# Patient Record
Sex: Male | Born: 1960 | Race: White | Hispanic: No | Marital: Single | Smoking: Former smoker
Health system: Southern US, Community
[De-identification: ages and names within clinical notes are randomized; demographics above are authoritative.]

## PROBLEM LIST (undated history)

## (undated) DIAGNOSIS — I10 Essential (primary) hypertension: Secondary | ICD-10-CM

---

## 1997-12-15 ENCOUNTER — Emergency Department (HOSPITAL_COMMUNITY): Admission: EM | Admit: 1997-12-15 | Discharge: 1997-12-15 | Payer: Self-pay | Admitting: *Deleted

## 1998-02-15 ENCOUNTER — Emergency Department (HOSPITAL_COMMUNITY): Admission: EM | Admit: 1998-02-15 | Discharge: 1998-02-15 | Payer: Self-pay | Admitting: Emergency Medicine

## 1999-05-24 ENCOUNTER — Emergency Department (HOSPITAL_COMMUNITY): Admission: EM | Admit: 1999-05-24 | Discharge: 1999-05-24 | Payer: Self-pay | Admitting: Emergency Medicine

## 1999-05-24 ENCOUNTER — Encounter: Payer: Self-pay | Admitting: Emergency Medicine

## 2000-02-05 ENCOUNTER — Emergency Department (HOSPITAL_COMMUNITY): Admission: EM | Admit: 2000-02-05 | Discharge: 2000-02-05 | Payer: Self-pay | Admitting: Emergency Medicine

## 2000-02-05 ENCOUNTER — Encounter: Payer: Self-pay | Admitting: Emergency Medicine

## 2000-09-07 ENCOUNTER — Emergency Department (HOSPITAL_COMMUNITY): Admission: EM | Admit: 2000-09-07 | Discharge: 2000-09-07 | Payer: Self-pay | Admitting: Emergency Medicine

## 2000-09-07 ENCOUNTER — Encounter: Payer: Self-pay | Admitting: Emergency Medicine

## 2001-03-01 ENCOUNTER — Emergency Department (HOSPITAL_COMMUNITY): Admission: EM | Admit: 2001-03-01 | Discharge: 2001-03-01 | Payer: Self-pay | Admitting: *Deleted

## 2001-03-04 ENCOUNTER — Emergency Department (HOSPITAL_COMMUNITY): Admission: EM | Admit: 2001-03-04 | Discharge: 2001-03-04 | Payer: Self-pay | Admitting: Emergency Medicine

## 2006-12-17 ENCOUNTER — Emergency Department (HOSPITAL_COMMUNITY): Admission: EM | Admit: 2006-12-17 | Discharge: 2006-12-17 | Payer: Self-pay | Admitting: Emergency Medicine

## 2009-02-24 ENCOUNTER — Emergency Department (HOSPITAL_COMMUNITY): Admission: EM | Admit: 2009-02-24 | Discharge: 2009-02-24 | Payer: Self-pay | Admitting: Emergency Medicine

## 2011-05-05 LAB — CBC
HCT: 43.6
Hemoglobin: 14.9
MCHC: 34.2
MCV: 93.5
Platelets: 243
RBC: 4.66
RDW: 13
WBC: 8.9

## 2011-05-05 LAB — DIFFERENTIAL
Basophils Absolute: 0.1
Basophils Relative: 1
Eosinophils Absolute: 0.2
Eosinophils Relative: 2
Lymphocytes Relative: 30
Lymphs Abs: 2.7
Monocytes Absolute: 0.8 — ABNORMAL HIGH
Monocytes Relative: 9
Neutro Abs: 5.2
Neutrophils Relative %: 58

## 2011-05-05 LAB — BASIC METABOLIC PANEL
BUN: 11
CO2: 29
Calcium: 9
Chloride: 104
Creatinine, Ser: 0.64
GFR calc Af Amer: >60
GFR calc non Af Amer: >60
Glucose, Bld: 101 — ABNORMAL HIGH
Potassium: 4.4
Sodium: 139

## 2012-03-07 ENCOUNTER — Emergency Department (HOSPITAL_COMMUNITY)
Admission: EM | Admit: 2012-03-07 | Discharge: 2012-03-07 | Disposition: A | Discharge status: Home / Self Care | Payer: Self-pay | Attending: Emergency Medicine | Admitting: Emergency Medicine

## 2012-03-07 ENCOUNTER — Encounter (HOSPITAL_COMMUNITY): Payer: Self-pay | Admitting: Emergency Medicine

## 2012-03-07 DIAGNOSIS — Z4801 Encounter for change or removal of surgical wound dressing: Secondary | ICD-10-CM | POA: Insufficient documentation

## 2012-03-07 DIAGNOSIS — L0291 Cutaneous abscess, unspecified: Secondary | ICD-10-CM

## 2012-03-07 DIAGNOSIS — F17200 Nicotine dependence, unspecified, uncomplicated: Secondary | ICD-10-CM | POA: Insufficient documentation

## 2012-03-07 MED ORDER — SULFAMETHOXAZOLE-TRIMETHOPRIM 800-160 MG PO TABS: 1.0000 | ORAL_TABLET | Freq: Two times a day (BID) | ORAL | Status: AC

## 2012-03-07 NOTE — ED Notes (Signed)
Pt alert, arrives from home, c/o wound to left FA, onset was several days ago, 3cm raised area, two blacken centers, non open non draining, DSd applied

## 2012-03-07 NOTE — ED Provider Notes (Signed)
History     CSN: [id number]  Arrival date & time 03/07/12  2052   First MD Initiated Contact with Patient 03/07/12 2316      Chief Complaint  Patient presents with  . Wound Check    (Consider location/radiation/quality/duration/timing/severity/associated sxs/prior treatment) HPI Comments: Patient presents with left forearm infection/abscess that began 7 days ago. Patient states that he thinks he was bitten by an insect because he felt a sting at the onset. He did not see a spider or other insect. Patient states that the arm became swollen and tender. He notes that the swelling has improved over the past few days. He denies fever, nausea or vomiting. He states he has been squeezing pus from the area. He has been washing it in warm water in the shower. He is worried that this is a brown recluse bite. No other treatments prior. Onset was acute. Course is gradually improving. Nothing makes symptoms better or worse.  The history is provided by the patient.    History reviewed. No pertinent past medical history.  History reviewed. No pertinent past surgical history.  No family history on file.  History  Substance Use Topics  . Smoking status: Current Everyday Smoker -- 1.0 packs/day    Types: Cigarettes  . Smokeless tobacco: Not on file  . Alcohol Use: No      Review of Systems  Constitutional: Negative for fever.  Gastrointestinal: Negative for nausea and vomiting.  Skin: Negative for color change.       Positive for abscess  Hematological: Negative for adenopathy.    Allergies  Codeine  Home Medications  No current outpatient prescriptions on file.  BP 114/80  Pulse 81  Temp 98.4 F (36.9 C) (Oral)  Resp 18  SpO2 96%  Physical Exam  Nursing note and vitals reviewed. Constitutional: He appears well-developed and well-nourished.  HENT:  Head: Normocephalic and atraumatic.  Eyes: Conjunctivae are normal.  Neck: Normal range of motion. Neck supple.    Cardiovascular:  Pulses:      Radial pulses are 2+ on the right side, and 2+ on the left side.  Pulmonary/Chest: No respiratory distress.  Musculoskeletal:       Normal sensation and movement in left wrist and hand.  Neurological: He is alert.  Skin: Skin is warm and dry.       3 cm area and induration to the left forearm with mild overlying redness but no surrounding redness that would be consistent with cellulitis. There is crusting but no drainage. Area is mildly tender to palpation. There is  eschar in the center.  Psychiatric: He has a normal mood and affect.    ED Course  Procedures (including critical care time)  Labs Reviewed - No data to display No results found.   1. Abscess     11:36 PM Patient seen and examined.    Vital signs reviewed and are as follows: Filed Vitals:   03/07/12 2312  BP: 114/80  Pulse: 81  Temp: 98.4 F (36.9 C)  Resp: 18   11:36 PM The patient was urged to return to the Emergency Department urgently with worsening pain, swelling, expanding erythema especially if it streaks away from the affected area, fever, or if they have any other concerns. Patient verbalized understanding.     MDM  Abscess already draining, does not feel fluctuant. No cellulitis. No systemic sx. Will treat with course of bactrim. No cytotoxic response noted.         Renne Crigler,  PA 03/07/12 2343

## 2012-03-08 NOTE — ED Provider Notes (Signed)
Medical screening examination/treatment/procedure(s) were performed by non-physician practitioner and as supervising physician I was immediately available for consultation/collaboration.  Andry Bogden, MD 03/08/12 0908 

## 2012-07-16 ENCOUNTER — Emergency Department (HOSPITAL_COMMUNITY): Payer: Self-pay

## 2012-07-16 ENCOUNTER — Encounter (HOSPITAL_COMMUNITY): Payer: Self-pay | Admitting: *Deleted

## 2012-07-16 ENCOUNTER — Other Ambulatory Visit: Payer: Self-pay

## 2012-07-16 ENCOUNTER — Inpatient Hospital Stay (HOSPITAL_COMMUNITY)
Admission: EM | Admit: 2012-07-16 | Discharge: 2012-07-22 | DRG: 194 | Disposition: A | Discharge status: Home / Self Care | Payer: MEDICAID | Attending: Internal Medicine | Admitting: Internal Medicine

## 2012-07-16 DIAGNOSIS — F1210 Cannabis abuse, uncomplicated: Secondary | ICD-10-CM | POA: Diagnosis present

## 2012-07-16 DIAGNOSIS — E871 Hypo-osmolality and hyponatremia: Secondary | ICD-10-CM

## 2012-07-16 DIAGNOSIS — F10939 Alcohol use, unspecified with withdrawal, unspecified: Secondary | ICD-10-CM | POA: Diagnosis not present

## 2012-07-16 DIAGNOSIS — T510X4A Toxic effect of ethanol, undetermined, initial encounter: Secondary | ICD-10-CM | POA: Diagnosis present

## 2012-07-16 DIAGNOSIS — R51 Headache: Secondary | ICD-10-CM | POA: Diagnosis not present

## 2012-07-16 DIAGNOSIS — D6959 Other secondary thrombocytopenia: Secondary | ICD-10-CM | POA: Diagnosis present

## 2012-07-16 DIAGNOSIS — F17200 Nicotine dependence, unspecified, uncomplicated: Secondary | ICD-10-CM | POA: Diagnosis present

## 2012-07-16 DIAGNOSIS — R7881 Bacteremia: Secondary | ICD-10-CM | POA: Diagnosis present

## 2012-07-16 DIAGNOSIS — I10 Essential (primary) hypertension: Secondary | ICD-10-CM | POA: Diagnosis present

## 2012-07-16 DIAGNOSIS — J154 Pneumonia due to other streptococci: Principal | ICD-10-CM | POA: Diagnosis present

## 2012-07-16 DIAGNOSIS — IMO0001 Reserved for inherently not codable concepts without codable children: Secondary | ICD-10-CM | POA: Diagnosis present

## 2012-07-16 DIAGNOSIS — R079 Chest pain, unspecified: Secondary | ICD-10-CM

## 2012-07-16 DIAGNOSIS — F1010 Alcohol abuse, uncomplicated: Secondary | ICD-10-CM | POA: Diagnosis present

## 2012-07-16 DIAGNOSIS — B953 Streptococcus pneumoniae as the cause of diseases classified elsewhere: Secondary | ICD-10-CM

## 2012-07-16 DIAGNOSIS — F1410 Cocaine abuse, uncomplicated: Secondary | ICD-10-CM | POA: Diagnosis present

## 2012-07-16 DIAGNOSIS — T65891A Toxic effect of other specified substances, accidental (unintentional), initial encounter: Secondary | ICD-10-CM | POA: Diagnosis present

## 2012-07-16 DIAGNOSIS — F1020 Alcohol dependence, uncomplicated: Secondary | ICD-10-CM | POA: Diagnosis present

## 2012-07-16 DIAGNOSIS — F10239 Alcohol dependence with withdrawal, unspecified: Secondary | ICD-10-CM | POA: Diagnosis not present

## 2012-07-16 DIAGNOSIS — K5900 Constipation, unspecified: Secondary | ICD-10-CM | POA: Diagnosis not present

## 2012-07-16 DIAGNOSIS — J189 Pneumonia, unspecified organism: Secondary | ICD-10-CM

## 2012-07-16 DIAGNOSIS — R000 Tachycardia, unspecified: Secondary | ICD-10-CM | POA: Diagnosis present

## 2012-07-16 LAB — RAPID URINE DRUG SCREEN, HOSP PERFORMED
Barbiturates: NOT DETECTED
Benzodiazepines: NOT DETECTED
Cocaine: POSITIVE — AB
Opiates: NOT DETECTED
Tetrahydrocannabinol: POSITIVE — AB

## 2012-07-16 LAB — BASIC METABOLIC PANEL
BUN: 30 mg/dL — ABNORMAL HIGH (ref 6–23)
Creatinine, Ser: 0.93 mg/dL (ref 0.50–1.35)
GFR calc non Af Amer: >90 mL/min (ref >90)
Glucose, Bld: 126 mg/dL — ABNORMAL HIGH (ref 70–99)
Potassium: 3.8 mEq/L (ref 3.5–5.1)

## 2012-07-16 LAB — CBC
HCT: 40.6 % (ref 39.0–52.0)
Hemoglobin: 14.3 g/dL (ref 13.0–17.0)
MCHC: 35.2 g/dL (ref 30.0–36.0)
MCV: 92.3 fL (ref 78.0–100.0)

## 2012-07-16 LAB — CREATININE, URINE, RANDOM: Creatinine, Urine: 116.7 mg/dL

## 2012-07-16 LAB — HIV ANTIBODY (ROUTINE TESTING W REFLEX): HIV: NONREACTIVE

## 2012-07-16 LAB — OSMOLALITY, URINE: Osmolality, Ur: 780 mOsm/kg (ref 390–1090)

## 2012-07-16 MED ORDER — DEXTROSE 5 % IV SOLN
1.0000 g | INTRAVENOUS | Status: DC
  Administered 2012-07-17 – 2012-07-19 (×3): 1 g via INTRAVENOUS
  Filled 2012-07-16 (×3): qty 10

## 2012-07-16 MED ORDER — AZITHROMYCIN 500 MG PO TABS
500.0000 mg | ORAL_TABLET | ORAL | Status: DC
  Administered 2012-07-17 – 2012-07-18 (×2): 500 mg via ORAL
  Filled 2012-07-16 (×3): qty 1

## 2012-07-16 MED ORDER — IOHEXOL 350 MG/ML SOLN
100.0000 mL | Freq: Once | INTRAVENOUS | Status: AC | PRN
  Administered 2012-07-16: 100 mL via INTRAVENOUS

## 2012-07-16 MED ORDER — NICOTINE 14 MG/24HR TD PT24
14.0000 mg | MEDICATED_PATCH | Freq: Every day | TRANSDERMAL | Status: DC
  Administered 2012-07-16 – 2012-07-21 (×6): 14 mg via TRANSDERMAL
  Filled 2012-07-16 (×7): qty 1

## 2012-07-16 MED ORDER — HYDROMORPHONE HCL PF 1 MG/ML IJ SOLN
1.0000 mg | Freq: Once | INTRAMUSCULAR | Status: AC
  Administered 2012-07-16: 1 mg via INTRAVENOUS
  Filled 2012-07-16: qty 1

## 2012-07-16 MED ORDER — VITAMIN B-1 100 MG PO TABS
100.0000 mg | ORAL_TABLET | Freq: Every day | ORAL | Status: DC
  Administered 2012-07-16 – 2012-07-22 (×7): 100 mg via ORAL
  Filled 2012-07-16 (×7): qty 1

## 2012-07-16 MED ORDER — PNEUMOCOCCAL VAC POLYVALENT 25 MCG/0.5ML IJ INJ
0.5000 mL | INJECTION | Freq: Once | INTRAMUSCULAR | Status: AC
  Administered 2012-07-16: 0.5 mL via INTRAMUSCULAR
  Filled 2012-07-16: qty 0.5

## 2012-07-16 MED ORDER — TRAMADOL HCL 50 MG PO TABS
100.0000 mg | ORAL_TABLET | Freq: Four times a day (QID) | ORAL | Status: DC | PRN
  Administered 2012-07-16 – 2012-07-21 (×2): 100 mg via ORAL
  Filled 2012-07-16: qty 1
  Filled 2012-07-16: qty 2
  Filled 2012-07-16: qty 1

## 2012-07-16 MED ORDER — DEXTROSE 5 % IV SOLN
1.0000 g | Freq: Once | INTRAVENOUS | Status: AC
  Administered 2012-07-16: 1 g via INTRAVENOUS
  Filled 2012-07-16: qty 10

## 2012-07-16 MED ORDER — SODIUM CHLORIDE 0.9 % IV SOLN
INTRAVENOUS | Status: DC
  Administered 2012-07-16: 06:00:00 via INTRAVENOUS
  Administered 2012-07-16: 100 mL/h via INTRAVENOUS
  Administered 2012-07-17: 09:00:00 via INTRAVENOUS
  Administered 2012-07-18: 20 mL/h via INTRAVENOUS
  Administered 2012-07-18: 06:00:00 via INTRAVENOUS
  Administered 2012-07-19: 20 mL/h via INTRAVENOUS

## 2012-07-16 MED ORDER — INFLUENZA VIRUS VACC SPLIT PF IM SUSP
0.5000 mL | Freq: Once | INTRAMUSCULAR | Status: AC
  Administered 2012-07-16: 0.5 mL via INTRAMUSCULAR
  Filled 2012-07-16: qty 0.5

## 2012-07-16 MED ORDER — HYDROMORPHONE HCL PF 1 MG/ML IJ SOLN
1.0000 mg | INTRAMUSCULAR | Status: DC | PRN
  Administered 2012-07-16 – 2012-07-18 (×4): 1 mg via INTRAVENOUS
  Filled 2012-07-16 (×4): qty 1

## 2012-07-16 MED ORDER — KETOROLAC TROMETHAMINE 30 MG/ML IJ SOLN
30.0000 mg | Freq: Three times a day (TID) | INTRAMUSCULAR | Status: DC
  Administered 2012-07-16 – 2012-07-19 (×9): 30 mg via INTRAVENOUS
  Filled 2012-07-16 (×15): qty 1

## 2012-07-16 MED ORDER — ENOXAPARIN SODIUM 40 MG/0.4ML ~~LOC~~ SOLN
40.0000 mg | SUBCUTANEOUS | Status: DC
  Administered 2012-07-16 – 2012-07-22 (×7): 40 mg via SUBCUTANEOUS
  Filled 2012-07-16 (×7): qty 0.4

## 2012-07-16 MED ORDER — ONDANSETRON HCL 4 MG/2ML IJ SOLN
4.0000 mg | Freq: Once | INTRAMUSCULAR | Status: AC
  Administered 2012-07-16: 4 mg via INTRAVENOUS
  Filled 2012-07-16: qty 2

## 2012-07-16 MED ORDER — DEXTROSE 5 % IV SOLN
500.0000 mg | Freq: Once | INTRAVENOUS | Status: AC
  Administered 2012-07-16: 500 mg via INTRAVENOUS
  Filled 2012-07-16: qty 500

## 2012-07-16 MED ORDER — FOLIC ACID 1 MG PO TABS
1.0000 mg | ORAL_TABLET | Freq: Every day | ORAL | Status: DC
  Administered 2012-07-16 – 2012-07-22 (×7): 1 mg via ORAL
  Filled 2012-07-16 (×7): qty 1

## 2012-07-16 NOTE — ED Provider Notes (Signed)
History     CSN: [id number]  Arrival date & time 07/16/12  0108   First MD Initiated Contact with Patient 07/16/12 0121      Chief Complaint  Patient presents with  . Shortness of Breath    (Consider location/radiation/quality/duration/timing/severity/associated sxs/prior treatment) Patient is a 51 y.o. male presenting with shortness of breath.  Shortness of Breath  Associated symptoms include shortness of breath.  Christopher Logan is a 51 y.o. male presenting with severe left sided chest pain worse on deep breathing that began yesterday and has gotten worse.  Began with cough. Has had cough productive of white to yellow sputum.  Chills.  Denies dyspnea.  No abdominal pain, nausea, vomiting or diarrhea. No headache.  History reviewed. No pertinent past medical history.  History reviewed. No pertinent past surgical history.  Family History  Problem Relation Age of Onset  . Cancer Mother     History  Substance Use Topics  . Smoking status: Current Every Day Smoker -- 1.0 packs/day    Types: Cigarettes  . Smokeless tobacco: Not on file  . Alcohol Use: Yes     Comment: 40 oz of beer a day.       Review of Systems  Respiratory: Positive for shortness of breath.    At least 10pt or greater review of systems completed and are negative except where specified in the HPI.  Allergies  Codeine  Home Medications  No current outpatient prescriptions on file.  BP 139/85  Pulse 92  Temp 98.9 F (37.2 C) (Oral)  Resp 20  Ht 5\' 10"  (1.778 m)  Wt 130 lb 1.1 oz (59 kg)  BMI 18.66 kg/m2  SpO2 97%  Physical Exam  Nursing notes reviewed.  Electronic medical record reviewed. VITAL SIGNS:   Filed Vitals:   07/16/12 0309 07/16/12 0528 07/16/12 1437 07/16/12 2033  BP: 132/81 143/94 122/81 139/85  Pulse: 101 106 86 92  Temp: 98.5 F (36.9 C)  98.3 F (36.8 C) 98.9 F (37.2 C)  TempSrc: Oral Oral Oral Oral  Resp: 26 20 20 20   Height:  5\' 10"  (1.778 m)    Weight:  130 lb  1.1 oz (59 kg)    SpO2: 98% 96% 98% 97%   CONSTITUTIONAL: Awake, oriented, appears non-toxic HENT: Atraumatic, normocephalic, oral mucosa pink and moist, airway patent. Nares patent without drainage. External ears normal. EYES: Conjunctiva clear, EOMI, PERRLA NECK: Trachea midline, non-tender, supple CARDIOVASCULAR: Normal heart rate, Normal rhythm, No murmurs, rubs, gallops PULMONARY/CHEST: Clear to auscultation, no rhonchi, wheezes, or rales. Symmetrical breath sounds. Non-tender. ABDOMINAL: Non-distended, soft, non-tender - no rebound or guarding.  BS normal. NEUROLOGIC: Non-focal, moving all four extremities, no gross sensory or motor deficits. EXTREMITIES: No clubbing, cyanosis, or edema SKIN: Warm, Dry, No erythema, No rash  ED Course  [place] bedside Performed by: Jones Skene Authorized by: Jones Skene Consent: Verbal consent obtained. Comments: SSliding lung sign seen on both sided of chest in mid-clavicular line at the 2nd rib.  M-modes shows "sand and surf" sign. No PTX   (including critical care time)  Labs Reviewed  CBC - Abnormal; Notable for the following:    WBC 15.7 (*)     All other components within normal limits  BASIC METABOLIC PANEL - Abnormal; Notable for the following:    Sodium 131 (*)     Chloride 95 (*)     Glucose, Bld 126 (*)     BUN 30 (*)     All other components  within normal limits  STREP PNEUMONIAE URINARY ANTIGEN - Abnormal; Notable for the following:    Strep Pneumo Urinary Antigen POSITIVE (*)     All other components within normal limits  URINE RAPID DRUG SCREEN (HOSP PERFORMED) - Abnormal; Notable for the following:    Cocaine POSITIVE (*)     Tetrahydrocannabinol POSITIVE (*)     All other components within normal limits  TROPONIN I  TROPONIN I  SODIUM, URINE, RANDOM  CREATININE, URINE, RANDOM  OSMOLALITY, URINE  HIV ANTIBODY (ROUTINE TESTING)  CULTURE, BLOOD (ROUTINE X 2)  CULTURE, BLOOD (ROUTINE X 2)  CULTURE, EXPECTORATED  SPUTUM-ASSESSMENT  GRAM STAIN  LEGIONELLA ANTIGEN, URINE  COMPREHENSIVE METABOLIC PANEL  CBC WITH DIFFERENTIAL   Ct Angio Chest W/cm &/or Wo Cm  07/16/2012  *RADIOLOGY REPORT*  Clinical Data: Sudden onset of left-sided chest pain.  Cough. Leukocytosis.  History of smoking.  CT ANGIOGRAPHY CHEST  Technique:  Multidetector CT imaging of the chest using the standard protocol during bolus administration of intravenous contrast. Multiplanar reconstructed images including MIPs were obtained and reviewed to evaluate the vascular anatomy.  Contrast: OMNIPAQUE IOHEXOL 350 MG/ML SOLN  Comparison: Chest radiograph performed earlier today at 01:28 a.m.  Findings: There is no evidence of pulmonary embolus.  There is focal airspace opacification involving the posterior aspect of the left upper lobe, with minimal patchy opacity at the left lower lobe, compatible with multifocal pneumonia.  Minimal right basilar atelectasis is seen.  There is no evidence of pleural effusion or pneumothorax.  No masses are identified; no abnormal focal contrast enhancement is seen.  Scattered coronary artery calcification is noted.  Vague soft tissue density is noted in the subcarinal region, measuring 1.2 cm in short axis.  A 1.1 cm aortopulmonary window node is seen. Additional scattered mediastinal nodes remain borderline normal in size. No pericardial effusion is identified.  The great vessels demonstrate mild scattered calcification, but are otherwise grossly unremarkable.  No axillary lymphadenopathy is seen.  The visualized portions of the thyroid gland are unremarkable in appearance.  The visualized portions of the liver and spleen are unremarkable.  No acute osseous abnormalities are seen.  IMPRESSION:  1.  No evidence of pulmonary embolus. 2.  Focal left upper lobe and minimal left lower lobe pneumonia. 3.  Minimal right basilar atelectasis seen. 4.  Scattered coronary artery calcification noted. 5.  Vague soft tissue density  at the subcarinal region, measuring 1.2 cm in short axis, and 1.1 cm aortopulmonary window node.  This may reflect the acute infectious process, but remains nonspecific.   Original Report Authenticated By: Tonia Ghent, M.D.    Dg Chest Portable 1 View  07/16/2012  *RADIOLOGY REPORT*  Clinical Data: Shortness of breath.  Chest pain.  PORTABLE CHEST - 1 VIEW  Comparison: None.  Findings: Asymmetric airspace opacity is seen in the peripheral left midlung.  Differential diagnosis includes pneumonia, hemorrhage, and infarct.  Right lung is clear.  No evidence of pleural effusion.  Heart size is normal.  IMPRESSION:  Left mid lung airspace opacity.  Differential diagnosis includes pneumonia, hemorrhage, and infarct.   Original Report Authenticated By: Myles Rosenthal, M.D.      1. CAP (community acquired pneumonia)   2. Chest pain   3. Hyponatremia       MDM  Christopher Logan is a 52 y.o. male presents with apparent PNA. PTX ruled out on physical and with [place].  Pt had possible infarct on CXR  -concern for PE also,  CT showed no PE but left lung PNA.  Therapy started for CAP.  Pt meets SIRS criteria - concern for effective therapy as an OP given smoking etc - admitted to [facility] for CAP        Jones Skene, MD 07/16/12 2330

## 2012-07-16 NOTE — Progress Notes (Signed)
ANTIBIOTIC CONSULT NOTE - INITIAL  Pharmacy Consult for antibiotic renal monitoring Indication: pneumonia  Allergies  Allergen Reactions  . Codeine Nausea Only    Patient Measurements: Height: 5\' 10"  (177.8 cm) Weight: 130 lb 1.1 oz (59 kg) IBW/kg (Calculated) : 73  Adjusted Body Weight:   Vital Signs: Temp: 98 F (36.7 C) (12/30 0528) Temp src: Oral (12/30 0528) BP: 143/94 mmHg (12/30 0528) Pulse Rate: 106  (12/30 0528) Intake/Output from previous day:   Intake/Output from this shift:    Labs:  [facility] 07/16/12 0123  WBC 15.7*  HGB 14.3  PLT 181  LABCREA --  CREATININE 0.93   Estimated Creatinine Clearance: 79.3 ml/min (by C-G formula based on Cr of 0.93). No results found for this basename: VANCOTROUGH:2,VANCOPEAK:2,VANCORANDOM:2,GENTTROUGH:2,GENTPEAK:2,GENTRANDOM:2,TOBRATROUGH:2,TOBRAPEAK:2,TOBRARND:2,AMIKACINPEAK:2,AMIKACINTROU:2,AMIKACIN:2, in the last 72 hours   Microbiology: No results found for this or any previous visit (from the past 720 hour(s)).  Medical History: History reviewed. No pertinent past medical history.  Medications:  Anti-infectives     Start     Dose/Rate Route Frequency Ordered Stop   07/17/12 0600   cefTRIAXone (ROCEPHIN) 1 g in dextrose 5 % 50 mL IVPB        1 g 100 mL/hr over 30 Minutes Intravenous Every 24 hours 07/16/12 0520 07/24/12 0559   07/17/12 0600   azithromycin (ZITHROMAX) tablet 500 mg        500 mg Oral Every 24 hours 07/16/12 0520 07/24/12 0559   07/16/12 0315   cefTRIAXone (ROCEPHIN) 1 g in dextrose 5 % 50 mL IVPB        1 g 100 mL/hr over 30 Minutes Intravenous  Once 07/16/12 0301 07/16/12 0502   07/16/12 0315   azithromycin (ZITHROMAX) 500 mg in dextrose 5 % 250 mL IVPB        500 mg 250 mL/hr over 60 Minutes Intravenous  Once 07/16/12 0301 07/16/12 0601         Assessment: Patient with good renal function ~84mL/min, on correctly dosed rocephin/azithromycin.  Goal of Therapy:  Rocephin/azithromycin  based on manufacturer dosing recommendations.   Plan:  Follow up culture results Continue with current abx dosing  Aleene Davidson Crowford 07/16/2012,6:11 AM

## 2012-07-16 NOTE — ED Notes (Signed)
Pt presents clinching left chest; yelling out in pain; states has had cough/cold since yesterday; developed sudden onset severe left chest pain two hours prior to arrival; sats 90% on arrival;

## 2012-07-16 NOTE — H&P (Signed)
PCP:  None   Chief Complaint:  Chest pain  HPI: Christopher Logan is a 51 y.o. male   has no past medical history on file.   Presented with  1 day hx of pleuritic chest pain and cough (non-productive). He presented to ED with chest pain and was found to have PNA. Patient denies any fevers. He denies sick contacts. Reports using cocaine 3 days ago. He reports drinking 40 Oz of beer a day but denies any hx of EtOH withdrawal, denies ever being tremulous if he does not drink.  Review of Systems:     Pertinent positives include: chest pain, shortness of breath at rest,  non-productive cough, nausea, vomiting,   Constitutional:  No weight loss, night sweats, Fevers, chills, fatigue, weight loss  HEENT:  No headaches, Difficulty swallowing,Tooth/dental problems,Sore throat,  No sneezing, itching, ear ache, nasal congestion, post nasal drip,  Cardio-vascular:  NoOrthopnea, PND, anasarca, dizziness, palpitations.no Bilateral lower extremity swelling  GI:  No heartburn, indigestion, abdominal pain, diarrhea, change in bowel habits, loss of appetite, melena, blood in stool, hematemesis Resp:  no . No dyspnea on exertion, No excess mucus, No coughing up of blood.No change in color of mucus.No wheezing. Skin:  no rash or lesions. No jaundice GU:  no dysuria, change in color of urine, no urgency or frequency. No straining to urinate.  No flank pain.  Musculoskeletal:  No joint pain or no joint swelling. No decreased range of motion. No back pain.  Psych:  No change in mood or affect. No depression or anxiety. No memory loss.  Neuro: no localizing neurological complaints, no tingling, no weakness, no double vision, no gait abnormality, no slurred speech, no confusion  Otherwise ROS are negative except for above, 10 systems were reviewed  Past Medical History: History reviewed. No pertinent past medical history. History reviewed. No pertinent past surgical history.   Medications: Prior  to Admission medications   Medication Sig Start Date End Date Taking? Authorizing Provider  Aspirin-Acetaminophen (GOODYS BODY PAIN PO) Take 1 Package by mouth daily as needed. For pain   Yes Historical Provider, MD    Allergies:   Allergies  Allergen Reactions  . Codeine Nausea Only    Social History:  Ambulatory  Independently  Lives at  home   reports that he has been smoking Cigarettes.  He has been smoking about 1 pack per day. He does not have any smokeless tobacco history on file. He reports that he drinks alcohol. He reports that he uses illicit drugs (Marijuana and Cocaine).   Family History: family history includes Cancer in his mother.    Physical Exam: Patient Vitals for the past 24 hrs:  BP Temp Temp src Pulse Resp SpO2  07/16/12 0309 132/81 mmHg 98.5 F (36.9 C) Oral 101  26  98 %  07/16/12 0110 151/98 mmHg 98.1 F (36.7 C) - 113  24  90 %    1. General:  in No Acute distress 2. Psychological: Alert and   Oriented 3. Head/ENT:   Moist  Mucous Membranes                          Head Non traumatic, neck supple                          Normal   Dentition 4. SKIN:  decreased Skin turgor,  Skin clean Dry and intact no rash 5. Heart:  Regular rate and rhythm no Murmur, Rub or gallop 6. Lungs:  no wheezes or crackles  But patient unable to inhale deeply due to pain. 7. Abdomen: Soft, non-tender, Non distended 8. Lower extremities: no clubbing, cyanosis, or edema 9. Neurologically Grossly intact, moving all 4 extremities equally 10. MSK: Normal range of motion  body mass index is unknown because there is no height or weight on file.   Labs on Admission:   [facility] 07/16/12 0123  NA 131*  K 3.8  CL 95*  CO2 25  GLUCOSE 126*  BUN 30*  CREATININE 0.93  CALCIUM 9.0  MG --  PHOS --   No results found for this basename: AST:2,ALT:2,ALKPHOS:2,BILITOT:2,PROT:2,ALBUMIN:2 in the last 72 hours No results found for this basename: LIPASE:2,AMYLASE:2 in the last  72 hours  Basename 07/16/12 0123  WBC 15.7*  NEUTROABS --  HGB 14.3  HCT 40.6  MCV 92.3  PLT 181    Basename 07/16/12 0123  CKTOTAL --  CKMB --  CKMBINDEX --  TROPONINI <0.30   No results found for this basename: TSH,T4TOTAL,FREET3,T3FREE,THYROIDAB in the last 72 hours No results found for this basename: VITAMINB12:2,FOLATE:2,FERRITIN:2,TIBC:2,IRON:2,RETICCTPCT:2 in the last 72 hours No results found for this basename: HGBA1C    CrCl is unknown because there is no height on file for the current visit. ABG No results found for this basename: phart, pco2, po2, hco3, tco2, acidbasedef, o2sat     No results found for this basename: DDIMER     Other results:  I have pearsonaly reviewed this: ECG REPORT  Rate: 105  Rhythm: sinus tachycardia ST&T Change: no ischemic changes   Cultures: No results found for this basename: sdes, specrequest, cult, reptstatus       Radiological Exams on Admission: Ct Angio Chest W/cm &/or Wo Cm  07/16/2012  *RADIOLOGY REPORT*  Clinical Data: Sudden onset of left-sided chest pain.  Cough. Leukocytosis.  History of smoking.  CT ANGIOGRAPHY CHEST  Technique:  Multidetector CT imaging of the chest using the standard protocol during bolus administration of intravenous contrast. Multiplanar reconstructed images including MIPs were obtained and reviewed to evaluate the vascular anatomy.  Contrast: OMNIPAQUE IOHEXOL 350 MG/ML SOLN  Comparison: Chest radiograph performed earlier today at 01:28 a.m.  Findings: There is no evidence of pulmonary embolus.  There is focal airspace opacification involving the posterior aspect of the left upper lobe, with minimal patchy opacity at the left lower lobe, compatible with multifocal pneumonia.  Minimal right basilar atelectasis is seen.  There is no evidence of pleural effusion or pneumothorax.  No masses are identified; no abnormal focal contrast enhancement is seen.  Scattered coronary artery calcification is  noted.  Vague soft tissue density is noted in the subcarinal region, measuring 1.2 cm in short axis.  A 1.1 cm aortopulmonary window node is seen. Additional scattered mediastinal nodes remain borderline normal in size. No pericardial effusion is identified.  The great vessels demonstrate mild scattered calcification, but are otherwise grossly unremarkable.  No axillary lymphadenopathy is seen.  The visualized portions of the thyroid gland are unremarkable in appearance.  The visualized portions of the liver and spleen are unremarkable.  No acute osseous abnormalities are seen.  IMPRESSION:  1.  No evidence of pulmonary embolus. 2.  Focal left upper lobe and minimal left lower lobe pneumonia. 3.  Minimal right basilar atelectasis seen. 4.  Scattered coronary artery calcification noted. 5.  Vague soft tissue density at the subcarinal region, measuring 1.2 cm in short axis, and  1.1 cm aortopulmonary window node.  This may reflect the acute infectious process, but remains nonspecific.   Original Report Authenticated By: Tonia Ghent, M.D.    Dg Chest Portable 1 View  07/16/2012  *RADIOLOGY REPORT*  Clinical Data: Shortness of breath.  Chest pain.  PORTABLE CHEST - 1 VIEW  Comparison: None.  Findings: Asymmetric airspace opacity is seen in the peripheral left midlung.  Differential diagnosis includes pneumonia, hemorrhage, and infarct.  Right lung is clear.  No evidence of pleural effusion.  Heart size is normal.  IMPRESSION:  Left mid lung airspace opacity.  Differential diagnosis includes pneumonia, hemorrhage, and infarct.   Original Report Authenticated By: Myles Rosenthal, M.D.     Chart has been reviewed  Assessment/Plan  [age] yo M w hx of substance abuse here with pleuretic chest pain and evidence of PNA on CXR.  Present on Admission:  . CAP (community acquired pneumonia) -  - will admit for treatment of CAP will start on appropriate antibiotic coverage.   Obtain sputum cultures, blood cultures if  febrile or if decompensates.  Provide oxygen as needed.  HIV testing . Chest pain - -given risk factors will  monitor on telemetry, cycle cardiac enzymes, obtain serial ECG. Most likley chest pain is due to PNA.  Marland Kitchen Hyponatremia - likely in the setting of PNA, EtOH use and dehydration. Will obtain urine electrolytes and follow  Na.    Prophylaxis:Lovenox, Protonix  CODE STATUS: FULL CODE  Other plan as per orders.  I have spent a total of 55 min on this admission  Jeramie Scogin 07/16/2012, 3:42 AM

## 2012-07-16 NOTE — Progress Notes (Signed)
[facility] PROGRESS NOTE  DWIGHT ADAMCZAK JXB:[id number] DOB: 1960/08/21 DOA: 07/16/2012 PCP: No primary provider on file.  Assessment/Plan: 1. Pneumonia - complicating features on admission are chest pain, alcoholism, tobacco abuse,  hyponatremia , tachycardia, tachypnea. Patient started on iv rocephin and zithromax 2. Tobacco abuse - counseled 3. Alcohol abuse counseled . Started folic acid and thiamine on admission.  4. Pleuritic chest pain - suspect patient will develop para pneumonic effussion - f/u CXR closely. toradol iv for pain 5. HTN - from pain   6. Hyponatremia - probably siadh due to pain   Code Status: full  Family Communication: patient Disposition Plan: home   Consultants:  none  Procedures:  none  Antibiotics: Rocephin and Zithromax 12/30  HPI/Subjective: C/o severe pleuritic type chest pain   Objective: Filed Vitals:   07/16/12 0110 07/16/12 0309 07/16/12 0528  BP: 151/98 132/81 143/94  Pulse: 113 101 106  Temp: 98.1 F (36.7 C) 98.5 F (36.9 C)   TempSrc:  Oral Oral  Resp: 24 26 20   Height:   5\' 10"  (1.778 m)  Weight:   59 kg (130 lb 1.1 oz)  SpO2: 90% 98% 96%    Intake/Output Summary (Last 24 hours) at 07/16/12 1201 Last data filed at 07/16/12 1100  Gross per 24 hour  Intake    120 ml  Output    100 ml  Net     20 ml   Filed Weights   07/16/12 0528  Weight: 59 kg (130 lb 1.1 oz)    Exam:   General:  axox3  Cardiovascular: tachy, reg, no murmurs   Respiratory: decreased sounds left base, no wheezes, barrel shaped chest   Abdomen: soft, nt   Data Reviewed: Basic Metabolic Panel:  Lab 07/16/12 1308  NA 131*  K 3.8  CL 95*  CO2 25  GLUCOSE 126*  BUN 30*  CREATININE 0.93  CALCIUM 9.0  MG --  PHOS --   Liver Function Tests: No results found for this basename: AST:5,ALT:5,ALKPHOS:5,BILITOT:5,PROT:5,ALBUMIN:5 in the last 168 hours No results found for this basename: LIPASE:5,AMYLASE:5 in the last 168 hours No  results found for this basename: AMMONIA:5 in the last 168 hours CBC:  Lab 07/16/12 0123  WBC 15.7*  NEUTROABS --  HGB 14.3  HCT 40.6  MCV 92.3  PLT 181   Cardiac Enzymes:  Lab 07/16/12 0745 07/16/12 0123  CKTOTAL -- --  CKMB -- --  CKMBINDEX -- --  TROPONINI <0.30 <0.30   BNP (last 3 results) No results found for this basename: PROBNP:3 in the last 8760 hours CBG: No results found for this basename: GLUCAP:5 in the last 168 hours  No results found for this or any previous visit (from the past 240 hour(s)).   Studies: Ct Angio Chest W/cm &/or Wo Cm  07/16/2012  *RADIOLOGY REPORT*  Clinical Data: Sudden onset of left-sided chest pain.  Cough. Leukocytosis.  History of smoking.  CT ANGIOGRAPHY CHEST  Technique:  Multidetector CT imaging of the chest using the standard protocol during bolus administration of intravenous contrast. Multiplanar reconstructed images including MIPs were obtained and reviewed to evaluate the vascular anatomy.  Contrast: OMNIPAQUE IOHEXOL 350 MG/ML SOLN  Comparison: Chest radiograph performed earlier today at 01:28 a.m.  Findings: There is no evidence of pulmonary embolus.  There is focal airspace opacification involving the posterior aspect of the left upper lobe, with minimal patchy opacity at the left lower lobe, compatible with multifocal pneumonia.  Minimal right basilar atelectasis is  seen.  There is no evidence of pleural effusion or pneumothorax.  No masses are identified; no abnormal focal contrast enhancement is seen.  Scattered coronary artery calcification is noted.  Vague soft tissue density is noted in the subcarinal region, measuring 1.2 cm in short axis.  A 1.1 cm aortopulmonary window node is seen. Additional scattered mediastinal nodes remain borderline normal in size. No pericardial effusion is identified.  The great vessels demonstrate mild scattered calcification, but are otherwise grossly unremarkable.  No axillary lymphadenopathy is  seen.  The visualized portions of the thyroid gland are unremarkable in appearance.  The visualized portions of the liver and spleen are unremarkable.  No acute osseous abnormalities are seen.  IMPRESSION:  1.  No evidence of pulmonary embolus. 2.  Focal left upper lobe and minimal left lower lobe pneumonia. 3.  Minimal right basilar atelectasis seen. 4.  Scattered coronary artery calcification noted. 5.  Vague soft tissue density at the subcarinal region, measuring 1.2 cm in short axis, and 1.1 cm aortopulmonary window node.  This may reflect the acute infectious process, but remains nonspecific.   Original Report Authenticated By: Tonia Ghent, M.D.    Dg Chest Portable 1 View  07/16/2012  *RADIOLOGY REPORT*  Clinical Data: Shortness of breath.  Chest pain.  PORTABLE CHEST - 1 VIEW  Comparison: None.  Findings: Asymmetric airspace opacity is seen in the peripheral left midlung.  Differential diagnosis includes pneumonia, hemorrhage, and infarct.  Right lung is clear.  No evidence of pleural effusion.  Heart size is normal.  IMPRESSION:  Left mid lung airspace opacity.  Differential diagnosis includes pneumonia, hemorrhage, and infarct.   Original Report Authenticated By: Myles Rosenthal, M.D.     Scheduled Meds:    . azithromycin  500 mg Oral Q24H  . cefTRIAXone (ROCEPHIN)  IV  1 g Intravenous Q24H  . enoxaparin (LOVENOX) injection  40 mg Subcutaneous Q24H  . ketorolac  30 mg Intravenous Q8H   Continuous Infusions:    . sodium chloride 100 mL/hr (07/16/12 1045)    Active Problems:  CAP (community acquired pneumonia)  Chest pain  Hyponatremia      Bill Mcvey  [facility] Pager [phone]. If 8PM-8AM, please contact night-coverage at [email or web address], password [facility] 07/16/2012, 12:01 PM  LOS: 0 days

## 2012-07-17 ENCOUNTER — Inpatient Hospital Stay (HOSPITAL_COMMUNITY): Payer: Self-pay

## 2012-07-17 DIAGNOSIS — R7881 Bacteremia: Secondary | ICD-10-CM

## 2012-07-17 LAB — CBC WITH DIFFERENTIAL/PLATELET
Hemoglobin: 11.4 g/dL — ABNORMAL LOW (ref 13.0–17.0)
Lymphocytes Relative: 13 % (ref 12–46)
Lymphs Abs: 1.1 10*3/uL (ref 0.7–4.0)
Monocytes Relative: 9 % (ref 3–12)
Neutro Abs: 6.6 10*3/uL (ref 1.7–7.7)
Neutrophils Relative %: 77 % (ref 43–77)
Platelets: 144 10*3/uL — ABNORMAL LOW (ref 150–400)
RBC: 3.51 MIL/uL — ABNORMAL LOW (ref 4.22–5.81)
WBC: 8.5 10*3/uL (ref 4.0–10.5)

## 2012-07-17 LAB — LEGIONELLA ANTIGEN, URINE: Legionella Antigen, Urine: NEGATIVE

## 2012-07-17 LAB — COMPREHENSIVE METABOLIC PANEL
AST: 24 U/L (ref 0–37)
BUN: 26 mg/dL — ABNORMAL HIGH (ref 6–23)
CO2: 27 mEq/L (ref 19–32)
Calcium: 8.8 mg/dL (ref 8.4–10.5)
Chloride: 99 mEq/L (ref 96–112)
Creatinine, Ser: 0.87 mg/dL (ref 0.50–1.35)
GFR calc non Af Amer: >90 mL/min (ref >90)
Total Bilirubin: 0.7 mg/dL (ref 0.3–1.2)

## 2012-07-17 LAB — EXPECTORATED SPUTUM ASSESSMENT W GRAM STAIN, RFLX TO RESP C

## 2012-07-17 MED ORDER — SENNOSIDES-DOCUSATE SODIUM 8.6-50 MG PO TABS
2.0000 | ORAL_TABLET | Freq: Once | ORAL | Status: AC
  Administered 2012-07-17: 2 via ORAL
  Filled 2012-07-17: qty 2

## 2012-07-17 MED ORDER — VANCOMYCIN HCL 1000 MG IV SOLR
750.0000 mg | Freq: Two times a day (BID) | INTRAVENOUS | Status: DC
  Administered 2012-07-17 – 2012-07-19 (×6): 750 mg via INTRAVENOUS
  Filled 2012-07-17 (×6): qty 750

## 2012-07-17 NOTE — Progress Notes (Signed)
RN was notified by the lab. to say that all 4 bottles both anaerobic and aerobic blood cultures were positive for gram positive cocci in pairs. RN will notify the MD.

## 2012-07-17 NOTE — Progress Notes (Signed)
ANTIBIOTIC CONSULT NOTE - INITIAL  Pharmacy Consult for vancomycin Indication: pneumonia  Allergies  Allergen Reactions  . Codeine Nausea Only    Patient Measurements: Height: 5\' 10"  (177.8 cm) Weight: 130 lb 1.1 oz (59 kg) IBW/kg (Calculated) : 73  Adjusted Body Weight:   Vital Signs: Temp: 98.3 F (36.8 C) (12/31 0139) Temp src: Oral (12/31 0139) BP: 126/79 mmHg (12/31 0139) Pulse Rate: 73  (12/31 0139) Intake/Output from previous day: 12/30 0701 - 12/31 0700 In: 1575 [P.O.:360; I.V.:1215] Out: -  Intake/Output from this shift: Total I/O In: 535 [P.O.:120; I.V.:415] Out: -   Labs:  Basename 07/16/12 0630 07/16/12 0123  WBC -- 15.7*  HGB -- 14.3  PLT -- 181  LABCREA 116.7 --  CREATININE -- 0.93   Estimated Creatinine Clearance: 79.3 ml/min (by C-G formula based on Cr of 0.93). No results found for this basename: VANCOTROUGH:2,VANCOPEAK:2,VANCORANDOM:2,GENTTROUGH:2,GENTPEAK:2,GENTRANDOM:2,TOBRATROUGH:2,TOBRAPEAK:2,TOBRARND:2,AMIKACINPEAK:2,AMIKACINTROU:2,AMIKACIN:2, in the last 72 hours   Microbiology: Recent Results (from the past 720 hour(s))  CULTURE, BLOOD (ROUTINE X 2)     Status: Normal (Preliminary result)   Collection Time   07/16/12  3:36 AM      Component Value Range Status Comment   Specimen Description BLOOD RIGHT ANTECUBITAL   Final    Special Requests BOTTLES DRAWN AEROBIC AND ANAEROBIC [facility]   Final    Culture  Setup Time 07/16/2012 08:52   Final    Culture     Final    Value: GRAM POSITIVE COCCI IN PAIRS     Note: Gram Stain Report Called to,Read Back By and Verified With: JOANNE SCOTTON ON 07/17/2012 AT 12:23A BY WILEJ   Report Status PENDING   Incomplete   CULTURE, BLOOD (ROUTINE X 2)     Status: Normal (Preliminary result)   Collection Time   07/16/12  3:42 AM      Component Value Range Status Comment   Specimen Description BLOOD LEFT ANTECUBITAL   Final    Special Requests BOTTLES DRAWN AEROBIC AND ANAEROBIC [facility]   Final    Culture  Setup  Time 07/16/2012 08:52   Final    Culture     Final    Value: GRAM POSITIVE COCCI IN PAIRS     Note: Gram Stain Report Called to,Read Back By and Verified With: JOANNE SCOTTON ON 07/17/2012 AT 12:22A BY WILEJ   Report Status PENDING   Incomplete     Medical History: History reviewed. No pertinent past medical history.  Medications:  Anti-infectives     Start     Dose/Rate Route Frequency Ordered Stop   07/17/12 0600   cefTRIAXone (ROCEPHIN) 1 g in dextrose 5 % 50 mL IVPB        1 g 100 mL/hr over 30 Minutes Intravenous Every 24 hours 07/16/12 0520 07/24/12 0559   07/17/12 0600   azithromycin (ZITHROMAX) tablet 500 mg        500 mg Oral Every 24 hours 07/16/12 0520 07/24/12 0559   07/17/12 0200   vancomycin (VANCOCIN) 750 mg in sodium chloride 0.9 % 150 mL IVPB        750 mg 150 mL/hr over 60 Minutes Intravenous 2 times daily 07/17/12 0154     07/16/12 0315   cefTRIAXone (ROCEPHIN) 1 g in dextrose 5 % 50 mL IVPB        1 g 100 mL/hr over 30 Minutes Intravenous  Once 07/16/12 0301 07/16/12 0502   07/16/12 0315   azithromycin (ZITHROMAX) 500 mg in dextrose 5 % 250 mL  IVPB        500 mg 250 mL/hr over 60 Minutes Intravenous  Once 07/16/12 0301 07/16/12 0601         Assessment: Patient with PNA.    Goal of Therapy:  Vancomycin trough level 15-20 mcg/ml  Plan:  Measure antibiotic drug levels at steady state Follow up culture results Vancomycin 750mg  iv q12hr  Darlina Guys, Jacquenette Shone Crowford 07/17/2012,1:56 AM

## 2012-07-17 NOTE — Progress Notes (Signed)
[facility] PROGRESS NOTE  Christopher Logan:[id number] DOB: [birth year] DOA: 07/16/2012 PCP: No primary provider on file.  Assessment/Plan: 1. Streptococcus Pneumonia  With bacteremia - complicating features on admission are chest pain, alcoholism, tobacco abuse,  hyponatremia , tachycardia, tachypnea. Patient started on iv rocephin and zithromax on admission 12/30 - and vancomycin added 12/31 . Urine strep antigen turned positive. Blood cultures positive 2/2  2. Tobacco abuse - counseled 3. Alcohol abuse counseled . Started folic acid and thiamine on admission.  4. Pleuritic chest pain - suspect patient will develop para pneumonic effussion - f/u CXR closely. toradol iv for pain 5. HTN - from pain   6. Hyponatremia - probably siadh due to pain  7. Cocaine abuse -  8. Thrombocytopenia - mild   Code Status: full  Family Communication: patient Disposition Plan: home   Consultants:  none  Procedures:  none  Antibiotics: Rocephin and Zithromax 12/30 Vanc 12/31   HPI/Subjective: Decreased chest pain, overall feels better   Objective: Filed Vitals:   07/16/12 1437 07/16/12 2033 07/17/12 0139 07/17/12 0451  BP: 122/81 139/85 126/79 132/83  Pulse: 86 92 73 73  Temp: 98.3 F (36.8 C) 98.9 F (37.2 C) 98.3 F (36.8 C) 98.1 F (36.7 C)  TempSrc: Oral Oral Oral Oral  Resp: 20 20 19 18   Height:      Weight:      SpO2: 98% 97% 98% 99%    Intake/Output Summary (Last 24 hours) at 07/17/12 0840 Last data filed at 07/17/12 0600  Gross per 24 hour  Intake   3260 ml  Output    625 ml  Net   2635 ml   Filed Weights   07/16/12 0528  Weight: 59 kg (130 lb 1.1 oz)    Exam:   General:  axox3  Cardiovascular: tachy, reg, no murmurs   Respiratory: decreased sounds left base, no wheezes, barrel shaped chest   Abdomen: soft, nt   Data Reviewed: Basic Metabolic Panel:  Lab 07/17/12 8119 07/16/12 0123  NA 130* 131*  K 3.5 3.8  CL 99 95*  CO2 27 25  GLUCOSE  97 126*  BUN 26* 30*  CREATININE 0.87 0.93  CALCIUM 8.8 9.0  MG -- --  PHOS -- --   Liver Function Tests:  Lab 07/17/12 0445  AST 24  ALT 25  ALKPHOS 27*  BILITOT 0.7  PROT 6.2  ALBUMIN 2.4*   No results found for this basename: LIPASE:5,AMYLASE:5 in the last 168 hours No results found for this basename: AMMONIA:5 in the last 168 hours CBC:  Lab 07/17/12 0445 07/16/12 0123  WBC 8.5 15.7*  NEUTROABS 6.6 --  HGB 11.4* 14.3  HCT 33.7* 40.6  MCV 96.0 92.3  PLT 144* 181   Cardiac Enzymes:  Lab 07/16/12 0745 07/16/12 0123  CKTOTAL -- --  CKMB -- --  CKMBINDEX -- --  TROPONINI <0.30 <0.30   BNP (last 3 results) No results found for this basename: PROBNP:3 in the last 8760 hours CBG: No results found for this basename: GLUCAP:5 in the last 168 hours  Recent Results (from the past 240 hour(s))  CULTURE, BLOOD (ROUTINE X 2)     Status: Normal (Preliminary result)   Collection Time   07/16/12  3:36 AM      Component Value Range Status Comment   Specimen Description BLOOD RIGHT ANTECUBITAL   Final    Special Requests BOTTLES DRAWN AEROBIC AND ANAEROBIC [facility]   Final    Culture  Setup Time 07/16/2012 08:52   Final    Culture     Final    Value: GRAM POSITIVE COCCI IN PAIRS     Note: Gram Stain Report Called to,Read Back By and Verified With: JOANNE SCOTTON ON 07/17/2012 AT 12:23A BY WILEJ   Report Status PENDING   Incomplete   CULTURE, BLOOD (ROUTINE X 2)     Status: Normal (Preliminary result)   Collection Time   07/16/12  3:42 AM      Component Value Range Status Comment   Specimen Description BLOOD LEFT ANTECUBITAL   Final    Special Requests BOTTLES DRAWN AEROBIC AND ANAEROBIC [facility]   Final    Culture  Setup Time 07/16/2012 08:52   Final    Culture     Final    Value: GRAM POSITIVE COCCI IN PAIRS     Note: Gram Stain Report Called to,Read Back By and Verified With: JOANNE SCOTTON ON 07/17/2012 AT 12:22A BY WILEJ   Report Status PENDING   Incomplete   CULTURE,  EXPECTORATED SPUTUM-ASSESSMENT     Status: Normal   Collection Time   07/17/12  5:53 AM      Component Value Range Status Comment   Specimen Description SPUTUM   Final    Special Requests Normal   Final    Sputum evaluation     Final    Value: THIS SPECIMEN IS ACCEPTABLE. RESPIRATORY CULTURE REPORT TO FOLLOW.   Report Status 07/17/2012 FINAL   Final      Studies: Ct Angio Chest W/cm &/or Wo Cm  07/16/2012  *RADIOLOGY REPORT*  Clinical Data: Sudden onset of left-sided chest pain.  Cough. Leukocytosis.  History of smoking.  CT ANGIOGRAPHY CHEST  Technique:  Multidetector CT imaging of the chest using the standard protocol during bolus administration of intravenous contrast. Multiplanar reconstructed images including MIPs were obtained and reviewed to evaluate the vascular anatomy.  Contrast: OMNIPAQUE IOHEXOL 350 MG/ML SOLN  Comparison: Chest radiograph performed earlier today at 01:28 a.m.  Findings: There is no evidence of pulmonary embolus.  There is focal airspace opacification involving the posterior aspect of the left upper lobe, with minimal patchy opacity at the left lower lobe, compatible with multifocal pneumonia.  Minimal right basilar atelectasis is seen.  There is no evidence of pleural effusion or pneumothorax.  No masses are identified; no abnormal focal contrast enhancement is seen.  Scattered coronary artery calcification is noted.  Vague soft tissue density is noted in the subcarinal region, measuring 1.2 cm in short axis.  A 1.1 cm aortopulmonary window node is seen. Additional scattered mediastinal nodes remain borderline normal in size. No pericardial effusion is identified.  The great vessels demonstrate mild scattered calcification, but are otherwise grossly unremarkable.  No axillary lymphadenopathy is seen.  The visualized portions of the thyroid gland are unremarkable in appearance.  The visualized portions of the liver and spleen are unremarkable.  No acute osseous  abnormalities are seen.  IMPRESSION:  1.  No evidence of pulmonary embolus. 2.  Focal left upper lobe and minimal left lower lobe pneumonia. 3.  Minimal right basilar atelectasis seen. 4.  Scattered coronary artery calcification noted. 5.  Vague soft tissue density at the subcarinal region, measuring 1.2 cm in short axis, and 1.1 cm aortopulmonary window node.  This may reflect the acute infectious process, but remains nonspecific.   Original Report Authenticated By: Tonia Ghent, M.D.    Dg Chest Portable 1 View  07/16/2012  *RADIOLOGY REPORT*  Clinical Data: Shortness of breath.  Chest pain.  PORTABLE CHEST - 1 VIEW  Comparison: None.  Findings: Asymmetric airspace opacity is seen in the peripheral left midlung.  Differential diagnosis includes pneumonia, hemorrhage, and infarct.  Right lung is clear.  No evidence of pleural effusion.  Heart size is normal.  IMPRESSION:  Left mid lung airspace opacity.  Differential diagnosis includes pneumonia, hemorrhage, and infarct.   Original Report Authenticated By: Myles Rosenthal, M.D.     Scheduled Meds:    . azithromycin  500 mg Oral Q24H  . cefTRIAXone (ROCEPHIN)  IV  1 g Intravenous Q24H  . enoxaparin (LOVENOX) injection  40 mg Subcutaneous Q24H  . folic acid  1 mg Oral Daily  . ketorolac  30 mg Intravenous Q8H  . nicotine  14 mg Transdermal Daily  . thiamine  100 mg Oral Daily  . vancomycin  750 mg Intravenous BID   Continuous Infusions:    . sodium chloride 100 mL/hr at 07/17/12 1610    Active Problems:  CAP (community acquired pneumonia)  Chest pain  Hyponatremia  Bacteremia      Terell Kincy  [facility] Pager [phone]. If 8PM-8AM, please contact night-coverage at [email or web address], password [facility] 07/17/2012, 8:40 AM  LOS: 1 day

## 2012-07-18 DIAGNOSIS — R7881 Bacteremia: Secondary | ICD-10-CM | POA: Diagnosis present

## 2012-07-18 MED ORDER — SORBITOL 70 % SOLN
30.0000 mL | Freq: Once | Status: AC
  Administered 2012-07-18: 30 mL via ORAL
  Filled 2012-07-18: qty 30

## 2012-07-18 MED ORDER — BUTALBITAL-APAP-CAFFEINE 50-325-40 MG PO TABS
1.0000 | ORAL_TABLET | ORAL | Status: DC | PRN
  Administered 2012-07-18 – 2012-07-22 (×12): 1 via ORAL
  Filled 2012-07-18 (×12): qty 1

## 2012-07-18 NOTE — Progress Notes (Signed)
[facility] PROGRESS NOTE  Christopher Logan JXB:[id number] DOB: 1960-08-14 DOA: 07/16/2012 PCP: No primary provider on file.  Assessment/Plan: 1. Streptococcus Pneumonia  with bacteremia - complicating features on admission are chest pain, alcoholism, tobacco abuse,  hyponatremia , tachycardia, tachypnea. Patient started on iv rocephin and zithromax on admission 12/30 - and vancomycin added 12/31 . Urine strep antigen turned positive. Blood cultures positive 2/2 . zithromax DCed 07/18/12  2. Tobacco abuse - counseled 3. Alcohol abuse counseled . Started folic acid and thiamine on admission.  4. Pleuritic chest pain - suspect patient will develop para pneumonic effussion - f/u CXR closely. toradol iv for pain 5. HTN - from pain   6. Hyponatremia - probably siadh due to pain  7. Cocaine abuse -  8. Thrombocytopenia - mild  9. Migraine headaches-patient started on Fioricet on January 1 10. Constipation-probably due to IV narcotics-patient started on laxatives and Dilaudid discontinued on 07/18/12  Code Status: full  Family Communication: patient Disposition Plan: home   Consultants:  none  Procedures:  none  Antibiotics: Rocephin 12/30  Zithromax 12/30-1/1 Vanc 12/31   HPI/Subjective: Decreased chest pain, overall feels better   Objective: Filed Vitals:   07/17/12 2229 07/17/12 2314 07/18/12 0129 07/18/12 0422  BP: 131/87  115/92 122/77  Pulse: 96  87 75  Temp: 100.3 F (37.9 C) 99.9 F (37.7 C) 98 F (36.7 C) 98.3 F (36.8 C)  TempSrc: Oral Oral Oral Oral  Resp: 19  18 18   Height:      Weight:      SpO2: 100%  98% 97%    Intake/Output Summary (Last 24 hours) at 07/18/12 0853 Last data filed at 07/18/12 0824  Gross per 24 hour  Intake   3705 ml  Output      0 ml  Net   3705 ml   Filed Weights   07/16/12 0528  Weight: 59 kg (130 lb 1.1 oz)    Exam:   General:  axox3  Cardiovascular: tachy, reg, no murmurs   Respiratory: decreased sounds left  base, no wheezes, crackles left base  barrel shaped chest   Abdomen: soft, nt   Data Reviewed: Basic Metabolic Panel:  Lab 07/17/12 1308 07/16/12 0123  NA 130* 131*  K 3.5 3.8  CL 99 95*  CO2 27 25  GLUCOSE 97 126*  BUN 26* 30*  CREATININE 0.87 0.93  CALCIUM 8.8 9.0  MG -- --  PHOS -- --   Liver Function Tests:  Lab 07/17/12 0445  AST 24  ALT 25  ALKPHOS 27*  BILITOT 0.7  PROT 6.2  ALBUMIN 2.4*   No results found for this basename: LIPASE:5,AMYLASE:5 in the last 168 hours No results found for this basename: AMMONIA:5 in the last 168 hours CBC:  Lab 07/17/12 0445 07/16/12 0123  WBC 8.5 15.7*  NEUTROABS 6.6 --  HGB 11.4* 14.3  HCT 33.7* 40.6  MCV 96.0 92.3  PLT 144* 181   Cardiac Enzymes:  Lab 07/16/12 0745 07/16/12 0123  CKTOTAL -- --  CKMB -- --  CKMBINDEX -- --  TROPONINI <0.30 <0.30   BNP (last 3 results) No results found for this basename: PROBNP:3 in the last 8760 hours CBG: No results found for this basename: GLUCAP:5 in the last 168 hours  Recent Results (from the past 240 hour(s))  CULTURE, BLOOD (ROUTINE X 2)     Status: Normal (Preliminary result)   Collection Time   07/16/12  3:36 AM  Component Value Range Status Comment   Specimen Description BLOOD RIGHT ANTECUBITAL   Final    Special Requests BOTTLES DRAWN AEROBIC AND ANAEROBIC [facility]   Final    Culture  Setup Time 07/16/2012 08:52   Final    Culture     Final    Value: GRAM POSITIVE COCCI IN PAIRS     Note: Gram Stain Report Called to,Read Back By and Verified With: JOANNE SCOTTON ON 07/17/2012 AT 12:23A BY WILEJ   Report Status PENDING   Incomplete   CULTURE, BLOOD (ROUTINE X 2)     Status: Normal (Preliminary result)   Collection Time   07/16/12  3:42 AM      Component Value Range Status Comment   Specimen Description BLOOD LEFT ANTECUBITAL   Final    Special Requests BOTTLES DRAWN AEROBIC AND ANAEROBIC [facility]   Final    Culture  Setup Time 07/16/2012 08:52   Final    Culture      Final    Value: GRAM POSITIVE COCCI IN PAIRS     Note: Gram Stain Report Called to,Read Back By and Verified With: JOANNE SCOTTON ON 07/17/2012 AT 12:22A BY WILEJ   Report Status PENDING   Incomplete   CULTURE, EXPECTORATED SPUTUM-ASSESSMENT     Status: Normal   Collection Time   07/17/12  5:53 AM      Component Value Range Status Comment   Specimen Description SPUTUM   Final    Special Requests Normal   Final    Sputum evaluation     Final    Value: THIS SPECIMEN IS ACCEPTABLE. RESPIRATORY CULTURE REPORT TO FOLLOW.   Report Status 07/17/2012 FINAL   Final   CULTURE, RESPIRATORY     Status: Normal (Preliminary result)   Collection Time   07/17/12  5:53 AM      Component Value Range Status Comment   Specimen Description SPUTUM   Final    Special Requests NONE   Final    Gram Stain     Final    Value: RARE WBC PRESENT, PREDOMINANTLY MONONUCLEAR     FEW SQUAMOUS EPITHELIAL CELLS PRESENT     FEW GRAM NEGATIVE RODS     FEW GRAM POSITIVE COCCI IN PAIRS     RARE GRAM POSITIVE RODS   Culture PENDING   Incomplete    Report Status PENDING   Incomplete      Studies: Dg Chest 2 View  07/17/2012  *RADIOLOGY REPORT*  Clinical Data: Left chest pain, evaluate pleural effusion  CHEST - 2 VIEW  Comparison: CT chest dated 07/16/2012  Findings: Lingular opacity, suspicious for pneumonia when correlating with CT.  Mild left basilar opacity, atelectasis versus pneumonia.  Possible trace left pleural effusion.  No pneumothorax.  The heart is normal in size.  Visualized osseous structures are within normal limits.  IMPRESSION: Lingular pneumonia.  Mild left basilar opacity, atelectasis versus pneumonia.  Possible trace left pleural effusion.   Original Report Authenticated By: Charline Bills, M.D.     Scheduled Meds:    . azithromycin  500 mg Oral Q24H  . cefTRIAXone (ROCEPHIN)  IV  1 g Intravenous Q24H  . enoxaparin (LOVENOX) injection  40 mg Subcutaneous Q24H  . folic acid  1 mg Oral Daily  .  ketorolac  30 mg Intravenous Q8H  . nicotine  14 mg Transdermal Daily  . thiamine  100 mg Oral Daily  . vancomycin  750 mg Intravenous BID   Continuous Infusions:    .  sodium chloride 100 mL/hr at 07/18/12 0550    Principal Problem:  *CAP (community acquired pneumonia) Active Problems:  Chest pain  Hyponatremia  Bacteremia      Christopher Logan  [facility] Pager [phone]. If 8PM-8AM, please contact night-coverage at [email or web address], password [facility] 07/18/2012, 8:53 AM  LOS: 2 days

## 2012-07-19 ENCOUNTER — Inpatient Hospital Stay (HOSPITAL_COMMUNITY): Payer: Self-pay

## 2012-07-19 DIAGNOSIS — J154 Pneumonia due to other streptococci: Secondary | ICD-10-CM

## 2012-07-19 DIAGNOSIS — R7881 Bacteremia: Secondary | ICD-10-CM

## 2012-07-19 DIAGNOSIS — B953 Streptococcus pneumoniae as the cause of diseases classified elsewhere: Secondary | ICD-10-CM

## 2012-07-19 LAB — CBC
HCT: 35.9 % — ABNORMAL LOW (ref 39.0–52.0)
MCHC: 34.8 g/dL (ref 30.0–36.0)
Platelets: 217 10*3/uL (ref 150–400)
RDW: 13.1 % (ref 11.5–15.5)
WBC: 6.4 10*3/uL (ref 4.0–10.5)

## 2012-07-19 LAB — BASIC METABOLIC PANEL
BUN: 13 mg/dL (ref 6–23)
Calcium: 8.7 mg/dL (ref 8.4–10.5)
Chloride: 102 mEq/L (ref 96–112)
Creatinine, Ser: 0.77 mg/dL (ref 0.50–1.35)
GFR calc Af Amer: >90 mL/min (ref >90)

## 2012-07-19 MED ORDER — DEXTROSE 5 % IV SOLN
2.0000 g | Freq: Two times a day (BID) | INTRAVENOUS | Status: DC
  Administered 2012-07-19 – 2012-07-22 (×6): 2 g via INTRAVENOUS
  Filled 2012-07-19 (×7): qty 2

## 2012-07-19 MED ORDER — IBUPROFEN 800 MG PO TABS
400.0000 mg | ORAL_TABLET | Freq: Four times a day (QID) | ORAL | Status: DC | PRN
  Administered 2012-07-21 – 2012-07-22 (×2): 400 mg via ORAL
  Filled 2012-07-19 (×2): qty 1

## 2012-07-19 NOTE — Progress Notes (Signed)
Pt temperature 102.1.  Notified MD.

## 2012-07-19 NOTE — Progress Notes (Signed)
*  PRELIMINARY RESULTS* Echocardiogram 2D Echocardiogram has been performed.  Christopher Logan 07/19/2012, 1:48 PM

## 2012-07-19 NOTE — Consult Note (Signed)
[facility]     Reason for Consult: persistent fever in someone with Strep pneumo pneumonia    Referring Physician: Dr. Lavera Guise  Principal Problem:  *CAP (community acquired pneumonia) Active Problems:  Chest pain  Hyponatremia  Bacteremia due to Streptococcus pneumoniae      . cefTRIAXone (ROCEPHIN)  IV  2 g Intravenous Q12H  . enoxaparin (LOVENOX) injection  40 mg Subcutaneous Q24H  . folic acid  1 mg Oral Daily  . nicotine  14 mg Transdermal Daily  . thiamine  100 mg Oral Daily    Recommendations: Continue ceftriaxone 2 grams q 24 hours Repeat blood cultures as you are doing  Would treat IV until fever resolves Will await echo, no TEE indicated for this organism unless persistently positive blood cultures.    Assessment: CAP with strep pneumonia.  Typically can be persistent symptoms.   Antibiotics: Ceftriaxone 2 grams day 1 4 days of ceftriaxone 1 gram Azithromycin 4 days   HPI: Christopher Logan is a 52 y.o. male with a history of alcohol abuse, cocaine abuse who presented on 12/30 with pleuritic chest pain, non productive cough and found to have an elevated WBC and infiltrate c/w CAP.  He was started on appropriate therapy and subsequent blood cultures have grown Streptococcus pneumonia and sputum culture as well.  His WBC has improved but he has had persistent fever, usually in the afternoon.  He continues to have a non productive cough.  At this time, he denies any chest pain and no SOB.  He has clinically improved and has been better able to use the insentive spirometer.     Review of Systems: Pertinent items are noted in HPI.  History reviewed. No pertinent past medical history.  History  Substance Use Topics  . Smoking status: Current Every Day Smoker -- 1.0 packs/day    Types: Cigarettes  . Smokeless tobacco: Not on file  . Alcohol Use: Yes     Comment: 40 oz of beer a day.     Family History  Problem Relation Age of Onset   . Cancer Mother    Allergies  Allergen Reactions  . Codeine Nausea Only    OBJECTIVE: Blood pressure 154/93, pulse 94, temperature 102.1 F (38.9 C), temperature source Oral, resp. rate 18, height 5\' 10"  (1.778 m), weight 130 lb 1.1 oz (59 kg), SpO2 98.00%. General: Awake, alert, oriented x 3, nad Skin: no rash Lungs: right sided basilar crackles Cor: RRR without m/r/g Abdomen: soft, ntnd, +bs  Microbiology: Recent Results (from the past 240 hour(s))  CULTURE, BLOOD (ROUTINE X 2)     Status: Normal (Preliminary result)   Collection Time   07/16/12  3:36 AM      Component Value Range Status Comment   Specimen Description BLOOD RIGHT ANTECUBITAL   Final    Special Requests BOTTLES DRAWN AEROBIC AND ANAEROBIC [facility]   Final    Culture  Setup Time 07/16/2012 08:52   Final    Culture     Final    Value: STREPTOCOCCUS PNEUMONIAE     Note: Gram Stain Report Called to,Read Back By and Verified With: JOANNE SCOTTON ON 07/17/2012 AT 12:23A BY WILEJ   Report Status PENDING   Incomplete   CULTURE, BLOOD (ROUTINE X 2)     Status: Normal (Preliminary result)   Collection Time   07/16/12  3:42 AM      Component Value Range Status Comment   Specimen Description BLOOD LEFT  ANTECUBITAL   Final    Special Requests BOTTLES DRAWN AEROBIC AND ANAEROBIC [facility]   Final    Culture  Setup Time 07/16/2012 08:52   Final    Culture     Final    Value: STREPTOCOCCUS PNEUMONIAE     Note: Gram Stain Report Called to,Read Back By and Verified With: JOANNE SCOTTON ON 07/17/2012 AT 12:22A BY WILEJ   Report Status PENDING   Incomplete   CULTURE, EXPECTORATED SPUTUM-ASSESSMENT     Status: Normal   Collection Time   07/17/12  5:53 AM      Component Value Range Status Comment   Specimen Description SPUTUM   Final    Special Requests Normal   Final    Sputum evaluation     Final    Value: THIS SPECIMEN IS ACCEPTABLE. RESPIRATORY CULTURE REPORT TO FOLLOW.   Report Status 07/17/2012 FINAL   Final   CULTURE,  RESPIRATORY     Status: Normal (Preliminary result)   Collection Time   07/17/12  5:53 AM      Component Value Range Status Comment   Specimen Description SPUTUM   Final    Special Requests NONE   Final    Gram Stain     Final    Value: RARE WBC PRESENT, PREDOMINANTLY MONONUCLEAR     FEW SQUAMOUS EPITHELIAL CELLS PRESENT     FEW GRAM NEGATIVE RODS     FEW GRAM POSITIVE COCCI IN PAIRS     RARE GRAM POSITIVE RODS   Culture ABUNDANT STREPTOCOCCUS PNEUMONIAE   Final    Report Status PENDING   Incomplete     Staci Righter, MD [facility] [phone] pager  [phone] cell 07/19/2012, 3:44 PM

## 2012-07-19 NOTE — Progress Notes (Signed)
[facility] PROGRESS NOTE  Christopher Logan ZOX:[id number] DOB: 05/26/1961 DOA: 07/16/2012 PCP: No primary provider on file.  Assessment/Plan: 1. Streptococcus Pneumonia  with bacteremia - complicating features on admission are chest pain, alcoholism, tobacco abuse,  hyponatremia , tachycardia, tachypnea. HIV NR. Patient started on iv rocephin and zithromax on admission 12/30 - and vancomycin added 12/31 . Urine strep antigen turned positive. Blood cultures positive 2/2 . zithromax DCed 07/18/12 . Vancomycin DCed 07/19/12 and patient changed to high dose iv Rocephin 2g iv q 12 hrs on 07/19/12. As he was still having fevers. Surveillance Blood cultures were sent on 07/19/12. Echo ordered 07/19/12  2. Tobacco abuse - counseled 3. Alcohol abuse counseled . Started folic acid and thiamine on admission.  4. Pleuritic chest pain - suspect patient will develop para pneumonic effussion - f/u CXR closely. toradol iv for pain given for 3 days.  5. HTN - from pain   6. Hyponatremia - probably siadh due to pain - resolved.  7. Cocaine abuse -  8. Thrombocytopenia - mild  9. Migraine headaches-patient started on Fioricet on January 1 10. Constipation-probably due to IV narcotics-patient started on laxatives and Dilaudid discontinued on 07/18/12 - and constipation resolved   Code Status: full  Family Communication: patient Disposition Plan: home   Consultants:  none  Procedures:  none  Antibiotics: Rocephin 12/30  Zithromax 12/30-1/1 Vanc 12/31 -1/2  HPI/Subjective: Decreased chest pain, overall feels better   Objective: Filed Vitals:   07/18/12 1443 07/18/12 2112 07/19/12 0149 07/19/12 0447  BP: 134/87 155/88  146/85  Pulse: 78 89  89  Temp: 98 F (36.7 C) 101.2 F (38.4 C) 98.7 F (37.1 C) 99.2 F (37.3 C)  TempSrc: Oral Oral Oral Oral  Resp: 18 18  20   Height:      Weight:      SpO2: 99% 100%  98%    Intake/Output Summary (Last 24 hours) at 07/19/12 0831 Last data filed at  07/19/12 0700  Gross per 24 hour  Intake   1205 ml  Output      0 ml  Net   1205 ml   Filed Weights   07/16/12 0528  Weight: 59 kg (130 lb 1.1 oz)    Exam:   General:  axox3  Cardiovascular: tachy, reg, no murmurs   Respiratory: decreased sounds left base, no wheezes, crackles left base  barrel shaped chest   Abdomen: soft, nt   Data Reviewed: Basic Metabolic Panel:  Lab 07/19/12 8119 07/17/12 0445 07/16/12 0123  NA 136 130* 131*  K 3.5 3.5 3.8  CL 102 99 95*  CO2 27 27 25   GLUCOSE 98 97 126*  BUN 13 26* 30*  CREATININE 0.77 0.87 0.93  CALCIUM 8.7 8.8 9.0  MG -- -- --  PHOS -- -- --   Liver Function Tests:  Lab 07/17/12 0445  AST 24  ALT 25  ALKPHOS 27*  BILITOT 0.7  PROT 6.2  ALBUMIN 2.4*   No results found for this basename: LIPASE:5,AMYLASE:5 in the last 168 hours No results found for this basename: AMMONIA:5 in the last 168 hours CBC:  Lab 07/19/12 0449 07/17/12 0445 07/16/12 0123  WBC 6.4 8.5 15.7*  NEUTROABS -- 6.6 --  HGB 12.5* 11.4* 14.3  HCT 35.9* 33.7* 40.6  MCV 92.8 96.0 92.3  PLT 217 144* 181   Cardiac Enzymes:  Lab 07/16/12 0745 07/16/12 0123  CKTOTAL -- --  CKMB -- --  CKMBINDEX -- --  TROPONINI <0.30 <  0.30   BNP (last 3 results) No results found for this basename: PROBNP:3 in the last 8760 hours CBG: No results found for this basename: GLUCAP:5 in the last 168 hours  Recent Results (from the past 240 hour(s))  CULTURE, BLOOD (ROUTINE X 2)     Status: Normal (Preliminary result)   Collection Time   07/16/12  3:36 AM      Component Value Range Status Comment   Specimen Description BLOOD RIGHT ANTECUBITAL   Final    Special Requests BOTTLES DRAWN AEROBIC AND ANAEROBIC [facility]   Final    Culture  Setup Time 07/16/2012 08:52   Final    Culture     Final    Value: STREPTOCOCCUS PNEUMONIAE     Note: Gram Stain Report Called to,Read Back By and Verified With: JOANNE SCOTTON ON 07/17/2012 AT 12:23A BY WILEJ   Report Status PENDING    Incomplete   CULTURE, BLOOD (ROUTINE X 2)     Status: Normal (Preliminary result)   Collection Time   07/16/12  3:42 AM      Component Value Range Status Comment   Specimen Description BLOOD LEFT ANTECUBITAL   Final    Special Requests BOTTLES DRAWN AEROBIC AND ANAEROBIC [facility]   Final    Culture  Setup Time 07/16/2012 08:52   Final    Culture     Final    Value: STREPTOCOCCUS PNEUMONIAE     Note: Gram Stain Report Called to,Read Back By and Verified With: JOANNE SCOTTON ON 07/17/2012 AT 12:22A BY WILEJ   Report Status PENDING   Incomplete   CULTURE, EXPECTORATED SPUTUM-ASSESSMENT     Status: Normal   Collection Time   07/17/12  5:53 AM      Component Value Range Status Comment   Specimen Description SPUTUM   Final    Special Requests Normal   Final    Sputum evaluation     Final    Value: THIS SPECIMEN IS ACCEPTABLE. RESPIRATORY CULTURE REPORT TO FOLLOW.   Report Status 07/17/2012 FINAL   Final   CULTURE, RESPIRATORY     Status: Normal (Preliminary result)   Collection Time   07/17/12  5:53 AM      Component Value Range Status Comment   Specimen Description SPUTUM   Final    Special Requests NONE   Final    Gram Stain     Final    Value: RARE WBC PRESENT, PREDOMINANTLY MONONUCLEAR     FEW SQUAMOUS EPITHELIAL CELLS PRESENT     FEW GRAM NEGATIVE RODS     FEW GRAM POSITIVE COCCI IN PAIRS     RARE GRAM POSITIVE RODS   Culture Culture reincubated for better growth   Final    Report Status PENDING   Incomplete      Studies: Dg Chest 2 View  07/17/2012  *RADIOLOGY REPORT*  Clinical Data: Left chest pain, evaluate pleural effusion  CHEST - 2 VIEW  Comparison: CT chest dated 07/16/2012  Findings: Lingular opacity, suspicious for pneumonia when correlating with CT.  Mild left basilar opacity, atelectasis versus pneumonia.  Possible trace left pleural effusion.  No pneumothorax.  The heart is normal in size.  Visualized osseous structures are within normal limits.  IMPRESSION: Lingular  pneumonia.  Mild left basilar opacity, atelectasis versus pneumonia.  Possible trace left pleural effusion.   Original Report Authenticated By: Charline Bills, M.D.     Scheduled Meds:    . cefTRIAXone (ROCEPHIN)  IV  1 g  Intravenous Q24H  . enoxaparin (LOVENOX) injection  40 mg Subcutaneous Q24H  . folic acid  1 mg Oral Daily  . ketorolac  30 mg Intravenous Q8H  . nicotine  14 mg Transdermal Daily  . thiamine  100 mg Oral Daily  . vancomycin  750 mg Intravenous BID   Continuous Infusions:    . sodium chloride 20 mL/hr (07/18/12 1015)    Principal Problem:  *CAP (community acquired pneumonia) Active Problems:  Chest pain  Hyponatremia  Bacteremia due to Streptococcus pneumoniae      Navy Belay  [facility] Pager [phone]. If 8PM-8AM, please contact night-coverage at [email or web address], password [email or web address] 07/19/2012, 8:31 AM  LOS: 3 days

## 2012-07-20 DIAGNOSIS — F1020 Alcohol dependence, uncomplicated: Secondary | ICD-10-CM | POA: Diagnosis present

## 2012-07-20 DIAGNOSIS — F1010 Alcohol abuse, uncomplicated: Secondary | ICD-10-CM | POA: Diagnosis present

## 2012-07-20 DIAGNOSIS — IMO0001 Reserved for inherently not codable concepts without codable children: Secondary | ICD-10-CM | POA: Diagnosis present

## 2012-07-20 LAB — CULTURE, BLOOD (ROUTINE X 2)

## 2012-07-20 MED ORDER — CLONAZEPAM 1 MG PO TABS
1.0000 mg | ORAL_TABLET | Freq: Three times a day (TID) | ORAL | Status: DC
  Administered 2012-07-20 – 2012-07-21 (×4): 1 mg via ORAL
  Filled 2012-07-20 (×4): qty 1

## 2012-07-20 NOTE — Progress Notes (Signed)
[facility] PROGRESS NOTE  Christopher Logan OZH:[id number] DOB: [birth year] DOA: 07/16/2012 PCP: No primary provider on file.  Assessment/Plan: 1. Streptococcus Pneumonia  with bacteremia - complicating features on admission are chest pain, alcoholism, tobacco abuse,  hyponatremia , tachycardia, tachypnea. HIV NR. Patient started on iv rocephin and zithromax on admission 12/30 - and vancomycin added 12/31 . Urine strep antigen turned positive. Blood cultures positive 2/2 . zithromax DCed 07/18/12 . Vancomycin DCed 07/19/12 and patient changed to high dose iv Rocephin 2g iv q 12 hrs on 07/19/12 because he was having intermittent temp spikes.Surveillance Blood cultures were sent on 07/19/12. Echo ordered 07/19/12 did not show any large vegetations.  ID consultation called 07/19/12 2. Tobacco abuse - counseled 3. Alcohol abuse counseled . Started folic acid and thiamine on admission. Developed withdrawal on 07/20/12 . Patient srated on klonopin TID  4. Pleuritic chest pain - suspect patient will develop para pneumonic effussion - f/u CXR closely. toradol iv for pain given for 3 days.  5. HTN - from pain   6. Hyponatremia - probably siadh due to pain - resolved.  7. Cocaine abuse -  8. Thrombocytopenia - mild  9. Migraine headaches-patient started on Fioricet on January 1 10. Constipation-probably due to IV narcotics-patient started on laxatives and Dilaudid discontinued on 07/18/12 - and constipation resolved   Code Status: full  Family Communication: patient Disposition Plan: home   Consultants:  none  Procedures:  none  Antibiotics: Rocephin 12/30  Zithromax 12/30-1/1 Vanc 12/31 -1/2  HPI/Subjective:   Objective: Filed Vitals:   07/19/12 1425 07/19/12 2050 07/20/12 0234 07/20/12 0613  BP: 154/93 151/93 167/99 137/98  Pulse: 94 86 80 79  Temp: 102.1 F (38.9 C) 101.2 F (38.4 C) 100 F (37.8 C) 98.3 F (36.8 C)  TempSrc: Oral Oral Oral Oral  Resp: 18 18 16 18   Height:        Weight:      SpO2: 98% 95% 97% 96%    Intake/Output Summary (Last 24 hours) at 07/20/12 0838 Last data filed at 07/20/12 0700  Gross per 24 hour  Intake   1330 ml  Output      0 ml  Net   1330 ml   Filed Weights   07/16/12 0528  Weight: 59 kg (130 lb 1.1 oz)    Exam:   General:  axox3  Cardiovascular: tachy, reg, no murmurs   Respiratory: decreased sounds left base, no wheezes, crackles left base  barrel shaped chest   Abdomen: soft, nt   Data Reviewed: Basic Metabolic Panel:  Lab 07/19/12 6295 07/17/12 0445 07/16/12 0123  NA 136 130* 131*  K 3.5 3.5 3.8  CL 102 99 95*  CO2 27 27 25   GLUCOSE 98 97 126*  BUN 13 26* 30*  CREATININE 0.77 0.87 0.93  CALCIUM 8.7 8.8 9.0  MG -- -- --  PHOS -- -- --   Liver Function Tests:  Lab 07/17/12 0445  AST 24  ALT 25  ALKPHOS 27*  BILITOT 0.7  PROT 6.2  ALBUMIN 2.4*   No results found for this basename: LIPASE:5,AMYLASE:5 in the last 168 hours No results found for this basename: AMMONIA:5 in the last 168 hours CBC:  Lab 07/19/12 0449 07/17/12 0445 07/16/12 0123  WBC 6.4 8.5 15.7*  NEUTROABS -- 6.6 --  HGB 12.5* 11.4* 14.3  HCT 35.9* 33.7* 40.6  MCV 92.8 96.0 92.3  PLT 217 144* 181   Cardiac Enzymes:  Lab 07/16/12 0745 07/16/12 0123  CKTOTAL -- --  CKMB -- --  CKMBINDEX -- --  TROPONINI <0.30 <0.30   BNP (last 3 results) No results found for this basename: PROBNP:3 in the last 8760 hours CBG: No results found for this basename: GLUCAP:5 in the last 168 hours  Recent Results (from the past 240 hour(s))  CULTURE, BLOOD (ROUTINE X 2)     Status: Normal (Preliminary result)   Collection Time   07/16/12  3:36 AM      Component Value Range Status Comment   Specimen Description BLOOD RIGHT ANTECUBITAL   Final    Special Requests BOTTLES DRAWN AEROBIC AND ANAEROBIC [facility]   Final    Culture  Setup Time 07/16/2012 08:52   Final    Culture     Final    Value: STREPTOCOCCUS PNEUMONIAE     Note: Gram Stain  Report Called to,Read Back By and Verified With: JOANNE SCOTTON ON 07/17/2012 AT 12:23A BY WILEJ   Report Status PENDING   Incomplete   CULTURE, BLOOD (ROUTINE X 2)     Status: Normal (Preliminary result)   Collection Time   07/16/12  3:42 AM      Component Value Range Status Comment   Specimen Description BLOOD LEFT ANTECUBITAL   Final    Special Requests BOTTLES DRAWN AEROBIC AND ANAEROBIC [facility]   Final    Culture  Setup Time 07/16/2012 08:52   Final    Culture     Final    Value: STREPTOCOCCUS PNEUMONIAE     Note: Gram Stain Report Called to,Read Back By and Verified With: JOANNE SCOTTON ON 07/17/2012 AT 12:22A BY WILEJ   Report Status PENDING   Incomplete   CULTURE, EXPECTORATED SPUTUM-ASSESSMENT     Status: Normal   Collection Time   07/17/12  5:53 AM      Component Value Range Status Comment   Specimen Description SPUTUM   Final    Special Requests Normal   Final    Sputum evaluation     Final    Value: THIS SPECIMEN IS ACCEPTABLE. RESPIRATORY CULTURE REPORT TO FOLLOW.   Report Status 07/17/2012 FINAL   Final   CULTURE, RESPIRATORY     Status: Normal (Preliminary result)   Collection Time   07/17/12  5:53 AM      Component Value Range Status Comment   Specimen Description SPUTUM   Final    Special Requests NONE   Final    Gram Stain     Final    Value: RARE WBC PRESENT, PREDOMINANTLY MONONUCLEAR     FEW SQUAMOUS EPITHELIAL CELLS PRESENT     FEW GRAM NEGATIVE RODS     FEW GRAM POSITIVE COCCI IN PAIRS     RARE GRAM POSITIVE RODS   Culture ABUNDANT STREPTOCOCCUS PNEUMONIAE   Final    Report Status PENDING   Incomplete      Studies: Dg Chest 2 View  07/19/2012  *RADIOLOGY REPORT*  Clinical Data: Pneumonia, cough, fever, pleural effusion  CHEST - 2 VIEW  Comparison: 07/17/2012  Findings: Lingular/left lower lobe opacities, suspicious for pneumonia.  Suspected trace left pleural effusion.  No pneumothorax.  The heart is normal in size.  Very mild degenerative changes of the lower  thoracic spine.  IMPRESSION: Lingular/left lower lobe opacities, suspicious for pneumonia.  Suspected trace left pleural effusion.   Original Report Authenticated By: Charline Bills, M.D.     Scheduled Meds:    . cefTRIAXone (ROCEPHIN)  IV  2 g Intravenous Q12H  .  enoxaparin (LOVENOX) injection  40 mg Subcutaneous Q24H  . folic acid  1 mg Oral Daily  . nicotine  14 mg Transdermal Daily  . thiamine  100 mg Oral Daily   Continuous Infusions:    . sodium chloride 20 mL/hr (07/19/12 2116)    Principal Problem:  *CAP (community acquired pneumonia) Active Problems:  Chest pain  Hyponatremia  Bacteremia due to Streptococcus pneumoniae      Logyn Kendrick  [facility] Pager [phone]. If 8PM-8AM, please contact night-coverage at [email or web address], password [email or web address]  07/20/2012, 8:38 AM  LOS: 4 days

## 2012-07-20 NOTE — Progress Notes (Signed)
Talked to patient about follow up medical care and a list given to patient of outpatient clinic and physicians that are accepting patient without insurance - [facility], [facility], [facility] and [facility]; patient to read over information and decide where he would like to go for medical care for continuation of care. Prescriptions are filled at [facility], his brother provides financial assistance at times. Patient stated " I hate asking for help", encouragement given. Abelino Derrick RN,BSN,MHA

## 2012-07-20 NOTE — Progress Notes (Signed)
   CARE MANAGEMENT NOTE 07/20/2012  Patient:  Christopher Logan, Christopher Logan   Account Number:  [id number]  Date Initiated:  07/20/2012  Documentation initiated by:  Jiles Crocker  Subjective/Objective Assessment:   ADMITTED WITH PNEUMONIA     Action/Plan:   INDEPENDENT PRIOR TO ADMISSION, LIVES WITH FAMILY MEMBERS; SOC WORKER REFERRAL PLACED FOR SUBSTANCE ABUSE   Anticipated DC Date:  07/22/2012   Anticipated DC Plan:  HOME/SELF CARE  In-house referral  Clinical Social Worker      DC Planning Services  CM consult        Status of service:  In process, will continue to follow Medicare Important Message given?  NA - LOS <3 / Initial given by admissions (If response is "NO", the following Medicare IM given date fields will be blank)  Per UR Regulation:  Reviewed for med. necessity/level of care/duration of stay  Comments:  07/20/2012- B Denine Brotz RN,BSN,MHA

## 2012-07-20 NOTE — Progress Notes (Addendum)
[facility]  Date of Admission:  07/16/2012  Antibiotics: Ceftriaxone 2 grams q 24 hours  Subjective: No acute events  Objective: Temp:  [98.3 F (36.8 C)-101.2 F (38.4 C)] 100.2 F (37.9 C) (01/03 1354) Pulse Rate:  [75-86] 75  (01/03 1354) Resp:  [16-18] 18  (01/03 1354) BP: (137-167)/(88-99) 137/88 mmHg (01/03 1354) SpO2:  [95 %-97 %] 97 % (01/03 1354)  General: Seen walking in hall, nad   Lab Results Lab Results  Component Value Date   WBC 6.4 07/19/2012   HGB 12.5* 07/19/2012   HCT 35.9* 07/19/2012   MCV 92.8 07/19/2012   PLT 217 07/19/2012    Lab Results  Component Value Date   CREATININE 0.77 07/19/2012   BUN 13 07/19/2012   NA 136 07/19/2012   K 3.5 07/19/2012   CL 102 07/19/2012   CO2 27 07/19/2012    Lab Results  Component Value Date   ALT 25 07/17/2012   AST 24 07/17/2012   ALKPHOS 27* 07/17/2012   BILITOT 0.7 07/17/2012      Microbiology: Recent Results (from the past 240 hour(s))  CULTURE, BLOOD (ROUTINE X 2)     Status: Normal   Collection Time   07/16/12  3:36 AM      Component Value Range Status Comment   Specimen Description BLOOD RIGHT ANTECUBITAL   Final    Special Requests BOTTLES DRAWN AEROBIC AND ANAEROBIC [facility]   Final    Culture  Setup Time 07/16/2012 08:52   Final    Culture     Final    Value: STREPTOCOCCUS PNEUMONIAE     Note: SUSCEPTIBILITIES PERFORMED ON PREVIOUS CULTURE WITHIN THE LAST 5 DAYS.     Note: Gram Stain Report Called to,Read Back By and Verified With: JOANNE SCOTTON ON 07/17/2012 AT 12:23A BY WILEJ   Report Status 07/20/2012 FINAL   Final   CULTURE, BLOOD (ROUTINE X 2)     Status: Normal (Preliminary result)   Collection Time   07/16/12  3:42 AM      Component Value Range Status Comment   Specimen Description BLOOD LEFT ANTECUBITAL   Final    Special Requests BOTTLES DRAWN AEROBIC AND ANAEROBIC [facility]   Final    Culture  Setup Time 07/16/2012 08:52   Final    Culture     Final    Value: STREPTOCOCCUS  PNEUMONIAE     Note: CEFTRIAXONE TO FOLLOW     Note: Gram Stain Report Called to,Read Back By and Verified With: JOANNE SCOTTON ON 07/17/2012 AT 12:22A BY WILEJ   Report Status PENDING   Incomplete    Organism ID, Bacteria STREPTOCOCCUS PNEUMONIAE   Final   CULTURE, EXPECTORATED SPUTUM-ASSESSMENT     Status: Normal   Collection Time   07/17/12  5:53 AM      Component Value Range Status Comment   Specimen Description SPUTUM   Final    Special Requests Normal   Final    Sputum evaluation     Final    Value: THIS SPECIMEN IS ACCEPTABLE. RESPIRATORY CULTURE REPORT TO FOLLOW.   Report Status 07/17/2012 FINAL   Final   CULTURE, RESPIRATORY     Status: Normal (Preliminary result)   Collection Time   07/17/12  5:53 AM      Component Value Range Status Comment   Specimen Description SPUTUM   Final    Special Requests NONE   Final    Gram Stain  Final    Value: RARE WBC PRESENT, PREDOMINANTLY MONONUCLEAR     FEW SQUAMOUS EPITHELIAL CELLS PRESENT     FEW GRAM NEGATIVE RODS     FEW GRAM POSITIVE COCCI IN PAIRS     RARE GRAM POSITIVE RODS   Culture ABUNDANT STREPTOCOCCUS PNEUMONIAE   Final    Report Status PENDING   Incomplete   CULTURE, BLOOD (ROUTINE X 2)     Status: Normal (Preliminary result)   Collection Time   07/19/12 11:15 AM      Component Value Range Status Comment   Specimen Description BLOOD RIGHT ARM   Final    Special Requests BOTTLES DRAWN AEROBIC AND ANAEROBIC [facility]   Final    Culture  Setup Time 07/19/2012 15:38   Final    Culture     Final    Value:        BLOOD CULTURE RECEIVED NO GROWTH TO DATE CULTURE WILL BE HELD FOR 5 DAYS BEFORE ISSUING A FINAL NEGATIVE REPORT   Report Status PENDING   Incomplete   CULTURE, BLOOD (ROUTINE X 2)     Status: Normal (Preliminary result)   Collection Time   07/19/12 11:30 AM      Component Value Range Status Comment   Specimen Description BLOOD RIGHT ARM   Final    Special Requests BOTTLES DRAWN AEROBIC AND ANAEROBIC [facility] EACH    Final    Culture  Setup Time 07/19/2012 15:39   Final    Culture     Final    Value:        BLOOD CULTURE RECEIVED NO GROWTH TO DATE CULTURE WILL BE HELD FOR 5 DAYS BEFORE ISSUING A FINAL NEGATIVE REPORT   Report Status PENDING   Incomplete     Studies/Results: Dg Chest 2 View  07/19/2012  *RADIOLOGY REPORT*  Clinical Data: Pneumonia, cough, fever, pleural effusion  CHEST - 2 VIEW  Comparison: 07/17/2012  Findings: Lingular/left lower lobe opacities, suspicious for pneumonia.  Suspected trace left pleural effusion.  No pneumothorax.  The heart is normal in size.  Very mild degenerative changes of the lower thoracic spine.  IMPRESSION: Lingular/left lower lobe opacities, suspicious for pneumonia.  Suspected trace left pleural effusion.   Original Report Authenticated By: Charline Bills, M.D.     Assessment/Plan: 1) Strep pneumonia CAP with bacteremia - improving fever curve.  Negative repeat culture.  Continue CTX for now.  Home with Amoxicillin 500 mg TID when afebrile to complete another 7 days.   -suseptibilities noted, int to penicillin but still 0.125.  Amoxicillin still adequate until > 2.    Staci Righter, MD [facility] [phone] pager   07/20/2012, 3:30 PM

## 2012-07-21 DIAGNOSIS — F1020 Alcohol dependence, uncomplicated: Secondary | ICD-10-CM

## 2012-07-21 LAB — CULTURE, RESPIRATORY W GRAM STAIN

## 2012-07-21 MED ORDER — POLYVINYL ALCOHOL 1.4 % OP SOLN
1.0000 [drp] | OPHTHALMIC | Status: DC | PRN
  Administered 2012-07-21: 1 [drp] via OPHTHALMIC
  Filled 2012-07-21: qty 15

## 2012-07-21 MED ORDER — CLONAZEPAM 0.5 MG PO TABS
0.5000 mg | ORAL_TABLET | Freq: Three times a day (TID) | ORAL | Status: DC
  Administered 2012-07-21 – 2012-07-22 (×3): 0.5 mg via ORAL
  Filled 2012-07-21 (×3): qty 1

## 2012-07-21 NOTE — Progress Notes (Signed)
[facility] PROGRESS NOTE  ALAND CHESTNUTT BJY:[id number] DOB: 1961-03-01 DOA: 07/16/2012 PCP: No primary provider on file.  Assessment/Plan: 1. Streptococcus Pneumonia  with bacteremia - complicating features on admission are chest pain, alcoholism, tobacco abuse,  hyponatremia , tachycardia, tachypnea. HIV NR. Patient started on iv rocephin and zithromax on admission 12/30 - and vancomycin added 12/31 . Urine strep antigen turned positive. Blood cultures positive 2/2 . zithromax DCed 07/18/12 . Vancomycin DCed 07/19/12 and patient changed to high dose iv Rocephin 2g iv q 12 hrs on 07/19/12 because he was having intermittent temp spikes.Surveillance Blood cultures were sent on 07/19/12 and there is no grohth to date. Echo ordered 07/19/12 did not show any large vegetations.  ID consultation called 07/19/12. Patient can be discharged home when he is no longer febrile with amoxicillin by mouth for another week 2. Tobacco abuse - counseled 3. Alcohol abuse counseled . Started folic acid and thiamine on admission. Developed withdrawal on 07/20/12 . Patient srated on klonopin TID 07/20/12  4. Pleuritic chest pain - suspect patient will develop para pneumonic effussion - f/u CXR closely. toradol iv for pain given for 3 days.  5. HTN - from pain   6. Hyponatremia - probably siadh due to pain - resolved.  7. Cocaine abuse -  8. Thrombocytopenia - mild  9. Migraine headaches-patient started on Fioricet on January 1 10. Constipation-probably due to IV narcotics-patient started on laxatives and Dilaudid discontinued on 07/18/12 - and constipation resolved   Code Status: full  Family Communication: patient Disposition Plan: home   Consultants:  none  Procedures:  none  Antibiotics: Rocephin 12/30  Zithromax 12/30-1/1 Vanc 12/31 -1/2  HPI/Subjective: Minimal chest pain, less anxious  Objective: Filed Vitals:   07/20/12 0613 07/20/12 1354 07/20/12 2127 07/21/12 0416  BP: 137/98 137/88 146/87 149/96    Pulse: 79 75 88 84  Temp: 98.3 F (36.8 C) 100.2 F (37.9 C) 100.5 F (38.1 C) 98.3 F (36.8 C)  TempSrc: Oral Oral Oral Oral  Resp: 18 18 18 18   Height:      Weight:      SpO2: 96% 97% 100% 95%    Intake/Output Summary (Last 24 hours) at 07/21/12 0852 Last data filed at 07/20/12 1800  Gross per 24 hour  Intake    270 ml  Output      0 ml  Net    270 ml   Filed Weights   07/16/12 0528  Weight: 59 kg (130 lb 1.1 oz)    Exam:   General:  axox3  Cardiovascular: tachy, reg, no murmurs   Respiratory: decreased sounds left base, no wheezes, crackles left base  barrel shaped chest , dull to percussion left base   Abdomen: soft, nt   Data Reviewed: Basic Metabolic Panel:  Lab 07/19/12 0865 07/17/12 0445 07/16/12 0123  NA 136 130* 131*  K 3.5 3.5 3.8  CL 102 99 95*  CO2 27 27 25   GLUCOSE 98 97 126*  BUN 13 26* 30*  CREATININE 0.77 0.87 0.93  CALCIUM 8.7 8.8 9.0  MG -- -- --  PHOS -- -- --   Liver Function Tests:  Lab 07/17/12 0445  AST 24  ALT 25  ALKPHOS 27*  BILITOT 0.7  PROT 6.2  ALBUMIN 2.4*   No results found for this basename: LIPASE:5,AMYLASE:5 in the last 168 hours No results found for this basename: AMMONIA:5 in the last 168 hours CBC:  Lab 07/19/12 0449 07/17/12 0445 07/16/12 0123  WBC  6.4 8.5 15.7*  NEUTROABS -- 6.6 --  HGB 12.5* 11.4* 14.3  HCT 35.9* 33.7* 40.6  MCV 92.8 96.0 92.3  PLT 217 144* 181   Cardiac Enzymes:  Lab 07/16/12 0745 07/16/12 0123  CKTOTAL -- --  CKMB -- --  CKMBINDEX -- --  TROPONINI <0.30 <0.30   BNP (last 3 results) No results found for this basename: PROBNP:3 in the last 8760 hours CBG: No results found for this basename: GLUCAP:5 in the last 168 hours  Recent Results (from the past 240 hour(s))  CULTURE, BLOOD (ROUTINE X 2)     Status: Normal   Collection Time   07/16/12  3:36 AM      Component Value Range Status Comment   Specimen Description BLOOD RIGHT ANTECUBITAL   Final    Special Requests  BOTTLES DRAWN AEROBIC AND ANAEROBIC [facility]   Final    Culture  Setup Time 07/16/2012 08:52   Final    Culture     Final    Value: STREPTOCOCCUS PNEUMONIAE     Note: SUSCEPTIBILITIES PERFORMED ON PREVIOUS CULTURE WITHIN THE LAST 5 DAYS.     Note: Gram Stain Report Called to,Read Back By and Verified With: JOANNE SCOTTON ON 07/17/2012 AT 12:23A BY WILEJ   Report Status 07/20/2012 FINAL   Final   CULTURE, BLOOD (ROUTINE X 2)     Status: Normal (Preliminary result)   Collection Time   07/16/12  3:42 AM      Component Value Range Status Comment   Specimen Description BLOOD LEFT ANTECUBITAL   Final    Special Requests BOTTLES DRAWN AEROBIC AND ANAEROBIC [facility]   Final    Culture  Setup Time 07/16/2012 08:52   Final    Culture     Final    Value: STREPTOCOCCUS PNEUMONIAE     Note: CEFTRIAXONE TO FOLLOW     Note: Gram Stain Report Called to,Read Back By and Verified With: JOANNE SCOTTON ON 07/17/2012 AT 12:22A BY WILEJ   Report Status PENDING   Incomplete    Organism ID, Bacteria STREPTOCOCCUS PNEUMONIAE   Final   CULTURE, EXPECTORATED SPUTUM-ASSESSMENT     Status: Normal   Collection Time   07/17/12  5:53 AM      Component Value Range Status Comment   Specimen Description SPUTUM   Final    Special Requests Normal   Final    Sputum evaluation     Final    Value: THIS SPECIMEN IS ACCEPTABLE. RESPIRATORY CULTURE REPORT TO FOLLOW.   Report Status 07/17/2012 FINAL   Final   CULTURE, RESPIRATORY     Status: Normal (Preliminary result)   Collection Time   07/17/12  5:53 AM      Component Value Range Status Comment   Specimen Description SPUTUM   Final    Special Requests NONE   Final    Gram Stain     Final    Value: RARE WBC PRESENT, PREDOMINANTLY MONONUCLEAR     FEW SQUAMOUS EPITHELIAL CELLS PRESENT     FEW GRAM NEGATIVE RODS     FEW GRAM POSITIVE COCCI IN PAIRS     RARE GRAM POSITIVE RODS   Culture ABUNDANT STREPTOCOCCUS PNEUMONIAE   Final    Report Status PENDING   Incomplete   CULTURE,  BLOOD (ROUTINE X 2)     Status: Normal (Preliminary result)   Collection Time   07/19/12 11:15 AM      Component Value Range Status Comment   Specimen Description  BLOOD RIGHT ARM   Final    Special Requests BOTTLES DRAWN AEROBIC AND ANAEROBIC [facility]   Final    Culture  Setup Time 07/19/2012 15:38   Final    Culture     Final    Value:        BLOOD CULTURE RECEIVED NO GROWTH TO DATE CULTURE WILL BE HELD FOR 5 DAYS BEFORE ISSUING A FINAL NEGATIVE REPORT   Report Status PENDING   Incomplete   CULTURE, BLOOD (ROUTINE X 2)     Status: Normal (Preliminary result)   Collection Time   07/19/12 11:30 AM      Component Value Range Status Comment   Specimen Description BLOOD RIGHT ARM   Final    Special Requests BOTTLES DRAWN AEROBIC AND ANAEROBIC [facility] EACH   Final    Culture  Setup Time 07/19/2012 15:39   Final    Culture     Final    Value:        BLOOD CULTURE RECEIVED NO GROWTH TO DATE CULTURE WILL BE HELD FOR 5 DAYS BEFORE ISSUING A FINAL NEGATIVE REPORT   Report Status PENDING   Incomplete      Studies: Dg Chest 2 View  07/19/2012  *RADIOLOGY REPORT*  Clinical Data: Pneumonia, cough, fever, pleural effusion  CHEST - 2 VIEW  Comparison: 07/17/2012  Findings: Lingular/left lower lobe opacities, suspicious for pneumonia.  Suspected trace left pleural effusion.  No pneumothorax.  The heart is normal in size.  Very mild degenerative changes of the lower thoracic spine.  IMPRESSION: Lingular/left lower lobe opacities, suspicious for pneumonia.  Suspected trace left pleural effusion.   Original Report Authenticated By: Charline Bills, M.D.     Scheduled Meds:    . cefTRIAXone (ROCEPHIN)  IV  2 g Intravenous Q12H  . clonazePAM  1 mg Oral TID  . enoxaparin (LOVENOX) injection  40 mg Subcutaneous Q24H  . folic acid  1 mg Oral Daily  . nicotine  14 mg Transdermal Daily  . thiamine  100 mg Oral Daily   Continuous Infusions:    . sodium chloride 20 mL/hr (07/19/12 2116)    Principal  Problem:  *CAP (community acquired pneumonia) Active Problems:  Chest pain  Hyponatremia  Bacteremia due to Streptococcus pneumoniae  Alcoholism /alcohol abuse      Leilene Diprima  [facility] Pager [phone]. If 8PM-8AM, please contact night-coverage at [email or web address], password [facility] 07/21/2012, 8:52 AM  LOS: 5 days

## 2012-07-22 ENCOUNTER — Inpatient Hospital Stay (HOSPITAL_COMMUNITY): Payer: MEDICAID

## 2012-07-22 LAB — CULTURE, BLOOD (ROUTINE X 2)

## 2012-07-22 LAB — CBC
HCT: 35 % — ABNORMAL LOW (ref 39.0–52.0)
Hemoglobin: 12.1 g/dL — ABNORMAL LOW (ref 13.0–17.0)
MCH: 32.3 pg (ref 26.0–34.0)
MCV: 93.3 fL (ref 78.0–100.0)
RBC: 3.75 MIL/uL — ABNORMAL LOW (ref 4.22–5.81)
WBC: 7.6 10*3/uL (ref 4.0–10.5)

## 2012-07-22 LAB — BASIC METABOLIC PANEL
CO2: 25 mEq/L (ref 19–32)
Chloride: 101 mEq/L (ref 96–112)
Glucose, Bld: 108 mg/dL — ABNORMAL HIGH (ref 70–99)
Sodium: 134 mEq/L — ABNORMAL LOW (ref 135–145)

## 2012-07-22 MED ORDER — AMOXICILLIN 500 MG PO CAPS
500.0000 mg | ORAL_CAPSULE | Freq: Three times a day (TID) | ORAL | Status: DC
  Administered 2012-07-22: 500 mg via ORAL
  Filled 2012-07-22 (×3): qty 1

## 2012-07-22 MED ORDER — AMOXICILLIN 500 MG PO CAPS: 500.0000 mg | ORAL_CAPSULE | Freq: Three times a day (TID) | ORAL | Status: DC

## 2012-07-22 MED ORDER — FOLIC ACID 1 MG PO TABS: 1.0000 mg | ORAL_TABLET | Freq: Every day | ORAL | Status: DC

## 2012-07-22 MED ORDER — THIAMINE HCL 100 MG PO TABS: 100.0000 mg | ORAL_TABLET | Freq: Every day | ORAL | Status: DC

## 2012-07-22 MED ORDER — VITAMINS A & D EX OINT
TOPICAL_OINTMENT | CUTANEOUS | Status: AC
  Administered 2012-07-22: 04:00:00
  Filled 2012-07-22: qty 5

## 2012-07-22 MED ORDER — IBUPROFEN 200 MG PO TABS: 400.0000 mg | ORAL_TABLET | Freq: Four times a day (QID) | ORAL | Status: DC | PRN

## 2012-07-22 MED ORDER — NICOTINE 14 MG/24HR TD PT24: 1.0000 | MEDICATED_PATCH | Freq: Every day | TRANSDERMAL | Status: DC

## 2012-07-22 NOTE — Progress Notes (Signed)
[facility]  Date of Admission:  07/16/2012  Antibiotics: Ceftriaxone 2 grams q 24 hours  Subjective: Complains of left sided pleuritic flank and chest pain with coughing; asking when he can go home  Objective: Temp:  [97.9 F (36.6 C)-98.2 F (36.8 C)] 98 F (36.7 C) (01/05 0512) Pulse Rate:  [75-81] 80  (01/05 0512) Resp:  [18] 18  (01/05 0512) BP: (114-134)/(69-86) 130/70 mmHg (01/05 0512) SpO2:  [97 %-100 %] 98 % (01/05 0512)  General: awake, alert, nad Lungs: CTA B  Lab Results Lab Results  Component Value Date   WBC 7.6 07/22/2012   HGB 12.1* 07/22/2012   HCT 35.0* 07/22/2012   MCV 93.3 07/22/2012   PLT 360 07/22/2012    Lab Results  Component Value Date   CREATININE 0.65 07/22/2012   BUN 12 07/22/2012   NA 134* 07/22/2012   K 3.6 07/22/2012   CL 101 07/22/2012   CO2 25 07/22/2012    Lab Results  Component Value Date   ALT 25 07/17/2012   AST 24 07/17/2012   ALKPHOS 27* 07/17/2012   BILITOT 0.7 07/17/2012      Microbiology: Recent Results (from the past 240 hour(s))  CULTURE, BLOOD (ROUTINE X 2)     Status: Normal   Collection Time   07/16/12  3:36 AM      Component Value Range Status Comment   Specimen Description BLOOD RIGHT ANTECUBITAL   Final    Special Requests BOTTLES DRAWN AEROBIC AND ANAEROBIC [facility]   Final    Culture  Setup Time 07/16/2012 08:52   Final    Culture     Final    Value: STREPTOCOCCUS PNEUMONIAE     Note: SUSCEPTIBILITIES PERFORMED ON PREVIOUS CULTURE WITHIN THE LAST 5 DAYS.     Note: Gram Stain Report Called to,Read Back By and Verified With: JOANNE SCOTTON ON 07/17/2012 AT 12:23A BY WILEJ   Report Status 07/20/2012 FINAL   Final   CULTURE, BLOOD (ROUTINE X 2)     Status: Normal   Collection Time   07/16/12  3:42 AM      Component Value Range Status Comment   Specimen Description BLOOD LEFT ANTECUBITAL   Final    Special Requests BOTTLES DRAWN AEROBIC AND ANAEROBIC [facility]   Final    Culture  Setup Time 07/16/2012  08:52   Final    Culture     Final    Value: STREPTOCOCCUS PNEUMONIAE     Note: Gram Stain Report Called to,Read Back By and Verified With: Karie Schwalbe ON 07/17/2012 AT 12:22A BY WILEJ   Report Status 07/22/2012 FINAL   Final    Organism ID, Bacteria STREPTOCOCCUS PNEUMONIAE   Final   CULTURE, EXPECTORATED SPUTUM-ASSESSMENT     Status: Normal   Collection Time   07/17/12  5:53 AM      Component Value Range Status Comment   Specimen Description SPUTUM   Final    Special Requests Normal   Final    Sputum evaluation     Final    Value: THIS SPECIMEN IS ACCEPTABLE. RESPIRATORY CULTURE REPORT TO FOLLOW.   Report Status 07/17/2012 FINAL   Final   CULTURE, RESPIRATORY     Status: Normal   Collection Time   07/17/12  5:53 AM      Component Value Range Status Comment   Specimen Description SPUTUM   Final    Special Requests NONE   Final    Gram Stain  Final    Value: RARE WBC PRESENT, PREDOMINANTLY MONONUCLEAR     FEW SQUAMOUS EPITHELIAL CELLS PRESENT     FEW GRAM NEGATIVE RODS     FEW GRAM POSITIVE COCCI IN PAIRS     RARE GRAM POSITIVE RODS   Culture ABUNDANT STREPTOCOCCUS PNEUMONIAE   Final    Report Status 07/21/2012 FINAL   Final    Organism ID, Bacteria STREPTOCOCCUS PNEUMONIAE   Final   CULTURE, BLOOD (ROUTINE X 2)     Status: Normal (Preliminary result)   Collection Time   07/19/12 11:15 AM      Component Value Range Status Comment   Specimen Description BLOOD RIGHT ARM   Final    Special Requests BOTTLES DRAWN AEROBIC AND ANAEROBIC [facility] E[facility]   Final    Culture  Setup Time 07/19/2012 15:38   Final    Culture     Final    Value:        BLOOD CULTURE RECEIVED NO GROWTH TO DATE CULTURE WILL BE HELD FOR 5 DAYS BEFORE ISSUING A FINAL NEGATIVE REPORT   Report Status PENDING   Incomplete   CULTURE, BLOOD (ROUTINE X 2)     Status: Normal (Preliminary result)   Collection Time   07/19/12 11:30 AM      Component Value Range Status Comment   Specimen Description BLOOD RIGHT ARM    Final    Special Requests BOTTLES DRAWN AEROBIC AND ANAEROBIC [facility] EACH   Final    Culture  Setup Time 07/19/2012 15:39   Final    Culture     Final    Value:        BLOOD CULTURE RECEIVED NO GROWTH TO DATE CULTURE WILL BE HELD FOR 5 DAYS BEFORE ISSUING A FINAL NEGATIVE REPORT   Report Status PENDING   Incomplete     Studies/Results: Dg Chest 2 View  07/22/2012  *RADIOLOGY REPORT*  Clinical Data: Inspiratory chest pain  CHEST - 2 VIEW  Comparison: 07/19/2012  Findings: Slight increase in left lower lobe consolidation and left trace pleural effusion.  Heart size normal.  Right lung remains clear.  IMPRESSION: Increased left lower lobe airspace consolidation most compatible with pneumonia.   Original Report Authenticated By: Christiana Pellant, M.D.     Assessment/Plan: 1) Strep pneumonia CAP with bacteremia - now afebrile > 24 hours.  Repeat blood cultures negative.   -change to amoxicillin po for another 7 days. -will sign off, please call with questions.   Staci Righter, MD [facility] [phone] pager   07/22/2012, 11:13 AM

## 2012-07-22 NOTE — Discharge Summary (Signed)
Physician Discharge Summary  Christopher Logan ZOX:[id number] DOB: 1960/09/14 DOA: 07/16/2012  PCP: No primary provider on file.  Admit date: 07/16/2012 Discharge date: 07/22/2012  Time spent: Greater than 30 minutes  Recommendations for Outpatient Follow-up:  1. With new Primary Medical Doctor, in one week from hospital discharge. Patient has been provided with information to find new PCP. 2. Patient advised to followup chest x-ray with his PCP in a couple of weeks from discharge to ensure resolution of pneumonia findings. He verbalizes understanding.  Discharge Diagnoses:  Principal Problem:  *CAP (community acquired pneumonia) Active Problems:  Chest pain  Hyponatremia  Bacteremia due to Streptococcus pneumoniae  Alcoholism /alcohol abuse   Discharge Condition: Improved and stable.  Diet recommendation: Heart healthy diet.  Filed Weights   07/16/12 0528  Weight: 59 kg (130 lb 1.1 oz)    History of present illness:  52 year old male patient with history of polysubstance abuse-alcohol, cocaine and tobacco was admitted on 12/30 with left-sided pleuritic chest pain and nonproductive cough. Chest x-ray showed left-sided pneumonia. He denied fevers but had elevated white cell count. He was admitted for further evaluation and management.  Hospital Course:   Strep pneumoniae CAP with bacteremia  Patient was started on empiric IV azithromycin and Rocephin. Cultures were sent off. When his blood cultures came back positive for strep pneumonia, infectious disease was consulted. Azithromycin was discontinued. Patient was continued on IV Rocephin. Transthoracic 2-D echo did not show any large vegetations. Dr. Luciana Axe has seen the patient today and switched him to oral amoxicillin to complete additional 7 days therapy and has cleared him for discharge. Patient complains of mild intermittent left-sided pleuritic chest pain. Cough is mostly dry. Patient has been afebrile for greater than 24 hours.  Surveillance blood cultures are negative to date. He is advised to followup with his PCP in 1 week from discharge. He is advised to use as needed OTC ibuprofen for a couple of days for pleuritic chest pain. He will need followup chest x-rays in a couple of weeks to ensure resolution of pneumonia findings.  Polysubstance abuse-alcohol, tobacco, THC and cocaine  Cessation from all these substances was counseled and patient verbalizes understanding. He had some withdrawal symptoms on 07/20/12 which have since resolved. He will be discharged on thiamine and vitamin B 12.  Hyponatremia  ? Beer portomania. Improved.  Thrombocytopenia  Likely secondary to alcohol abuse. Resolved.  ? History of migraine headaches  Occasional headaches at this time. Advised when necessary ibuprofen but counseled not to overuse more than a couple of days due to risk of side effects including stomach ulcers, bleeding and renal insufficiency. Patient verbalizes understanding.   Consultants:  Infectious disease  Procedures:  none  Antibiotics:  Rocephin 12/30-1/5  Zithromax 12/30-1/1  Vanc 12/31 -1/2  Discharge Exam:  Complaints Intermittent left-sided chest pain, worse with coughing and deep breaths-but better than on admission. Denies dyspnea. Cough is nonproductive. Ambulating halls. Good oral intake.   Filed Vitals:   07/21/12 0416 07/21/12 1450 07/21/12 2103 07/22/12 0512  BP: 149/96 134/86 114/69 130/70  Pulse: 84 75 81 80  Temp: 98.3 F (36.8 C) 98.2 F (36.8 C) 97.9 F (36.6 C) 98 F (36.7 C)  TempSrc: Oral Oral Oral Oral  Resp: 18 18 18 18   Height:      Weight:      SpO2: 95% 100% 97% 98%     General exam: Appears comfortable without distress.  Respiratory system: Slightly reduced breath sounds in the left base  with occasional crackles but no pleural rub. Rest of lung fields are clear to auscultation. No increased work of breathing.  Cardiovascular system: First and second heart  sounds heard, regular rate and rhythm. Telemetry shows normal sinus rhythm. No JVD, murmurs or pedal edema.  Gastrointestinal system: Abdomen is nondistended, soft and nontender. Normal bowel sounds heard  Central nervous system: Alert and oriented. No focal neurological deficits.  Discharge Instructions      Discharge Orders    Future Orders Please Complete By Expires   Diet - low sodium heart healthy      Increase activity slowly      Call MD for:  temperature >100.4      Call MD for:  severe uncontrolled pain      Call MD for:  difficulty breathing, headache or visual disturbances          Medication List     As of 07/22/2012  1:09 PM    STOP taking these medications         GOODYS BODY PAIN PO      TAKE these medications         amoxicillin 500 MG capsule   Commonly known as: AMOXIL   Take 1 capsule (500 mg total) by mouth every 8 (eight) hours.      folic acid 1 MG tablet   Commonly known as: FOLVITE   Take 1 tablet (1 mg total) by mouth daily.      ibuprofen 200 MG tablet   Commonly known as: ADVIL,MOTRIN   Take 2 tablets (400 mg total) by mouth every 6 (six) hours as needed for pain.      nicotine 14 mg/24hr patch   Commonly known as: NICODERM CQ - dosed in mg/24 hours   Place 1 patch onto the skin daily.      thiamine 100 MG tablet   Take 1 tablet (100 mg total) by mouth daily.        Follow-up Information    Follow up with Primary Medical Doctor. Schedule an appointment as soon as possible for a visit in 1 week. (Call using phone number provided, to find new Primary MD)           The results of significant diagnostics from this hospitalization (including imaging, microbiology, ancillary and laboratory) are listed below for reference.    Significant Diagnostic Studies: Dg Chest 2 View  07/22/2012  *RADIOLOGY REPORT*  Clinical Data: Inspiratory chest pain  CHEST - 2 VIEW  Comparison: 07/19/2012  Findings: Slight increase in left lower lobe  consolidation and left trace pleural effusion.  Heart size normal.  Right lung remains clear.  IMPRESSION: Increased left lower lobe airspace consolidation most compatible with pneumonia.   Original Report Authenticated By: Christiana Pellant, M.D.    Dg Chest 2 View  07/19/2012  *RADIOLOGY REPORT*  Clinical Data: Pneumonia, cough, fever, pleural effusion  CHEST - 2 VIEW  Comparison: 07/17/2012  Findings: Lingular/left lower lobe opacities, suspicious for pneumonia.  Suspected trace left pleural effusion.  No pneumothorax.  The heart is normal in size.  Very mild degenerative changes of the lower thoracic spine.  IMPRESSION: Lingular/left lower lobe opacities, suspicious for pneumonia.  Suspected trace left pleural effusion.   Original Report Authenticated By: Charline Bills, M.D.    Dg Chest 2 View  07/17/2012  *RADIOLOGY REPORT*  Clinical Data: Left chest pain, evaluate pleural effusion  CHEST - 2 VIEW  Comparison: CT chest dated 07/16/2012  Findings: Lingular opacity, suspicious  for pneumonia when correlating with CT.  Mild left basilar opacity, atelectasis versus pneumonia.  Possible trace left pleural effusion.  No pneumothorax.  The heart is normal in size.  Visualized osseous structures are within normal limits.  IMPRESSION: Lingular pneumonia.  Mild left basilar opacity, atelectasis versus pneumonia.  Possible trace left pleural effusion.   Original Report Authenticated By: Charline Bills, M.D.    Ct Angio Chest W/cm &/or Wo Cm  07/16/2012  *RADIOLOGY REPORT*  Clinical Data: Sudden onset of left-sided chest pain.  Cough. Leukocytosis.  History of smoking.  CT ANGIOGRAPHY CHEST  Technique:  Multidetector CT imaging of the chest using the standard protocol during bolus administration of intravenous contrast. Multiplanar reconstructed images including MIPs were obtained and reviewed to evaluate the vascular anatomy.  Contrast: OMNIPAQUE IOHEXOL 350 MG/ML SOLN  Comparison: Chest radiograph  performed earlier today at 01:28 a.m.  Findings: There is no evidence of pulmonary embolus.  There is focal airspace opacification involving the posterior aspect of the left upper lobe, with minimal patchy opacity at the left lower lobe, compatible with multifocal pneumonia.  Minimal right basilar atelectasis is seen.  There is no evidence of pleural effusion or pneumothorax.  No masses are identified; no abnormal focal contrast enhancement is seen.  Scattered coronary artery calcification is noted.  Vague soft tissue density is noted in the subcarinal region, measuring 1.2 cm in short axis.  A 1.1 cm aortopulmonary window node is seen. Additional scattered mediastinal nodes remain borderline normal in size. No pericardial effusion is identified.  The great vessels demonstrate mild scattered calcification, but are otherwise grossly unremarkable.  No axillary lymphadenopathy is seen.  The visualized portions of the thyroid gland are unremarkable in appearance.  The visualized portions of the liver and spleen are unremarkable.  No acute osseous abnormalities are seen.  IMPRESSION:  1.  No evidence of pulmonary embolus. 2.  Focal left upper lobe and minimal left lower lobe pneumonia. 3.  Minimal right basilar atelectasis seen. 4.  Scattered coronary artery calcification noted. 5.  Vague soft tissue density at the subcarinal region, measuring 1.2 cm in short axis, and 1.1 cm aortopulmonary window node.  This may reflect the acute infectious process, but remains nonspecific.   Original Report Authenticated By: Tonia Ghent, M.D.    Dg Chest Portable 1 View  07/16/2012  *RADIOLOGY REPORT*  Clinical Data: Shortness of breath.  Chest pain.  PORTABLE CHEST - 1 VIEW  Comparison: None.  Findings: Asymmetric airspace opacity is seen in the peripheral left midlung.  Differential diagnosis includes pneumonia, hemorrhage, and infarct.  Right lung is clear.  No evidence of pleural effusion.  Heart size is normal.  IMPRESSION:   Left mid lung airspace opacity.  Differential diagnosis includes pneumonia, hemorrhage, and infarct.   Original Report Authenticated By: Myles Rosenthal, M.D.     Microbiology: Recent Results (from the past 240 hour(s))  CULTURE, BLOOD (ROUTINE X 2)     Status: Normal   Collection Time   07/16/12  3:36 AM      Component Value Range Status Comment   Specimen Description BLOOD RIGHT ANTECUBITAL   Final    Special Requests BOTTLES DRAWN AEROBIC AND ANAEROBIC [facility]   Final    Culture  Setup Time 07/16/2012 08:52   Final    Culture     Final    Value: STREPTOCOCCUS PNEUMONIAE     Note: SUSCEPTIBILITIES PERFORMED ON PREVIOUS CULTURE WITHIN THE LAST 5 DAYS.     Note:  Gram Stain Report Called to,Read Back By and Verified With: JOANNE SCOTTON ON 07/17/2012 AT 12:23A BY WILEJ   Report Status 07/20/2012 FINAL   Final   CULTURE, BLOOD (ROUTINE X 2)     Status: Normal   Collection Time   07/16/12  3:42 AM      Component Value Range Status Comment   Specimen Description BLOOD LEFT ANTECUBITAL   Final    Special Requests BOTTLES DRAWN AEROBIC AND ANAEROBIC [facility]   Final    Culture  Setup Time 07/16/2012 08:52   Final    Culture     Final    Value: STREPTOCOCCUS PNEUMONIAE     Note: Gram Stain Report Called to,Read Back By and Verified With: Karie Schwalbe ON 07/17/2012 AT 12:22A BY WILEJ   Report Status 07/22/2012 FINAL   Final    Organism ID, Bacteria STREPTOCOCCUS PNEUMONIAE   Final   CULTURE, EXPECTORATED SPUTUM-ASSESSMENT     Status: Normal   Collection Time   07/17/12  5:53 AM      Component Value Range Status Comment   Specimen Description SPUTUM   Final    Special Requests Normal   Final    Sputum evaluation     Final    Value: THIS SPECIMEN IS ACCEPTABLE. RESPIRATORY CULTURE REPORT TO FOLLOW.   Report Status 07/17/2012 FINAL   Final   CULTURE, RESPIRATORY     Status: Normal   Collection Time   07/17/12  5:53 AM      Component Value Range Status Comment   Specimen Description SPUTUM   Final     Special Requests NONE   Final    Gram Stain     Final    Value: RARE WBC PRESENT, PREDOMINANTLY MONONUCLEAR     FEW SQUAMOUS EPITHELIAL CELLS PRESENT     FEW GRAM NEGATIVE RODS     FEW GRAM POSITIVE COCCI IN PAIRS     RARE GRAM POSITIVE RODS   Culture ABUNDANT STREPTOCOCCUS PNEUMONIAE   Final    Report Status 07/21/2012 FINAL   Final    Organism ID, Bacteria STREPTOCOCCUS PNEUMONIAE   Final   CULTURE, BLOOD (ROUTINE X 2)     Status: Normal (Preliminary result)   Collection Time   07/19/12 11:15 AM      Component Value Range Status Comment   Specimen Description BLOOD RIGHT ARM   Final    Special Requests BOTTLES DRAWN AEROBIC AND ANAEROBIC [facility] EACH   Final    Culture  Setup Time 07/19/2012 15:38   Final    Culture     Final    Value:        BLOOD CULTURE RECEIVED NO GROWTH TO DATE CULTURE WILL BE HELD FOR 5 DAYS BEFORE ISSUING A FINAL NEGATIVE REPORT   Report Status PENDING   Incomplete   CULTURE, BLOOD (ROUTINE X 2)     Status: Normal (Preliminary result)   Collection Time   07/19/12 11:30 AM      Component Value Range Status Comment   Specimen Description BLOOD RIGHT ARM   Final    Special Requests BOTTLES DRAWN AEROBIC AND ANAEROBIC [facility]CC EACH   Final    Culture  Setup Time 07/19/2012 15:39   Final    Culture     Final    Value:        BLOOD CULTURE RECEIVED NO GROWTH TO DATE CULTURE WILL BE HELD FOR 5 DAYS BEFORE ISSUING A FINAL NEGATIVE REPORT   Report Status  PENDING   Incomplete      Labs: Basic Metabolic Panel:  Lab 07/22/12 5409 07/19/12 0449 07/17/12 0445 07/16/12 0123  NA 134* 136 130* 131*  K 3.6 3.5 3.5 3.8  CL 101 102 99 95*  CO2 25 27 27 25   GLUCOSE 108* 98 97 126*  BUN 12 13 26* 30*  CREATININE 0.65 0.77 0.87 0.93  CALCIUM 8.4 8.7 8.8 9.0  MG -- -- -- --  PHOS -- -- -- --   Liver Function Tests:  Lab 07/17/12 0445  AST 24  ALT 25  ALKPHOS 27*  BILITOT 0.7  PROT 6.2  ALBUMIN 2.4*   No results found for this basename: LIPASE:5,AMYLASE:5 in the last  168 hours No results found for this basename: AMMONIA:5 in the last 168 hours CBC:  Lab 07/22/12 0446 07/19/12 0449 07/17/12 0445 07/16/12 0123  WBC 7.6 6.4 8.5 15.7*  NEUTROABS -- -- 6.6 --  HGB 12.1* 12.5* 11.4* 14.3  HCT 35.0* 35.9* 33.7* 40.6  MCV 93.3 92.8 96.0 92.3  PLT 360 217 144* 181   Cardiac Enzymes:  Lab 07/16/12 0745 07/16/12 0123  CKTOTAL -- --  CKMB -- --  CKMBINDEX -- --  TROPONINI <0.30 <0.30   BNP: BNP (last 3 results) No results found for this basename: PROBNP:3 in the last 8760 hours CBG: No results found for this basename: GLUCAP:5 in the last 168 hours   Other lab data  HIV: Nonreactive  Urine streptococcal antigen: Positive.  Urine Legionella antigen negative.  UDS: Positive for cocaine and THC.  2-D echo:Study Conclusions  - Left ventricle: The cavity size was normal. Systolic function was normal. The estimated ejection fraction was in the range of 60% to 65%. Wall motion was normal; there were no regional wall motion abnormalities. - Left atrium: The atrium was mildly dilated. Impressions:  - No large vegetations are noted. If strong clinical suspicioun for SBE a TEE would give additional information  Signed:  Veyda Kaufman  [facility] 07/22/2012, 1:09 PM

## 2012-07-22 NOTE — Progress Notes (Signed)
CM spoke with patient concerning medication assistance. Pt uninsured, RX provided by MD generic or over-the-counter. Pt explained requirements for medication assistance. Pt provided with [facility]ico[facility] generic drug list for future reference. Pt's family to provide tx home. Pt referred to DSS for assistance with medical insurance. No other needs stated.  Leonie Green [phone]

## 2012-07-25 LAB — CULTURE, BLOOD (ROUTINE X 2): Culture: NO GROWTH

## 2013-05-08 ENCOUNTER — Encounter (HOSPITAL_COMMUNITY): Payer: Self-pay | Admitting: Emergency Medicine

## 2013-05-08 ENCOUNTER — Emergency Department (INDEPENDENT_AMBULATORY_CARE_PROVIDER_SITE_OTHER)
Admission: EM | Admit: 2013-05-08 | Discharge: 2013-05-08 | Disposition: A | Discharge status: Home / Self Care | Payer: Self-pay | Source: Home / Self Care

## 2013-05-08 DIAGNOSIS — R030 Elevated blood-pressure reading, without diagnosis of hypertension: Secondary | ICD-10-CM

## 2013-05-08 DIAGNOSIS — Z23 Encounter for immunization: Secondary | ICD-10-CM

## 2013-05-08 DIAGNOSIS — IMO0001 Reserved for inherently not codable concepts without codable children: Secondary | ICD-10-CM

## 2013-05-08 LAB — POCT I-STAT, CHEM 8
BUN: 10 mg/dL (ref 6–23)
Calcium, Ion: 1.23 mmol/L (ref 1.12–1.23)
Chloride: 103 mEq/L (ref 96–112)
Creatinine, Ser: 1 mg/dL (ref 0.50–1.35)
Glucose, Bld: 92 mg/dL (ref 70–99)

## 2013-05-08 MED ORDER — ISOSORBIDE MONONITRATE ER 30 MG PO TB24: 30.0000 mg | ORAL_TABLET | Freq: Every day | ORAL | Status: DC

## 2013-05-08 MED ORDER — CLONIDINE HCL 0.1 MG PO TABS
0.2000 mg | ORAL_TABLET | Freq: Once | ORAL | Status: AC
  Administered 2013-05-08: 0.2 mg via ORAL

## 2013-05-08 MED ORDER — LISINOPRIL 40 MG PO TABS: 40.0000 mg | ORAL_TABLET | Freq: Every day | ORAL | Status: DC

## 2013-05-08 MED ORDER — INFLUENZA VAC SPLIT QUAD 0.5 ML IM SUSP
0.5000 mL | INTRAMUSCULAR | Status: AC
  Administered 2013-05-08: 0.5 mL via INTRAMUSCULAR

## 2013-05-08 MED ORDER — CLONIDINE HCL 0.1 MG PO TABS
ORAL_TABLET | ORAL | Status: AC
  Filled 2013-05-08: qty 2

## 2013-05-08 MED ORDER — ISOSORB DINITRATE-HYDRALAZINE 20-37.5 MG PO TABS: 1.0000 | ORAL_TABLET | Freq: Three times a day (TID) | ORAL | Status: DC

## 2013-05-08 NOTE — ED Notes (Signed)
Pt  Reports   Symptoms  Of    High  Blood  Pressure      He  Says  That  His  Girl  Friend  Checked  His  Blood  Pressure     At  Home  And  It  Was  High          He  Has  No  pcp          And  Is  On no  meds      He  Has  Had  Some  High   Headache  At  Times  With  Somewhat  Blurred  Vision as  Well

## 2013-05-08 NOTE — ED Provider Notes (Signed)
CSN: [id number]     Arrival date & time 05/08/13  1035 History   None    Chief Complaint  Patient presents with  . Hypertension   HPI 52 year old Caucasian male, prior history of substance abuse and use of cocaine, continued tobacco use, alcohol daily use [2-3  12 oz Bud[employer]/daY] presents to urgent care Center because of hypertension He was requested to do this by his significant other, who is hypertensive and was noted at home his blood rashes have been 160/100 with her machine. 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 At that time he was also found to have hypernatremia and thrombocytopenia and was thought to have migraine headaches  History reviewed. No pertinent past medical history. History reviewed. No pertinent past surgical history. Family History  Problem Relation Age of Onset  . Cancer Mother    History  Substance Use Topics  . Smoking status: Current Every Day Smoker -- 1.00 packs/day    Types: Cigarettes  . Smokeless tobacco: Not on file  . Alcohol Use: Yes     Comment: 40 oz of beer a day.     Review of Systems  No chest pain, no shortness of breath no nausea no vomiting occasional blurred vision Occasional headache No diarrhea no chills no fever no falls no weakness   Allergies  Codeine  Home Medications   Current Outpatient Rx  Name  Route  Sig  Dispense  Refill  . amoxicillin (AMOXIL) 500 MG capsule   Oral   Take 1 capsule (500 mg total) by mouth  every 8 (eight) hours.   21 capsule   0   . folic acid (FOLVITE) 1 MG tablet   Oral   Take 1 tablet (1 mg total) by mouth daily.   30 tablet   0   . ibuprofen (ADVIL,MOTRIN) 200 MG tablet   Oral   Take 2 tablets (400 mg total) by mouth every 6 (six) hours as needed for pain.         . nicotine (NICODERM CQ - DOSED IN MG/24 HOURS) 14 mg/24hr patch   Transdermal   Place 1 patch onto the skin daily.   28 patch   0   . thiamine 100 MG tablet   Oral   Take 1 tablet (100 mg total) by mouth daily.   30 tablet   0    BP 160/103  Pulse 81  Temp(Src) 97.4 F (36.3 C) (Oral)  Resp 20  SpO2 97% Physical Exam Repeat blood pressure 165/110 Alert pleasant oriented funduscopy unsuccessful Chest clinically clear S1-S2 no murmur rub or gallop Abdomen soft nontender nondistended Lower extremities range of motion intact    ED Course  Procedures (including critical care time) Labs Review Labs Reviewed - No data to display Imaging Review No results found.  EKG Interpretation     Ventricular Rate:    PR Interval:    QRS Duration:   QT Interval:    QTC  Calculation:   R Axis:     Text Interpretation:              MDM  No diagnosis found. 11:20 AM-patient given clonidine 0.2 mg at around 1110, will reassess blood pressure get i-STAT and make recommendations for further medication management  Patient has been counseled strongly about smoking and alcohol use which  can worsen blood pressure and put him at high risk for stroke  He finally admits to me that he used cocaine one time last week  11:45 AM Blood pressure recheck with clonidine being given is still 160/100. I will give him a prescription for lisinopril 40 mg as well as Imdur 30 mg and limited in terms of what I can get him as his creatinine is 1.0. I have made him aware that he will need lab work done in about one week's time at primary care physician's office, and that he needs absolutely cease and desist  from cocaine use. Have explained to him and that he is at high risk for strokes and heart attacks and once again needs to abstain.  He understands clearly what Aricept 10     Rhetta Mura, MD 05/08/13 1146

## 2016-03-09 ENCOUNTER — Emergency Department (HOSPITAL_COMMUNITY)
Admission: EM | Admit: 2016-03-09 | Discharge: 2016-03-09 | Disposition: A | Discharge status: Home / Self Care | Payer: Self-pay | Attending: Emergency Medicine | Admitting: Emergency Medicine

## 2016-03-09 ENCOUNTER — Encounter (HOSPITAL_COMMUNITY): Payer: Self-pay

## 2016-03-09 DIAGNOSIS — F17210 Nicotine dependence, cigarettes, uncomplicated: Secondary | ICD-10-CM | POA: Insufficient documentation

## 2016-03-09 DIAGNOSIS — T782XXA Anaphylactic shock, unspecified, initial encounter: Secondary | ICD-10-CM | POA: Insufficient documentation

## 2016-03-09 DIAGNOSIS — Z79899 Other long term (current) drug therapy: Secondary | ICD-10-CM | POA: Insufficient documentation

## 2016-03-09 DIAGNOSIS — I10 Essential (primary) hypertension: Secondary | ICD-10-CM | POA: Insufficient documentation

## 2016-03-09 HISTORY — DX: Essential (primary) hypertension: I10

## 2016-03-09 MED ORDER — EPINEPHRINE HCL 1 MG/ML IJ SOLN
INTRAMUSCULAR | Status: AC
  Filled 2016-03-09: qty 1

## 2016-03-09 MED ORDER — SODIUM CHLORIDE 0.9 % IV BOLUS (SEPSIS)
1000.0000 mL | Freq: Once | INTRAVENOUS | Status: AC
  Administered 2016-03-09: 1000 mL via INTRAVENOUS

## 2016-03-09 MED ORDER — EPINEPHRINE HCL 1 MG/ML IJ SOLN
0.3000 mg | Freq: Once | INTRAMUSCULAR | Status: AC
  Administered 2016-03-09: 0.3 mg via SUBCUTANEOUS

## 2016-03-09 MED ORDER — EPINEPHRINE 0.3 MG/0.3ML IJ SOAJ: 0.3000 mg | Freq: Once | INTRAMUSCULAR | 1 refills | Status: AC

## 2016-03-09 MED ORDER — RACEPINEPHRINE HCL 2.25 % IN NEBU
INHALATION_SOLUTION | RESPIRATORY_TRACT | Status: AC
  Administered 2016-03-09: 0.5 mL
  Filled 2016-03-09: qty 0.5

## 2016-03-09 MED ORDER — RACEPINEPHRINE HCL 2.25 % IN NEBU
0.5000 mL | INHALATION_SOLUTION | Freq: Once | RESPIRATORY_TRACT | Status: AC
  Administered 2016-03-09: 0.5 mL via RESPIRATORY_TRACT

## 2016-03-09 MED ORDER — METHYLPREDNISOLONE SODIUM SUCC 125 MG IJ SOLR
125.0000 mg | Freq: Once | INTRAMUSCULAR | Status: AC
  Administered 2016-03-09: 125 mg via INTRAVENOUS
  Filled 2016-03-09: qty 2

## 2016-03-09 MED ORDER — PREDNISONE 20 MG PO TABS: 20.0000 mg | ORAL_TABLET | Freq: Two times a day (BID) | ORAL | 0 refills | Status: DC

## 2016-03-09 MED ORDER — DIPHENHYDRAMINE HCL 25 MG PO CAPS: 25.0000 mg | ORAL_CAPSULE | Freq: Four times a day (QID) | ORAL | 0 refills | Status: DC | PRN

## 2016-03-09 MED ORDER — FAMOTIDINE IN NACL 20-0.9 MG/50ML-% IV SOLN
20.0000 mg | Freq: Once | INTRAVENOUS | Status: AC
  Administered 2016-03-09: 20 mg via INTRAVENOUS
  Filled 2016-03-09: qty 50

## 2016-03-09 MED ORDER — DIPHENHYDRAMINE HCL 50 MG/ML IJ SOLN
25.0000 mg | Freq: Once | INTRAMUSCULAR | Status: AC
  Administered 2016-03-09: 25 mg via INTRAVENOUS
  Filled 2016-03-09: qty 1

## 2016-03-09 NOTE — Discharge Instructions (Signed)
Hives may, in go over the next 48 hours.  Return to ER with any difficult breathing, or mouth swelling, throat symptoms, difficult swallowing.

## 2016-03-09 NOTE — ED Provider Notes (Signed)
[facility] Provider Note   CSN: [id number] Arrival date & time: 03/09/16  1026     History   Chief Complaint Chief Complaint  Patient presents with  . Insect Bite    HPI Eli HoseMichael L Tome is a 55 y.o. male.He was stung by a wasp on his right hand and left wrist. This happened approximately one half our prior to arrival here. His never had significant reaction to beer last stings in the past. Feels like he is having trouble breathing. His itching and hives to his right arm. No syncope.  History of hypertension. No history of cardiovascular, or cerebrovascular disease.  HPI  Past Medical History:  Diagnosis Date  . Hypertension     Patient Active Problem List   Diagnosis Date Noted  . Alcoholism /alcohol abuse (HCC) 07/20/2012  . Bacteremia due to Streptococcus pneumoniae 07/18/2012  . CAP (community acquired pneumonia) 07/16/2012  . Chest pain 07/16/2012  . Hyponatremia 07/16/2012    History reviewed. No pertinent surgical history.     Home Medications    Prior to Admission medications   Medication Sig Start Date End Date Taking? Authorizing Provider  atenolol (TENORMIN) 25 MG tablet Take 25 mg by mouth daily.   Yes Historical Provider, MD  lisinopril (PRINIVIL,ZESTRIL) 20 MG tablet Take 20 mg by mouth daily.   Yes Historical Provider, MD  amoxicillin (AMOXIL) 500 MG capsule Take 1 capsule (500 mg total) by mouth every 8 (eight) hours. Patient not taking: Reported on 03/09/2016 07/22/12   Elease EtienneAnand D Hongalgi, MD  diphenhydrAMINE (BENADRYL) 25 mg capsule Take 1 capsule (25 mg total) by mouth every 6 (six) hours as needed. 03/09/16   Rolland PorterMark Kalia Vahey, MD  EPINEPHrine (ADRENACLICK) 0.3 mg/0.3 mL IJ SOAJ injection Inject 0.3 mLs (0.3 mg total) into the muscle once. 03/09/16 03/09/16  Rolland PorterMark Tc Kapusta, MD  folic acid (FOLVITE) 1 MG tablet Take 1 tablet (1 mg total) by mouth daily. Patient not taking: Reported on 03/09/2016 07/22/12   Elease EtienneAnand D Hongalgi, MD  ibuprofen (ADVIL,MOTRIN) 200 MG  tablet Take 2 tablets (400 mg total) by mouth every 6 (six) hours as needed for pain. Patient not taking: Reported on 03/09/2016 07/22/12   Elease EtienneAnand D Hongalgi, MD  isosorbide mononitrate (IMDUR) 30 MG 24 hr tablet Take 1 tablet (30 mg total) by mouth daily. Patient not taking: Reported on 03/09/2016 05/08/13   Rhetta MuraJai-Gurmukh Samtani, MD  lisinopril (PRINIVIL) 40 MG tablet Take 1 tablet (40 mg total) by mouth daily. Patient not taking: Reported on 03/09/2016 05/08/13   Rhetta MuraJai-Gurmukh Samtani, MD  nicotine (NICODERM CQ - DOSED IN MG/24 HOURS) 14 mg/24hr patch Place 1 patch onto the skin daily. Patient not taking: Reported on 03/09/2016 07/22/12   Elease EtienneAnand D Hongalgi, MD  predniSONE (DELTASONE) 20 MG tablet Take 1 tablet (20 mg total) by mouth 2 (two) times daily with a meal. 03/09/16   Rolland PorterMark Tierre Netto, MD  thiamine 100 MG tablet Take 1 tablet (100 mg total) by mouth daily. Patient not taking: Reported on 03/09/2016 07/22/12   Elease EtienneAnand D Hongalgi, MD    Family History Family History  Problem Relation Age of Onset  . Cancer Mother     Social History Social History  Substance Use Topics  . Smoking status: Current Every Day Smoker    Packs/day: 1.00    Types: Cigarettes  . Smokeless tobacco: Never Used  . Alcohol use Yes     Comment: 40 oz of beer a day.      Allergies  Bee venom and Codeine   Review of Systems Review of Systems  Constitutional: Negative for appetite change, chills, diaphoresis, fatigue and fever.  HENT: Negative for mouth sores, sore throat and trouble swallowing.   Eyes: Negative for visual disturbance.  Respiratory: Positive for chest tightness and shortness of breath. Negative for cough and wheezing.   Cardiovascular: Negative for chest pain.  Gastrointestinal: Negative for abdominal distention, abdominal pain, diarrhea, nausea and vomiting.  Endocrine: Negative for polydipsia, polyphagia and polyuria.  Genitourinary: Negative for dysuria, frequency and hematuria.  Musculoskeletal:  Negative for gait problem.  Skin: Negative for color change, pallor and rash.       Right upper extremity "hives"  Neurological: Negative for dizziness, syncope, light-headedness and headaches.  Hematological: Does not bruise/bleed easily.  Psychiatric/Behavioral: Negative for behavioral problems and confusion.     Physical Exam Updated Vital Signs BP 109/71   Pulse 66   Temp 98.9 F (37.2 C) (Oral)   Resp 19   Ht 5\' 11"  (1.803 m)   Wt 140 lb (63.5 kg)   SpO2 99%   BMI 19.53 kg/m   Physical Exam  Constitutional: He is oriented to person, place, and time. He appears well-developed and well-nourished. He appears distressed.  Hyperventilating and anxious. Awake and alert  HENT:  Head: Normocephalic.  Soft tissue swelling of the upper lip. Tongue appears normal. Tongue is not elevated. Posterior pharynx normal. Uvula midline and without edema.  Eyes: Conjunctivae are normal. Pupils are equal, round, and reactive to light. No scleral icterus.  Neck: Normal range of motion. Neck supple. No thyromegaly present.  Cardiovascular: Normal rate and regular rhythm.  Exam reveals no gallop and no friction rub.   No murmur heard. Pulmonary/Chest: Effort normal and breath sounds normal. No respiratory distress. He has no wheezes. He has no rales.  Tachypneic. Clear lungs.  Abdominal: Soft. Bowel sounds are normal. He exhibits no distension. There is no tenderness. There is no rebound.  Musculoskeletal: Normal range of motion.  Neurological: He is alert and oriented to person, place, and time.  Skin: Skin is warm and dry. No rash noted.  Site of envenomation to the dorsum of her right hand and left wrist. Urticarial rash to his right upper extremity  Psychiatric: He has a normal mood and affect. His behavior is normal.     ED Treatments / Results  Labs (all labs ordered are listed, but only abnormal results are displayed) Labs Reviewed - No data to display  EKG  EKG  Interpretation None       Radiology No results found.  Procedures Procedures (including critical care time)  Medications Ordered in ED Medications  EPINEPHrine (ADRENALIN) 1 MG/ML injection (not administered)  Racepinephrine HCl 2.25 % nebulizer solution 0.5 mL (0.5 mLs Nebulization Given 03/09/16 1046)  diphenhydrAMINE (BENADRYL) injection 25 mg (25 mg Intravenous Given 03/09/16 1052)  famotidine (PEPCID) IVPB 20 mg premix (0 mg Intravenous Stopped 03/09/16 1122)  methylPREDNISolone sodium succinate (SOLU-MEDROL) 125 mg/2 mL injection 125 mg (125 mg Intravenous Given 03/09/16 1052)  sodium chloride 0.9 % bolus 1,000 mL (0 mLs Intravenous Stopped 03/09/16 1131)  Racepinephrine HCl 2.25 % nebulizer solution (0.5 mLs  Given 03/09/16 1053)  EPINEPHrine (ADRENALIN) injection 0.3 mg (0.3 mg Subcutaneous Given 03/09/16 1050)     Initial Impression / Assessment and Plan / ED Course  I have reviewed the triage vital signs and the nursing notes.  Pertinent labs & imaging results that were available during my care of the patient  were reviewed by me and considered in my medical decision making (see chart for details).  Clinical Course    IV placed. Given Benadryl 25, Pepcid 20, slightly mental 25. Started on IV fluids and started on racemic epinephrine neb. Initial BP 93 systolic. Recheck 77. Given subcutaneous epi. His pressures improved. Last check 113 systolic. Breathing easier. Less distressed. Will plan frequent re-evaluations.  Final Clinical Impressions(s) / ED Diagnoses   Final diagnoses:  Anaphylaxis, initial encounter    CRITICAL CARE Performed by: Rolland PorterJAMES, Izzac Rockett JOSEPH   Total critical care time: 30 minutes Start 10:41:  I was asked to leave the care of another patient to assume this patient's care Stop:  11:11. Pt improving after IVF, meds.  Critical care time was exclusive of separately billable procedures and treating other patients.  Critical care was necessary to treat  or prevent imminent or life-threatening deterioration.  Critical care was time spent personally by me on the following activities: development of treatment plan with patient and/or surrogate as well as nursing, discussions with consultants, evaluation of patient's response to treatment, examination of patient, obtaining history from patient or surrogate, ordering and performing treatments and interventions, ordering and review of laboratory studies, ordering and review of radiographic studies, pulse oximetry and re-evaluation of patient's condition.   New Prescriptions New Prescriptions   DIPHENHYDRAMINE (BENADRYL) 25 MG CAPSULE    Take 1 capsule (25 mg total) by mouth every 6 (six) hours as needed.   EPINEPHRINE (ADRENACLICK) 0.3 MG/0.3 ML IJ SOAJ INJECTION    Inject 0.3 mLs (0.3 mg total) into the muscle once.   PREDNISONE (DELTASONE) 20 MG TABLET    Take 1 tablet (20 mg total) by mouth 2 (two) times daily with a meal.    Patient reevaluated multiple times. Resting comfortably. Complete resolution of hives. Normal pharyngeal exam. Normal palmar exam. A symptomatic. Plan is discharge home. Prescription for adrena-click epinephrine pen. Benadryl, and prednisone 48 hours.   Rolland PorterMark Chantavia Bazzle, MD 03/09/16 [phone]

## 2016-03-09 NOTE — ED Notes (Addendum)
Pt. Arrived to the room with hives noted to right torso, stridor, and c/o SOB. EDP at bedside.   Pt. sts he was working today and was stung 4x by wasps. Pt. Denies any hx of bee sting or allergy of bees.

## 2018-12-24 ENCOUNTER — Other Ambulatory Visit: Payer: Self-pay

## 2018-12-24 ENCOUNTER — Encounter (INDEPENDENT_AMBULATORY_CARE_PROVIDER_SITE_OTHER): Payer: Self-pay | Admitting: Ophthalmology

## 2018-12-24 DIAGNOSIS — H353231 Exudative age-related macular degeneration, bilateral, with active choroidal neovascularization: Secondary | ICD-10-CM

## 2018-12-24 DIAGNOSIS — I10 Essential (primary) hypertension: Secondary | ICD-10-CM

## 2018-12-24 DIAGNOSIS — H2513 Age-related nuclear cataract, bilateral: Secondary | ICD-10-CM

## 2018-12-24 DIAGNOSIS — H35033 Hypertensive retinopathy, bilateral: Secondary | ICD-10-CM

## 2018-12-24 DIAGNOSIS — H43813 Vitreous degeneration, bilateral: Secondary | ICD-10-CM

## 2019-01-25 ENCOUNTER — Encounter (INDEPENDENT_AMBULATORY_CARE_PROVIDER_SITE_OTHER): Payer: Self-pay | Admitting: Ophthalmology

## 2019-01-25 ENCOUNTER — Other Ambulatory Visit: Payer: Self-pay

## 2019-01-25 DIAGNOSIS — H2513 Age-related nuclear cataract, bilateral: Secondary | ICD-10-CM

## 2019-01-25 DIAGNOSIS — H353231 Exudative age-related macular degeneration, bilateral, with active choroidal neovascularization: Secondary | ICD-10-CM

## 2019-01-25 DIAGNOSIS — I10 Essential (primary) hypertension: Secondary | ICD-10-CM

## 2019-01-25 DIAGNOSIS — H43813 Vitreous degeneration, bilateral: Secondary | ICD-10-CM

## 2019-01-25 DIAGNOSIS — H35033 Hypertensive retinopathy, bilateral: Secondary | ICD-10-CM

## 2019-02-22 ENCOUNTER — Other Ambulatory Visit: Payer: Self-pay

## 2019-02-22 ENCOUNTER — Encounter (INDEPENDENT_AMBULATORY_CARE_PROVIDER_SITE_OTHER): Payer: Self-pay | Admitting: Ophthalmology

## 2019-02-22 DIAGNOSIS — H35033 Hypertensive retinopathy, bilateral: Secondary | ICD-10-CM

## 2019-02-22 DIAGNOSIS — H43813 Vitreous degeneration, bilateral: Secondary | ICD-10-CM

## 2019-02-22 DIAGNOSIS — H2513 Age-related nuclear cataract, bilateral: Secondary | ICD-10-CM

## 2019-02-22 DIAGNOSIS — H353231 Exudative age-related macular degeneration, bilateral, with active choroidal neovascularization: Secondary | ICD-10-CM

## 2019-02-22 DIAGNOSIS — I10 Essential (primary) hypertension: Secondary | ICD-10-CM

## 2019-03-21 ENCOUNTER — Other Ambulatory Visit: Payer: Self-pay

## 2019-03-21 ENCOUNTER — Encounter (INDEPENDENT_AMBULATORY_CARE_PROVIDER_SITE_OTHER): Payer: Self-pay | Admitting: Ophthalmology

## 2019-03-21 DIAGNOSIS — H43813 Vitreous degeneration, bilateral: Secondary | ICD-10-CM

## 2019-03-21 DIAGNOSIS — H35033 Hypertensive retinopathy, bilateral: Secondary | ICD-10-CM

## 2019-03-21 DIAGNOSIS — I10 Essential (primary) hypertension: Secondary | ICD-10-CM

## 2019-03-21 DIAGNOSIS — H353231 Exudative age-related macular degeneration, bilateral, with active choroidal neovascularization: Secondary | ICD-10-CM

## 2019-03-21 DIAGNOSIS — H2513 Age-related nuclear cataract, bilateral: Secondary | ICD-10-CM

## 2019-05-03 ENCOUNTER — Encounter (INDEPENDENT_AMBULATORY_CARE_PROVIDER_SITE_OTHER): Payer: Self-pay | Admitting: Ophthalmology

## 2020-02-28 ENCOUNTER — Other Ambulatory Visit: Payer: Self-pay

## 2020-02-28 ENCOUNTER — Ambulatory Visit (HOSPITAL_COMMUNITY)
Admission: EM | Admit: 2020-02-28 | Discharge: 2020-02-28 | Disposition: A | Discharge status: Home / Self Care | Payer: Self-pay | Attending: Physician Assistant | Admitting: Physician Assistant

## 2020-02-28 ENCOUNTER — Encounter (HOSPITAL_COMMUNITY): Payer: Self-pay

## 2020-02-28 DIAGNOSIS — R21 Rash and other nonspecific skin eruption: Secondary | ICD-10-CM

## 2020-02-28 MED ORDER — TRIAMCINOLONE ACETONIDE 0.1 % EX CREA: 1.0000 "application " | TOPICAL_CREAM | Freq: Two times a day (BID) | CUTANEOUS | 0 refills | Status: DC

## 2020-02-28 NOTE — ED Triage Notes (Signed)
Pt presents with recurrent rash on both arms from unknown source.

## 2020-02-28 NOTE — Discharge Instructions (Signed)
Try the cream Continue calamine  Follow up with your primary care

## 2020-02-28 NOTE — ED Provider Notes (Addendum)
[facility]    CSN: [id number] Arrival date & time: 02/28/20  1450      History   Chief Complaint Chief Complaint  Patient presents with  . Rash    HPI Christopher Logan is a 59 y.o. male.   Patient presents for evaluation of rash on the right inner arm.  He reports this is recurrent rash.  The rash is itchy.  He reports he had a similar rash around 2 months ago that went away with use of calamine.  He reports this rash came back around 2 weeks ago.  He has been applying calamine lotion.  He states that the rashes look like welts as well as small vesicles.  It is in the exact same spot both times.  He reports the rash has been improving with calamine however he is concerned as it is returned.  He did see his primary care after the first episode however it was not there any longer.  Denies rash elsewhere.  He is unsure if he was bitten.  He reports it did start after crawling under house initially.  Reports he works with [employer].  Reports it was never painful.     Past Medical History:  Diagnosis Date  . Hypertension     Patient Active Problem List   Diagnosis Date Noted  . Alcoholism /alcohol abuse (HCC) 07/20/2012  . Bacteremia due to Streptococcus pneumoniae 07/18/2012  . CAP (community acquired pneumonia) 07/16/2012  . Chest pain 07/16/2012  . Hyponatremia 07/16/2012    History reviewed. No pertinent surgical history.     Home Medications    Prior to Admission medications   Medication Sig Start Date End Date Taking? Authorizing Provider  amoxicillin (AMOXIL) 500 MG capsule Take 1 capsule (500 mg total) by mouth every 8 (eight) hours. Patient not taking: Reported on 03/09/2016 07/22/12   Elease Etienne, MD  atenolol (TENORMIN) 25 MG tablet Take 25 mg by mouth daily.    [provider]  diphenhydrAMINE (BENADRYL) 25 mg capsule Take 1 capsule (25 mg total) by mouth every 6 (six) hours as needed. 03/09/16   Rolland Porter, MD  folic acid (FOLVITE)  1 MG tablet Take 1 tablet (1 mg total) by mouth daily. Patient not taking: Reported on 03/09/2016 07/22/12   Elease Etienne, MD  ibuprofen (ADVIL,MOTRIN) 200 MG tablet Take 2 tablets (400 mg total) by mouth every 6 (six) hours as needed for pain. Patient not taking: Reported on 03/09/2016 07/22/12   Elease Etienne, MD  isosorbide mononitrate (IMDUR) 30 MG 24 hr tablet Take 1 tablet (30 mg total) by mouth daily. Patient not taking: Reported on 03/09/2016 05/08/13   Rhetta Mura, MD  lisinopril (PRINIVIL) 40 MG tablet Take 1 tablet (40 mg total) by mouth daily. Patient not taking: Reported on 03/09/2016 05/08/13   Rhetta Mura, MD  lisinopril (PRINIVIL,ZESTRIL) 20 MG tablet Take 20 mg by mouth daily.    [provider]  nicotine (NICODERM CQ - DOSED IN MG/24 HOURS) 14 mg/24hr patch Place 1 patch onto the skin daily. Patient not taking: Reported on 03/09/2016 07/22/12   Elease Etienne, MD  predniSONE (DELTASONE) 20 MG tablet Take 1 tablet (20 mg total) by mouth 2 (two) times daily with a meal. 03/09/16   Rolland Porter, MD  thiamine 100 MG tablet Take 1 tablet (100 mg total) by mouth daily. Patient not taking: Reported on 03/09/2016 07/22/12   Elease Etienne, MD  triamcinolone cream (KENALOG) 0.1 %  Apply 1 application topically 2 (two) times daily. 02/28/20   Starlett Pehrson, Veryl Speak, PA-C    Family History Family History  Problem Relation Age of Onset  . Cancer Mother     Social History Social History   Tobacco Use  . Smoking status: Current Every Day Smoker    Packs/day: 1.00    Types: Cigarettes  . Smokeless tobacco: Never Used  Substance Use Topics  . Alcohol use: Yes    Comment: 40 oz of beer a day.   . Drug use: Yes    Types: Marijuana     Allergies   Bee venom and Codeine   Review of Systems Review of Systems   Physical Exam Triage Vital Signs ED Triage Vitals  Enc Vitals Group     BP 02/28/20 1533 122/79     Pulse Rate 02/28/20 1533 73     Resp  02/28/20 1533 18     Temp 02/28/20 1533 98.3 F (36.8 C)     Temp Source 02/28/20 1533 Oral     SpO2 02/28/20 1533 98 %     Weight --      Height --      Head Circumference --      Peak Flow --      Pain Score 02/28/20 1534 1     Pain Loc --      Pain Edu? --      Excl. in GC? --    No data found.  Updated Vital Signs BP 122/79 (BP Location: Right Arm)   Pulse 73   Temp 98.3 F (36.8 C) (Oral)   Resp 18   SpO2 98%   Visual Acuity Right Eye Distance:   Left Eye Distance:   Bilateral Distance:    Right Eye Near:   Left Eye Near:    Bilateral Near:     Physical Exam Vitals and nursing note reviewed.  Constitutional:      Appearance: Normal appearance.  Skin:    General: Skin is warm and dry.     Comments: Erythematous papular rash to right forearm and inner elbow. 1-2 papules more proximal on upper arm and 1-2 on distal rist. No vesicles. No pustules  Neurological:     Mental Status: He is alert.      UC Treatments / Results  Labs (all labs ordered are listed, but only abnormal results are displayed) Labs Reviewed - No data to display  EKG   Radiology No results found.  Procedures Procedures (including critical care time)  Medications Ordered in UC Medications - No data to display  Initial Impression / Assessment and Plan / UC Course  I have reviewed the triage vital signs and the nursing notes.  Pertinent labs & imaging results that were available during my care of the patient were reviewed by me and considered in my medical decision making (see chart for details).     #Rash Patient's 59 year old presenting with an itchy rash to the right inner forearm.  Unclear etiology, suspicious for insect bites.  Do not feel there is any emergent concerns.  Given no pain and only itch, doubt shingles.  Consideration for contact as well.  We will try triamcinolone cream and have him follow-up with his primary care.  Patient verbalized understanding. Final  Clinical Impressions(s) / UC Diagnoses   Final diagnoses:  Rash and nonspecific skin eruption     Discharge Instructions     Try the cream Continue calamine  Follow up with your  primary care      ED Prescriptions    Medication Sig Dispense Auth. Provider   triamcinolone cream (KENALOG) 0.1 % Apply 1 application topically 2 (two) times daily. 30 g Elianis Fischbach, Veryl Speak, PA-C     PDMP not reviewed this encounter.   Hermelinda Medicus, PA-C 02/28/20 1609    Nobuo Nunziata, Veryl Speak, PA-C 02/28/20 1609

## 2020-03-29 ENCOUNTER — Encounter (HOSPITAL_COMMUNITY): Payer: Self-pay | Admitting: Emergency Medicine

## 2020-03-29 ENCOUNTER — Other Ambulatory Visit: Payer: Self-pay

## 2020-03-29 ENCOUNTER — Emergency Department (HOSPITAL_COMMUNITY)
Admission: EM | Admit: 2020-03-29 | Discharge: 2020-03-30 | Disposition: A | Discharge status: Home / Self Care | Payer: Self-pay | Attending: Emergency Medicine | Admitting: Emergency Medicine

## 2020-03-29 DIAGNOSIS — Z79899 Other long term (current) drug therapy: Secondary | ICD-10-CM | POA: Insufficient documentation

## 2020-03-29 DIAGNOSIS — F17210 Nicotine dependence, cigarettes, uncomplicated: Secondary | ICD-10-CM | POA: Insufficient documentation

## 2020-03-29 DIAGNOSIS — L03115 Cellulitis of right lower limb: Secondary | ICD-10-CM | POA: Insufficient documentation

## 2020-03-29 DIAGNOSIS — I10 Essential (primary) hypertension: Secondary | ICD-10-CM | POA: Insufficient documentation

## 2020-03-29 DIAGNOSIS — F1590 Other stimulant use, unspecified, uncomplicated: Secondary | ICD-10-CM | POA: Insufficient documentation

## 2020-03-29 LAB — CBC WITH DIFFERENTIAL/PLATELET
Abs Immature Granulocytes: 0.1 10*3/uL — ABNORMAL HIGH (ref 0.00–0.07)
Basophils Absolute: 0.1 10*3/uL (ref 0.0–0.1)
Basophils Relative: 1 %
Eosinophils Absolute: 0.2 10*3/uL (ref 0.0–0.5)
Eosinophils Relative: 2 %
HCT: 37.9 % — ABNORMAL LOW (ref 39.0–52.0)
Hemoglobin: 12.7 g/dL — ABNORMAL LOW (ref 13.0–17.0)
Immature Granulocytes: 1 %
Lymphocytes Relative: 12 %
Lymphs Abs: 1.5 10*3/uL (ref 0.7–4.0)
MCH: 34 pg (ref 26.0–34.0)
MCHC: 33.5 g/dL (ref 30.0–36.0)
MCV: 101.3 fL — ABNORMAL HIGH (ref 80.0–100.0)
Monocytes Absolute: 1.8 10*3/uL — ABNORMAL HIGH (ref 0.1–1.0)
Monocytes Relative: 14 %
Neutro Abs: 8.6 10*3/uL — ABNORMAL HIGH (ref 1.7–7.7)
Neutrophils Relative %: 70 %
Platelets: 165 10*3/uL (ref 150–400)
RBC: 3.74 MIL/uL — ABNORMAL LOW (ref 4.22–5.81)
RDW: 13.5 % (ref 11.5–15.5)
WBC: 12.2 10*3/uL — ABNORMAL HIGH (ref 4.0–10.5)
nRBC: 0 % (ref 0.0–0.2)

## 2020-03-29 LAB — COMPREHENSIVE METABOLIC PANEL
ALT: 29 U/L (ref 0–44)
AST: 44 U/L — ABNORMAL HIGH (ref 15–41)
Albumin: 3 g/dL — ABNORMAL LOW (ref 3.5–5.0)
Alkaline Phosphatase: 43 U/L (ref 38–126)
Anion gap: 11 (ref 5–15)
BUN: 17 mg/dL (ref 6–20)
CO2: 21 mmol/L — ABNORMAL LOW (ref 22–32)
Calcium: 9 mg/dL (ref 8.9–10.3)
Chloride: 100 mmol/L (ref 98–111)
Creatinine, Ser: 0.84 mg/dL (ref 0.61–1.24)
GFR calc Af Amer: >60 mL/min (ref >60)
GFR calc non Af Amer: >60 mL/min (ref >60)
Glucose, Bld: 100 mg/dL — ABNORMAL HIGH (ref 70–99)
Potassium: 4.4 mmol/L (ref 3.5–5.1)
Sodium: 132 mmol/L — ABNORMAL LOW (ref 135–145)
Total Bilirubin: 0.8 mg/dL (ref 0.3–1.2)
Total Protein: 7.6 g/dL (ref 6.5–8.1)

## 2020-03-29 LAB — LACTIC ACID, PLASMA: Lactic Acid, Venous: 1.7 mmol/L (ref 0.5–1.9)

## 2020-03-29 NOTE — ED Triage Notes (Signed)
C/o pain, redness, and swelling to R lateral leg/knee x 1 week.  Denies fever/chills.

## 2020-03-30 MED ORDER — ACETAMINOPHEN 500 MG PO TABS
1000.0000 mg | ORAL_TABLET | Freq: Once | ORAL | Status: AC
Start: 2020-03-30 — End: 2020-03-30
  Administered 2020-03-30: 1000 mg via ORAL
  Filled 2020-03-30: qty 2

## 2020-03-30 MED ORDER — CEPHALEXIN 500 MG PO CAPS: 500.0000 mg | ORAL_CAPSULE | Freq: Four times a day (QID) | ORAL | 0 refills | Status: AC

## 2020-03-30 MED ORDER — CEPHALEXIN 250 MG PO CAPS
500.0000 mg | ORAL_CAPSULE | Freq: Once | ORAL | Status: AC
  Administered 2020-03-30: 500 mg via ORAL
  Filled 2020-03-30: qty 2

## 2020-03-30 NOTE — ED Provider Notes (Signed)
[facility] EMERGENCY DEPARTMENT Provider Note  CSN: [id number] Arrival date & time: 03/29/20 1347  Chief Complaint(s) Leg Pain  HPI Christopher Logan is a 59 y.o. male  The history is provided by the patient.  Leg Pain Location:  Leg Time since incident:  4 days Leg location:  R leg Pain details:    Quality:  Aching   Severity:  Moderate   Onset quality:  Gradual   Timing:  Constant   Progression:  Worsening Dislocation: no   Relieved by:  Nothing Exacerbated by: palpation. Associated symptoms: swelling   Associated symptoms: no neck pain and no numbness    Patient reports having a small cut on the anterior part of his knee this past week while crawling around during work.  Past Medical History Past Medical History:  Diagnosis Date  . Hypertension    Patient Active Problem List   Diagnosis Date Noted  . Alcoholism /alcohol abuse (HCC) 07/20/2012  . Bacteremia due to Streptococcus pneumoniae 07/18/2012  . CAP (community acquired pneumonia) 07/16/2012  . Chest pain 07/16/2012  . Hyponatremia 07/16/2012   Home Medication(s) Prior to Admission medications   Medication Sig Start Date End Date Taking? Authorizing Provider  amoxicillin (AMOXIL) 500 MG capsule Take 1 capsule (500 mg total) by mouth every 8 (eight) hours. Patient not taking: Reported on 03/09/2016 07/22/12   Elease Etienne, MD  atenolol (TENORMIN) 25 MG tablet Take 25 mg by mouth daily.    [provider]  cephALEXin (KEFLEX) 500 MG capsule Take 1 capsule (500 mg total) by mouth 4 (four) times daily for 7 days. 03/30/20 04/06/20  Nira Conn, MD  diphenhydrAMINE (BENADRYL) 25 mg capsule Take 1 capsule (25 mg total) by mouth every 6 (six) hours as needed. 03/09/16   Rolland Porter, MD  folic acid (FOLVITE) 1 MG tablet Take 1 tablet (1 mg total) by mouth daily. Patient not taking: Reported on 03/09/2016 07/22/12   Elease Etienne, MD  ibuprofen (ADVIL,MOTRIN) 200 MG tablet Take 2  tablets (400 mg total) by mouth every 6 (six) hours as needed for pain. Patient not taking: Reported on 03/09/2016 07/22/12   Elease Etienne, MD  isosorbide mononitrate (IMDUR) 30 MG 24 hr tablet Take 1 tablet (30 mg total) by mouth daily. Patient not taking: Reported on 03/09/2016 05/08/13   Rhetta Mura, MD  lisinopril (PRINIVIL) 40 MG tablet Take 1 tablet (40 mg total) by mouth daily. Patient not taking: Reported on 03/09/2016 05/08/13   Rhetta Mura, MD  lisinopril (PRINIVIL,ZESTRIL) 20 MG tablet Take 20 mg by mouth daily.    [provider]  nicotine (NICODERM CQ - DOSED IN MG/24 HOURS) 14 mg/24hr patch Place 1 patch onto the skin daily. Patient not taking: Reported on 03/09/2016 07/22/12   Elease Etienne, MD  predniSONE (DELTASONE) 20 MG tablet Take 1 tablet (20 mg total) by mouth 2 (two) times daily with a meal. 03/09/16   Rolland Porter, MD  thiamine 100 MG tablet Take 1 tablet (100 mg total) by mouth daily. Patient not taking: Reported on 03/09/2016 07/22/12   Elease Etienne, MD  triamcinolone cream (KENALOG) 0.1 % Apply 1 application topically 2 (two) times daily. 02/28/20   Darr, Veryl Speak, PA-C  Past Surgical History History reviewed. No pertinent surgical history. Family History Family History  Problem Relation Age of Onset  . Cancer Mother     Social History Social History   Tobacco Use  . Smoking status: Current Every Day Smoker    Packs/day: 1.00    Types: Cigarettes  . Smokeless tobacco: Never Used  Substance Use Topics  . Alcohol use: Yes    Comment: 40 oz of beer a day.   . Drug use: Yes    Types: Marijuana   Allergies Bee venom and Codeine  Review of Systems Review of Systems  Musculoskeletal: Negative for neck pain.   All other systems are reviewed and are negative for acute change except as noted in the  HPI  Physical Exam Vital Signs  I have reviewed the triage vital signs BP 135/75 (BP Location: Left Arm)   Pulse 98   Temp 98.9 F (37.2 C) (Oral)   Resp 16   SpO2 98%   Physical Exam Vitals reviewed.  Constitutional:      General: He is not in acute distress.    Appearance: He is well-developed. He is not diaphoretic.  HENT:     Head: Normocephalic and atraumatic.     Jaw: No trismus.     Right Ear: External ear normal.     Left Ear: External ear normal.     Nose: Nose normal.  Eyes:     General: No scleral icterus.    Conjunctiva/sclera: Conjunctivae normal.  Neck:     Trachea: Phonation normal.  Cardiovascular:     Rate and Rhythm: Normal rate and regular rhythm.  Pulmonary:     Effort: Pulmonary effort is normal. No respiratory distress.     Breath sounds: No stridor.  Abdominal:     General: There is no distension.  Musculoskeletal:        General: Normal range of motion.     Cervical back: Normal range of motion.     Right knee: Swelling and erythema present. No lacerations. Normal range of motion. Tenderness present.     Right lower leg: Swelling present.     Right foot: Normal capillary refill. Normal pulse.       Legs:  Neurological:     Mental Status: He is alert and oriented to person, place, and time.  Psychiatric:        Behavior: Behavior normal.       ED Results and Treatments Labs (all labs ordered are listed, but only abnormal results are displayed) Labs Reviewed  COMPREHENSIVE METABOLIC PANEL - Abnormal; Notable for the following components:      Result Value   Sodium 132 (*)    CO2 21 (*)    Glucose, Bld 100 (*)    Albumin 3.0 (*)    AST 44 (*)    All other components within normal limits  CBC WITH DIFFERENTIAL/PLATELET - Abnormal; Notable for the following components:   WBC 12.2 (*)    RBC 3.74 (*)    Hemoglobin 12.7 (*)    HCT 37.9 (*)    MCV 101.3 (*)    Neutro Abs 8.6 (*)    Monocytes Absolute 1.8 (*)    Abs Immature  Granulocytes 0.10 (*)    All other components within normal limits  LACTIC ACID, PLASMA  LACTIC ACID, PLASMA  EKG  EKG Interpretation  Date/Time:    Ventricular Rate:    PR Interval:    QRS Duration:   QT Interval:    QTC Calculation:   R Axis:     Text Interpretation:        Radiology No results found.  Pertinent labs & imaging results that were available during my care of the patient were reviewed by me and considered in my medical decision making (see chart for details).  Medications Ordered in ED Medications  acetaminophen (TYLENOL) tablet 1,000 mg (has no administration in time range)  cephALEXin (KEFLEX) capsule 500 mg (has no administration in time range)                                                                                                                                    Procedures Procedures  (including critical care time)  Medical Decision Making / ED Course I have reviewed the nursing notes for this encounter and the patient's prior records (if available in EHR or on provided paperwork).   BLESSING OZGA was evaluated in Emergency Department on 03/30/2020 for the symptoms described in the history of present illness. He was evaluated in the context of the global COVID-19 pandemic, which necessitated consideration that the patient might be at risk for infection with the SARS-CoV-2 virus that causes COVID-19. Institutional protocols and algorithms that pertain to the evaluation of patients at risk for COVID-19 are in a state of rapid change based on information released by regulatory bodies including the CDC and federal and state organizations. These policies and algorithms were followed during the patient's care in the ED.  Consistent with cellulitis. Likely seeding from wound. Joint with full ROM and nontender. Doubt septic  arthritis. Doubt DVT. Will treat with course of keflex. Close PCP follow up recommended.      Final Clinical Impression(s) / ED Diagnoses Final diagnoses:  Cellulitis of right leg   The patient appears reasonably screened and/or stabilized for discharge and I doubt any other medical condition or other [facility] requiring further screening, evaluation, or treatment in the ED at this time prior to discharge. Safe for discharge with strict return precautions.  Disposition: Discharge  Condition: Good  I have discussed the results, Dx and Tx plan with the patient/family who expressed understanding and agree(s) with the plan. Discharge instructions discussed at length. The patient/family was given strict return precautions who verbalized understanding of the instructions. No further questions at time of discharge.    ED Discharge Orders         Ordered    cephALEXin (KEFLEX) 500 MG capsule  4 times daily        03/30/20 0309            Follow Up: [name], MD [address] [place] [address] [phone]  Schedule an appointment as soon as possible for a visit  in 5-7  days      This chart was dictated using voice recognition software.  Despite best efforts to proofread,  errors can occur which can change the documentation meaning.   Nira Connardama, Phelan Schadt Eduardo, MD 03/30/20 [phone]

## 2021-02-04 ENCOUNTER — Other Ambulatory Visit: Payer: Self-pay | Admitting: Family Medicine

## 2021-02-04 ENCOUNTER — Ambulatory Visit
Admission: RE | Admit: 2021-02-04 | Discharge: 2021-02-04 | Disposition: A | Discharge status: Home / Self Care | Payer: No Typology Code available for payment source | Source: Ambulatory Visit | Attending: Family Medicine | Admitting: Family Medicine

## 2021-02-04 DIAGNOSIS — F17200 Nicotine dependence, unspecified, uncomplicated: Secondary | ICD-10-CM

## 2021-02-04 DIAGNOSIS — R062 Wheezing: Secondary | ICD-10-CM

## 2021-02-08 ENCOUNTER — Other Ambulatory Visit: Payer: Self-pay | Admitting: Family Medicine

## 2021-02-08 DIAGNOSIS — R7989 Other specified abnormal findings of blood chemistry: Secondary | ICD-10-CM

## 2021-04-20 ENCOUNTER — Other Ambulatory Visit: Payer: Self-pay | Admitting: Nurse Practitioner

## 2021-04-20 DIAGNOSIS — R768 Other specified abnormal immunological findings in serum: Secondary | ICD-10-CM

## 2021-04-20 DIAGNOSIS — R748 Abnormal levels of other serum enzymes: Secondary | ICD-10-CM

## 2021-04-29 ENCOUNTER — Ambulatory Visit
Admission: RE | Admit: 2021-04-29 | Discharge: 2021-04-29 | Disposition: A | Discharge status: Home / Self Care | Payer: Self-pay | Source: Ambulatory Visit | Attending: Nurse Practitioner | Admitting: Nurse Practitioner

## 2021-04-29 DIAGNOSIS — R748 Abnormal levels of other serum enzymes: Secondary | ICD-10-CM

## 2021-04-29 DIAGNOSIS — R768 Other specified abnormal immunological findings in serum: Secondary | ICD-10-CM

## 2021-11-24 ENCOUNTER — Other Ambulatory Visit: Payer: Self-pay | Admitting: Nurse Practitioner

## 2021-11-24 DIAGNOSIS — B182 Chronic viral hepatitis C: Secondary | ICD-10-CM

## 2021-12-02 ENCOUNTER — Other Ambulatory Visit (HOSPITAL_COMMUNITY): Payer: Self-pay | Admitting: Nurse Practitioner

## 2021-12-02 DIAGNOSIS — R0902 Hypoxemia: Secondary | ICD-10-CM

## 2021-12-03 ENCOUNTER — Other Ambulatory Visit: Payer: Self-pay | Admitting: Family Medicine

## 2021-12-03 DIAGNOSIS — R0989 Other specified symptoms and signs involving the circulatory and respiratory systems: Secondary | ICD-10-CM

## 2021-12-07 ENCOUNTER — Ambulatory Visit (INDEPENDENT_AMBULATORY_CARE_PROVIDER_SITE_OTHER): Payer: 59

## 2021-12-07 DIAGNOSIS — R0989 Other specified symptoms and signs involving the circulatory and respiratory systems: Secondary | ICD-10-CM

## 2021-12-08 ENCOUNTER — Other Ambulatory Visit: Payer: Self-pay | Admitting: Family Medicine

## 2021-12-08 ENCOUNTER — Ambulatory Visit: Payer: 59

## 2021-12-08 DIAGNOSIS — R0609 Other forms of dyspnea: Secondary | ICD-10-CM

## 2021-12-20 ENCOUNTER — Other Ambulatory Visit (HOSPITAL_COMMUNITY): Payer: Self-pay | Admitting: Nurse Practitioner

## 2021-12-20 ENCOUNTER — Ambulatory Visit (HOSPITAL_COMMUNITY)
Admission: RE | Admit: 2021-12-20 | Discharge: 2021-12-20 | Disposition: A | Discharge status: Home / Self Care | Payer: 59 | Source: Ambulatory Visit | Attending: Nurse Practitioner | Admitting: Nurse Practitioner

## 2021-12-20 DIAGNOSIS — I10 Essential (primary) hypertension: Secondary | ICD-10-CM | POA: Diagnosis not present

## 2021-12-20 DIAGNOSIS — K7469 Other cirrhosis of liver: Secondary | ICD-10-CM | POA: Insufficient documentation

## 2021-12-20 DIAGNOSIS — R0902 Hypoxemia: Secondary | ICD-10-CM

## 2021-12-20 DIAGNOSIS — R079 Chest pain, unspecified: Secondary | ICD-10-CM | POA: Diagnosis not present

## 2021-12-20 LAB — ECHOCARDIOGRAM COMPLETE BUBBLE STUDY
Area-P 1/2: 3.74 cm2
Calc EF: 54.8 %
MV M vel: 5.57 m/s
MV Peak grad: 124.1 mmHg
Radius: 0.3 cm
S' Lateral: 3.5 cm
Single Plane A2C EF: 58.7 %
Single Plane A4C EF: 51.3 %

## 2021-12-20 NOTE — Progress Notes (Signed)
  Echocardiogram 2D Echocardiogram has been performed.  Christopher Logan 12/20/2021, 4:23 PM

## 2021-12-22 ENCOUNTER — Encounter: Payer: Self-pay | Admitting: Pulmonary Disease

## 2021-12-22 ENCOUNTER — Ambulatory Visit (INDEPENDENT_AMBULATORY_CARE_PROVIDER_SITE_OTHER): Payer: 59 | Admitting: Pulmonary Disease

## 2021-12-22 ENCOUNTER — Telehealth: Payer: Self-pay | Admitting: Pulmonary Disease

## 2021-12-22 VITALS — BP 150/82 | HR 91 | Ht 71.0 in | Wt 140.0 lb

## 2021-12-22 DIAGNOSIS — R0602 Shortness of breath: Secondary | ICD-10-CM | POA: Diagnosis not present

## 2021-12-22 DIAGNOSIS — F17210 Nicotine dependence, cigarettes, uncomplicated: Secondary | ICD-10-CM

## 2021-12-22 DIAGNOSIS — R200 Anesthesia of skin: Secondary | ICD-10-CM | POA: Diagnosis not present

## 2021-12-22 DIAGNOSIS — M5489 Other dorsalgia: Secondary | ICD-10-CM | POA: Diagnosis not present

## 2021-12-22 NOTE — Patient Instructions (Signed)
I am glad you have decided to quit smoking.   Try Breztri inhaler 2 puffs twice daily  - rinse mouth out after each visit  Let us know if you notice improvement in your breathing and we will send in prescription for this inhaler.  We will refer you to lung cancer screening program.  We will refer you to Neurosurgery for evaluation of your back pain and leg numbness.   We will check ABI ultrasound to evaluate for blood flow in your legs.   Follow up in 2 months with pulmonary function tests.

## 2021-12-22 NOTE — Telephone Encounter (Signed)
I just spoke to [place] and got test scheduled.  Nothing further needed on this message.

## 2021-12-22 NOTE — Progress Notes (Signed)
Synopsis: Referred in June 2023 for Shortness of breath by [name], MD  Subjective:   PATIENT ID: Christopher Logan GENDER: male DOB: 1961-06-07, MRN: [id number]   HPI  Chief Complaint  Patient presents with   Consult    Referred by PCP for increased DOE over the past 2 years. Stopped smoking this past weekend.    Christopher Logan is a 61 year old male, daily smoker with history of hypertension who is referred to pulmonary clinic for shortness of breath.   He reports shortness of breath for about 1 year. Feels like his arms and legs go numb when he has dyspnea. He has occasional cough and wheezing. He has occasional sputum production. His weight has been stable. He reports swelling in his hands and feet.  He reports trouble swallowing which has improved since since starting omeprazole for treatment of a stomach ulcer. He is seeing Dr. Bosie Clos in GI.    He is working in Network engineer for 19 years. No asbestos exposure. He has recently quit smoking a week ago. He has a 50+ pack year smoking history. He reports second hand smoke in child hood. Reports he had CT Lung cancer screening scan this year.   His oldest history has COPD and CVA.   Past Medical History:  Diagnosis Date   Hypertension      Family History  Problem Relation Age of Onset   Cancer Mother      Social History   Socioeconomic History   Marital status: Single    Spouse name: Not on file   Number of children: Not on file   Years of education: Not on file   Highest education level: Not on file  Occupational History   Not on file  Tobacco Use   Smoking status: Former    Packs/day: 1.50    Types: Cigarettes    Start date: 09/09/1976    Quit date: 12/18/2021    Years since quitting: 0.0    Passive exposure: Never   Smokeless tobacco: Never  Substance and Sexual Activity   Alcohol use: Yes    Comment: 40 oz of beer a day.    Drug use: Yes    Types: Marijuana   Sexual activity: Not Currently  Other  Topics Concern   Not on file  Social History Narrative   Not on file   Social Determinants of Health   Financial Resource Strain: Not on file  Food Insecurity: Not on file  Transportation Needs: Not on file  Physical Activity: Not on file  Stress: Not on file  Social Connections: Not on file  Intimate Partner Violence: Not on file     Allergies  Allergen Reactions   Bee Venom Anaphylaxis   Codeine Nausea Only     Outpatient Medications Prior to Visit  Medication Sig Dispense Refill   lisinopril (ZESTRIL) 40 MG tablet Take 40 mg by mouth daily.     omeprazole (PRILOSEC) 40 MG capsule Take 40 mg by mouth daily.     amoxicillin (AMOXIL) 500 MG capsule Take 1 capsule (500 mg total) by mouth every 8 (eight) hours. (Patient not taking: Reported on 03/09/2016) 21 capsule 0   atenolol (TENORMIN) 25 MG tablet Take 25 mg by mouth daily.     diphenhydrAMINE (BENADRYL) 25 mg capsule Take 1 capsule (25 mg total) by mouth every 6 (six) hours as needed. 8 capsule 0   folic acid (FOLVITE) 1 MG tablet Take 1 tablet (1 mg total) by mouth  daily. (Patient not taking: Reported on 03/09/2016) 30 tablet 0   ibuprofen (ADVIL,MOTRIN) 200 MG tablet Take 2 tablets (400 mg total) by mouth every 6 (six) hours as needed for pain. (Patient not taking: Reported on 03/09/2016)     isosorbide mononitrate (IMDUR) 30 MG 24 hr tablet Take 1 tablet (30 mg total) by mouth daily. (Patient not taking: Reported on 03/09/2016) 30 tablet 0   lisinopril (PRINIVIL) 40 MG tablet Take 1 tablet (40 mg total) by mouth daily. (Patient not taking: Reported on 03/09/2016) 30 tablet 0   lisinopril (PRINIVIL,ZESTRIL) 20 MG tablet Take 20 mg by mouth daily.     nicotine (NICODERM CQ - DOSED IN MG/24 HOURS) 14 mg/24hr patch Place 1 patch onto the skin daily. (Patient not taking: Reported on 03/09/2016) 28 patch 0   predniSONE (DELTASONE) 20 MG tablet Take 1 tablet (20 mg total) by mouth 2 (two) times daily with a meal. 4 tablet 0   thiamine  100 MG tablet Take 1 tablet (100 mg total) by mouth daily. (Patient not taking: Reported on 03/09/2016) 30 tablet 0   triamcinolone cream (KENALOG) 0.1 % Apply 1 application topically 2 (two) times daily. 30 g 0   No facility-administered medications prior to visit.    Review of Systems  Constitutional:  Negative for chills, fever, malaise/fatigue and weight loss.  HENT:  Negative for congestion, sinus pain and sore throat.   Eyes: Negative.   Respiratory:  Positive for shortness of breath. Negative for cough, hemoptysis, sputum production and wheezing.   Cardiovascular:  Negative for chest pain, palpitations, orthopnea, claudication and leg swelling.  Gastrointestinal:  Negative for abdominal pain, heartburn, nausea and vomiting.  Genitourinary: Negative.   Musculoskeletal:  Positive for joint pain. Negative for myalgias.  Skin:  Negative for rash.  Neurological:  Negative for weakness.  Endo/Heme/Allergies: Negative.   Psychiatric/Behavioral:  The patient is nervous/anxious.    Objective:   Vitals:   12/22/21 1331  BP: (!) 150/82  Pulse: 91  SpO2: 98%  Weight: 140 lb (63.5 kg)  Height: 5\' 11"  (1.803 m)   Physical Exam Constitutional:      General: He is not in acute distress. HENT:     Head: Normocephalic and atraumatic.  Eyes:     Extraocular Movements: Extraocular movements intact.     Conjunctiva/sclera: Conjunctivae normal.     Pupils: Pupils are equal, round, and reactive to light.  Cardiovascular:     Rate and Rhythm: Normal rate and regular rhythm.     Pulses: Normal pulses.     Heart sounds: Normal heart sounds. No murmur heard. Pulmonary:     Effort: Pulmonary effort is normal.     Breath sounds: Wheezing (left anterior, mild) present.  Abdominal:     General: Bowel sounds are normal.     Palpations: Abdomen is soft.  Musculoskeletal:     Right lower leg: No edema.     Left lower leg: No edema.  Lymphadenopathy:     Cervical: No cervical adenopathy.   Skin:    General: Skin is warm and dry.  Neurological:     General: No focal deficit present.     Mental Status: He is alert.  Psychiatric:        Mood and Affect: Mood normal.        Behavior: Behavior normal.        Thought Content: Thought content normal.        Judgment: Judgment normal.   CBC  Component Value Date/Time   WBC 12.2 (H) 03/29/2020 1502   RBC 3.74 (L) 03/29/2020 1502   HGB 12.7 (L) 03/29/2020 1502   HCT 37.9 (L) 03/29/2020 1502   PLT 165 03/29/2020 1502   MCV 101.3 (H) 03/29/2020 1502   MCH 34.0 03/29/2020 1502   MCHC 33.5 03/29/2020 1502   RDW 13.5 03/29/2020 1502   LYMPHSABS 1.5 03/29/2020 1502   MONOABS 1.8 (H) 03/29/2020 1502   EOSABS 0.2 03/29/2020 1502   BASOSABS 0.1 03/29/2020 1502      Latest Ref Rng & Units 03/29/2020    3:02 PM 05/08/2013   11:25 AM 07/22/2012    4:46 AM  BMP  Glucose 70 - 99 mg/dL 854   92   627    BUN 6 - 20 mg/dL 17   10   12     Creatinine 0.61 - 1.24 mg/dL   0.35   0.09    Sodium 135 - 145 mmol/L 132   141   134    Potassium 3.5 - 5.1 mmol/L 4.4   4.6   3.6    Chloride 98 - 111 mmol/L 100   103   101    CO2 22 - 32 mmol/L 21    25    Calcium 8.9 - 10.3 mg/dL 9.0    8.4     Chest imaging: CXR 02/04/21 The lungs are clear. Heart size is normal. Aortic atherosclerosis. No pneumothorax or pleural fluid. No acute or focal bony abnormality.  PFT:     View : No data to display.         Labs:  Path:  Echo 2023: LVEF 60-65%. Grade I diastolic dysfunction. RV systolic function and size is normal. Positive bubble study after 2-3 cardiac cycles suggestive of interatrial shunt.   Heart Catheterization:  Assessment & Plan:   Shortness of breath - Plan: Pulmonary Function Test  Cigarette smoker - Plan: VAS 2024 ABI WITH/WO TBI, Ambulatory Referral for Lung Cancer Scre  Other back pain, unspecified chronicity - Plan: Ambulatory referral to Neurosurgery  Bilateral leg numbness - Plan: Ambulatory referral to  Neurosurgery, VAS [place] ABI WITH/WO TBI  Discussion: Christopher Logan is a 61 year old male, daily smoker with history of hypertension who is referred to pulmonary clinic for shortness of breath.   His progressive dyspnea could be due to obstructive lung disease given his significant smoking history. He has quit smoking 1 week ago. He has positive bubble study on exam which could be contributing to his dyspnea.  He is to try breztri inhaler 2 puffs twice daily. He can let 67 know if he would like prescription in the future.   We will refer him to lung cancer screening program  We will refer him to neurosurgery for back pain and leg numbness. We will check ABI for leg numbness to rule out vascular etiology.   Follow up in 2 months with PFTs.  [place], MD [facility] Office: [phone]    Current Outpatient Medications:    lisinopril (ZESTRIL) 40 MG tablet, Take 40 mg by mouth daily., Disp: , Rfl:    omeprazole (PRILOSEC) 40 MG capsule, Take 40 mg by mouth daily., Disp: , Rfl:

## 2021-12-24 ENCOUNTER — Other Ambulatory Visit: Payer: Self-pay

## 2021-12-27 ENCOUNTER — Ambulatory Visit (HOSPITAL_COMMUNITY)
Admission: RE | Admit: 2021-12-27 | Discharge: 2021-12-27 | Disposition: A | Discharge status: Home / Self Care | Payer: 59 | Source: Ambulatory Visit | Attending: Pulmonary Disease | Admitting: Pulmonary Disease

## 2021-12-27 DIAGNOSIS — R200 Anesthesia of skin: Secondary | ICD-10-CM | POA: Diagnosis present

## 2021-12-27 DIAGNOSIS — F17210 Nicotine dependence, cigarettes, uncomplicated: Secondary | ICD-10-CM | POA: Diagnosis present

## 2022-02-11 ENCOUNTER — Emergency Department (HOSPITAL_COMMUNITY): Payer: 59

## 2022-02-11 ENCOUNTER — Other Ambulatory Visit: Payer: Self-pay

## 2022-02-11 ENCOUNTER — Inpatient Hospital Stay (HOSPITAL_COMMUNITY)
Admission: EM | Admit: 2022-02-11 | Discharge: 2022-02-15 | DRG: 378 | Disposition: A | Discharge status: Home / Self Care | Payer: 59 | Attending: Internal Medicine | Admitting: Internal Medicine

## 2022-02-11 ENCOUNTER — Encounter (HOSPITAL_COMMUNITY): Payer: Self-pay

## 2022-02-11 DIAGNOSIS — Z79899 Other long term (current) drug therapy: Secondary | ICD-10-CM

## 2022-02-11 DIAGNOSIS — D6959 Other secondary thrombocytopenia: Secondary | ICD-10-CM | POA: Diagnosis present

## 2022-02-11 DIAGNOSIS — Z91030 Bee allergy status: Secondary | ICD-10-CM

## 2022-02-11 DIAGNOSIS — Z87891 Personal history of nicotine dependence: Secondary | ICD-10-CM

## 2022-02-11 DIAGNOSIS — I10 Essential (primary) hypertension: Secondary | ICD-10-CM | POA: Diagnosis present

## 2022-02-11 DIAGNOSIS — K219 Gastro-esophageal reflux disease without esophagitis: Secondary | ICD-10-CM | POA: Diagnosis present

## 2022-02-11 DIAGNOSIS — D62 Acute posthemorrhagic anemia: Secondary | ICD-10-CM

## 2022-02-11 DIAGNOSIS — E872 Acidosis, unspecified: Secondary | ICD-10-CM | POA: Diagnosis present

## 2022-02-11 DIAGNOSIS — M109 Gout, unspecified: Secondary | ICD-10-CM | POA: Diagnosis present

## 2022-02-11 DIAGNOSIS — K2100 Gastro-esophageal reflux disease with esophagitis, without bleeding: Secondary | ICD-10-CM | POA: Diagnosis present

## 2022-02-11 DIAGNOSIS — Z885 Allergy status to narcotic agent status: Secondary | ICD-10-CM | POA: Diagnosis not present

## 2022-02-11 DIAGNOSIS — I252 Old myocardial infarction: Secondary | ICD-10-CM | POA: Diagnosis not present

## 2022-02-11 DIAGNOSIS — B182 Chronic viral hepatitis C: Secondary | ICD-10-CM | POA: Diagnosis present

## 2022-02-11 DIAGNOSIS — K254 Chronic or unspecified gastric ulcer with hemorrhage: Principal | ICD-10-CM | POA: Diagnosis present

## 2022-02-11 DIAGNOSIS — K259 Gastric ulcer, unspecified as acute or chronic, without hemorrhage or perforation: Secondary | ICD-10-CM | POA: Diagnosis not present

## 2022-02-11 DIAGNOSIS — K3189 Other diseases of stomach and duodenum: Secondary | ICD-10-CM | POA: Diagnosis present

## 2022-02-11 DIAGNOSIS — F1010 Alcohol abuse, uncomplicated: Secondary | ICD-10-CM | POA: Diagnosis present

## 2022-02-11 DIAGNOSIS — G8929 Other chronic pain: Secondary | ICD-10-CM | POA: Diagnosis present

## 2022-02-11 DIAGNOSIS — R079 Chest pain, unspecified: Secondary | ICD-10-CM | POA: Diagnosis not present

## 2022-02-11 DIAGNOSIS — K922 Gastrointestinal hemorrhage, unspecified: Secondary | ICD-10-CM | POA: Diagnosis present

## 2022-02-11 DIAGNOSIS — K766 Portal hypertension: Secondary | ICD-10-CM | POA: Diagnosis present

## 2022-02-11 DIAGNOSIS — D696 Thrombocytopenia, unspecified: Secondary | ICD-10-CM | POA: Diagnosis not present

## 2022-02-11 DIAGNOSIS — I959 Hypotension, unspecified: Secondary | ICD-10-CM | POA: Diagnosis present

## 2022-02-11 DIAGNOSIS — R200 Anesthesia of skin: Secondary | ICD-10-CM | POA: Diagnosis present

## 2022-02-11 DIAGNOSIS — I248 Other forms of acute ischemic heart disease: Secondary | ICD-10-CM | POA: Diagnosis present

## 2022-02-11 DIAGNOSIS — D649 Anemia, unspecified: Principal | ICD-10-CM

## 2022-02-11 DIAGNOSIS — K279 Peptic ulcer, site unspecified, unspecified as acute or chronic, without hemorrhage or perforation: Secondary | ICD-10-CM

## 2022-02-11 DIAGNOSIS — E871 Hypo-osmolality and hyponatremia: Secondary | ICD-10-CM | POA: Diagnosis present

## 2022-02-11 DIAGNOSIS — Z87892 Personal history of anaphylaxis: Secondary | ICD-10-CM | POA: Diagnosis not present

## 2022-02-11 DIAGNOSIS — K7460 Unspecified cirrhosis of liver: Secondary | ICD-10-CM | POA: Diagnosis present

## 2022-02-11 DIAGNOSIS — K264 Chronic or unspecified duodenal ulcer with hemorrhage: Secondary | ICD-10-CM | POA: Diagnosis present

## 2022-02-11 DIAGNOSIS — K269 Duodenal ulcer, unspecified as acute or chronic, without hemorrhage or perforation: Secondary | ICD-10-CM

## 2022-02-11 LAB — HEPATIC FUNCTION PANEL
ALT: 19 U/L (ref 0–44)
AST: 40 U/L (ref 15–41)
Albumin: 3.1 g/dL — ABNORMAL LOW (ref 3.5–5.0)
Alkaline Phosphatase: 17 U/L — ABNORMAL LOW (ref 38–126)
Bilirubin, Direct: <0.1 mg/dL (ref 0.0–0.2)
Total Bilirubin: 0.6 mg/dL (ref 0.3–1.2)
Total Protein: 5.6 g/dL — ABNORMAL LOW (ref 6.5–8.1)

## 2022-02-11 LAB — BASIC METABOLIC PANEL
Anion gap: 9 (ref 5–15)
BUN: 27 mg/dL — ABNORMAL HIGH (ref 6–20)
CO2: 20 mmol/L — ABNORMAL LOW (ref 22–32)
Calcium: 8.8 mg/dL — ABNORMAL LOW (ref 8.9–10.3)
Chloride: 102 mmol/L (ref 98–111)
Creatinine, Ser: 0.92 mg/dL (ref 0.61–1.24)
GFR, Estimated: >60 mL/min (ref >60)
Glucose, Bld: 104 mg/dL — ABNORMAL HIGH (ref 70–99)
Potassium: 3.9 mmol/L (ref 3.5–5.1)
Sodium: 131 mmol/L — ABNORMAL LOW (ref 135–145)

## 2022-02-11 LAB — CBC
HCT: 19.4 % — ABNORMAL LOW (ref 39.0–52.0)
Hemoglobin: 6.6 g/dL — CL (ref 13.0–17.0)
MCH: 33.8 pg (ref 26.0–34.0)
MCHC: 34 g/dL (ref 30.0–36.0)
MCV: 99.5 fL (ref 80.0–100.0)
Platelets: 108 10*3/uL — ABNORMAL LOW (ref 150–400)
RBC: 1.95 MIL/uL — ABNORMAL LOW (ref 4.22–5.81)
RDW: 13.7 % (ref 11.5–15.5)
WBC: 11.9 10*3/uL — ABNORMAL HIGH (ref 4.0–10.5)
nRBC: 0.2 % (ref 0.0–0.2)

## 2022-02-11 LAB — I-STAT CHEM 8, ED
BUN: 29 mg/dL — ABNORMAL HIGH (ref 6–20)
Calcium, Ion: 1.11 mmol/L — ABNORMAL LOW (ref 1.15–1.40)
Chloride: 98 mmol/L (ref 98–111)
Creatinine, Ser: 0.9 mg/dL (ref 0.61–1.24)
Glucose, Bld: 97 mg/dL (ref 70–99)
HCT: 21 % — ABNORMAL LOW (ref 39.0–52.0)
Hemoglobin: 7.1 g/dL — ABNORMAL LOW (ref 13.0–17.0)
Potassium: 4 mmol/L (ref 3.5–5.1)
Sodium: 131 mmol/L — ABNORMAL LOW (ref 135–145)
TCO2: 21 mmol/L — ABNORMAL LOW (ref 22–32)

## 2022-02-11 LAB — LACTIC ACID, PLASMA
Lactic Acid, Venous: 3.1 mmol/L (ref 0.5–1.9)
Lactic Acid, Venous: 1.4 mmol/L (ref 0.5–1.9)

## 2022-02-11 LAB — URINALYSIS, ROUTINE W REFLEX MICROSCOPIC
Bilirubin Urine: NEGATIVE
Glucose, UA: NEGATIVE mg/dL
Hgb urine dipstick: NEGATIVE
Ketones, ur: NEGATIVE mg/dL
Leukocytes,Ua: NEGATIVE
Nitrite: NEGATIVE
Protein, ur: NEGATIVE mg/dL
Specific Gravity, Urine: 1.021 (ref 1.005–1.030)
pH: 6 (ref 5.0–8.0)

## 2022-02-11 LAB — LIPASE, BLOOD: Lipase: 39 U/L (ref 11–51)

## 2022-02-11 LAB — HEMOGLOBIN AND HEMATOCRIT, BLOOD
HCT: 19.3 % — ABNORMAL LOW (ref 39.0–52.0)
Hemoglobin: 6.5 g/dL — CL (ref 13.0–17.0)

## 2022-02-11 LAB — PROTIME-INR
INR: 1.1 (ref 0.8–1.2)
Prothrombin Time: 14.2 seconds (ref 11.4–15.2)

## 2022-02-11 LAB — POC OCCULT BLOOD, ED: Fecal Occult Bld: POSITIVE — AB

## 2022-02-11 LAB — HIV ANTIBODY (ROUTINE TESTING W REFLEX): HIV Screen 4th Generation wRfx: NONREACTIVE

## 2022-02-11 LAB — PREPARE RBC (CROSSMATCH)

## 2022-02-11 LAB — ABO/RH: ABO/RH(D): A POS

## 2022-02-11 LAB — TROPONIN I (HIGH SENSITIVITY)
Troponin I (High Sensitivity): 13 ng/L (ref <18)
Troponin I (High Sensitivity): 41 ng/L — ABNORMAL HIGH (ref <18)

## 2022-02-11 MED ORDER — FENTANYL CITRATE PF 50 MCG/ML IJ SOSY
50.0000 ug | PREFILLED_SYRINGE | Freq: Once | INTRAMUSCULAR | Status: AC
  Administered 2022-02-11: 50 ug via INTRAVENOUS
  Filled 2022-02-11: qty 1

## 2022-02-11 MED ORDER — ONDANSETRON HCL 4 MG/2ML IJ SOLN
4.0000 mg | Freq: Once | INTRAMUSCULAR | Status: AC
  Administered 2022-02-11: 4 mg via INTRAVENOUS
  Filled 2022-02-11: qty 2

## 2022-02-11 MED ORDER — SODIUM CHLORIDE 0.9 % IV SOLN
2.0000 g | INTRAVENOUS | Status: DC
  Administered 2022-02-12 (×2): 2 g via INTRAVENOUS
  Filled 2022-02-11 (×2): qty 20

## 2022-02-11 MED ORDER — ACETAMINOPHEN 325 MG PO TABS
325.0000 mg | ORAL_TABLET | Freq: Four times a day (QID) | ORAL | Status: DC | PRN
  Administered 2022-02-13: 325 mg via ORAL
  Filled 2022-02-11: qty 1

## 2022-02-11 MED ORDER — SODIUM CHLORIDE 0.9 % IV SOLN
50.0000 ug/h | INTRAVENOUS | Status: DC
  Administered 2022-02-11 – 2022-02-12 (×2): 50 ug/h via INTRAVENOUS
  Filled 2022-02-11 (×2): qty 1

## 2022-02-11 MED ORDER — MELATONIN 5 MG PO TABS
5.0000 mg | ORAL_TABLET | Freq: Every evening | ORAL | Status: DC | PRN
  Administered 2022-02-13 – 2022-02-14 (×2): 5 mg via ORAL
  Filled 2022-02-11 (×2): qty 1

## 2022-02-11 MED ORDER — THIAMINE HCL 100 MG PO TABS
100.0000 mg | ORAL_TABLET | Freq: Every day | ORAL | Status: DC
Start: 2022-02-12 — End: 2022-02-15
  Administered 2022-02-14 – 2022-02-15 (×2): 100 mg via ORAL
  Filled 2022-02-11 (×3): qty 1

## 2022-02-11 MED ORDER — LORAZEPAM 2 MG/ML IJ SOLN
1.0000 mg | INTRAMUSCULAR | Status: AC | PRN
  Administered 2022-02-12 – 2022-02-13 (×2): 1 mg via INTRAVENOUS
  Filled 2022-02-11 (×2): qty 1

## 2022-02-11 MED ORDER — PANTOPRAZOLE SODIUM 40 MG IV SOLR
40.0000 mg | Freq: Once | INTRAVENOUS | Status: AC
  Administered 2022-02-11: 40 mg via INTRAVENOUS
  Filled 2022-02-11: qty 10

## 2022-02-11 MED ORDER — PANTOPRAZOLE SODIUM 40 MG IV SOLR
40.0000 mg | Freq: Once | INTRAVENOUS | Status: AC
Start: 2022-02-11 — End: 2022-02-12
  Administered 2022-02-12: 40 mg via INTRAVENOUS
  Filled 2022-02-11: qty 10

## 2022-02-11 MED ORDER — HYDROMORPHONE HCL 1 MG/ML IJ SOLN
1.0000 mg | Freq: Once | INTRAMUSCULAR | Status: AC | PRN
  Administered 2022-02-11: 1 mg via INTRAVENOUS
  Filled 2022-02-11: qty 1

## 2022-02-11 MED ORDER — LORAZEPAM 1 MG PO TABS
1.0000 mg | ORAL_TABLET | ORAL | Status: AC | PRN
  Administered 2022-02-12: 2 mg via ORAL
  Administered 2022-02-13: 1 mg via ORAL
  Filled 2022-02-11: qty 1
  Filled 2022-02-11: qty 2

## 2022-02-11 MED ORDER — LACTATED RINGERS IV BOLUS
1000.0000 mL | Freq: Once | INTRAVENOUS | Status: AC
  Administered 2022-02-11: 1000 mL via INTRAVENOUS

## 2022-02-11 MED ORDER — ADULT MULTIVITAMIN W/MINERALS CH
1.0000 | ORAL_TABLET | Freq: Every day | ORAL | Status: DC
Start: 2022-02-12 — End: 2022-02-15
  Administered 2022-02-12 – 2022-02-15 (×4): 1 via ORAL
  Filled 2022-02-11 (×4): qty 1

## 2022-02-11 MED ORDER — PROCHLORPERAZINE EDISYLATE 10 MG/2ML IJ SOLN
10.0000 mg | Freq: Four times a day (QID) | INTRAMUSCULAR | Status: DC | PRN
Start: 2022-02-11 — End: 2022-02-15

## 2022-02-11 MED ORDER — HYDROMORPHONE HCL 1 MG/ML IJ SOLN
0.5000 mg | INTRAMUSCULAR | Status: DC | PRN
  Administered 2022-02-12 – 2022-02-15 (×8): 0.5 mg via INTRAVENOUS
  Filled 2022-02-11 (×8): qty 0.5

## 2022-02-11 MED ORDER — PANTOPRAZOLE 80MG IVPB - SIMPLE MED: 80.0000 mg | Freq: Once | INTRAVENOUS | Status: DC

## 2022-02-11 MED ORDER — SODIUM CHLORIDE 0.9 % IV SOLN
10.0000 mL/h | Freq: Once | INTRAVENOUS | Status: AC
  Administered 2022-02-12: 10 mL/h via INTRAVENOUS

## 2022-02-11 MED ORDER — OCTREOTIDE LOAD VIA INFUSION
50.0000 ug | Freq: Once | INTRAVENOUS | Status: AC
  Administered 2022-02-11: 50 ug via INTRAVENOUS
  Filled 2022-02-11: qty 25

## 2022-02-11 MED ORDER — PANTOPRAZOLE SODIUM 40 MG IV SOLR: 40.0000 mg | Freq: Two times a day (BID) | INTRAVENOUS | Status: DC

## 2022-02-11 MED ORDER — FOLIC ACID 1 MG PO TABS
1.0000 mg | ORAL_TABLET | Freq: Every day | ORAL | Status: DC
  Administered 2022-02-12 – 2022-02-15 (×4): 1 mg via ORAL
  Filled 2022-02-11 (×4): qty 1

## 2022-02-11 MED ORDER — IOHEXOL 350 MG/ML SOLN
100.0000 mL | Freq: Once | INTRAVENOUS | Status: AC | PRN
  Administered 2022-02-11: 100 mL via INTRAVENOUS

## 2022-02-11 MED ORDER — THIAMINE HCL 100 MG/ML IJ SOLN
100.0000 mg | Freq: Every day | INTRAMUSCULAR | Status: DC
Start: 2022-02-12 — End: 2022-02-15
  Administered 2022-02-12 – 2022-02-13 (×2): 100 mg via INTRAVENOUS
  Filled 2022-02-11: qty 2

## 2022-02-11 MED ORDER — OXYCODONE HCL 5 MG PO TABS
5.0000 mg | ORAL_TABLET | Freq: Four times a day (QID) | ORAL | Status: DC | PRN
  Administered 2022-02-12 – 2022-02-14 (×2): 5 mg via ORAL
  Filled 2022-02-11 (×2): qty 1

## 2022-02-11 MED ORDER — PANTOPRAZOLE INFUSION (NEW) - SIMPLE MED
8.0000 mg/h | INTRAVENOUS | Status: DC
  Administered 2022-02-11 – 2022-02-13 (×3): 8 mg/h via INTRAVENOUS
  Filled 2022-02-11 (×3): qty 80

## 2022-02-11 MED ORDER — SODIUM CHLORIDE 0.9 % IV BOLUS
1000.0000 mL | Freq: Once | INTRAVENOUS | Status: AC
  Administered 2022-02-11: 1000 mL via INTRAVENOUS

## 2022-02-11 MED ORDER — ALBUMIN HUMAN 25 % IV SOLN
12.5000 g | Freq: Four times a day (QID) | INTRAVENOUS | Status: AC
  Administered 2022-02-12 (×3): 12.5 g via INTRAVENOUS
  Filled 2022-02-11 (×3): qty 50

## 2022-02-11 NOTE — ED Notes (Signed)
Consent for blood transfusion obtained 

## 2022-02-11 NOTE — ED Notes (Signed)
MD notified of critical lactic  

## 2022-02-11 NOTE — ED Notes (Signed)
Admitting MD at bedside.

## 2022-02-11 NOTE — ED Triage Notes (Signed)
Pt arrived to ED via EMS from home w/ c/o CP across the chest w/ radiation straight through the back w/ headache and shob. Pt took nitro prior to EMS arrival and had syncopal episode w/ incontinence. Hypotensive upon EMS arrival 70/40. EMS gave 324mg  ASA and 500 NS bolus. 20g L hand. Last BP 98/60, HR 102, 99% RA

## 2022-02-11 NOTE — ED Notes (Signed)
To CT at this time.

## 2022-02-11 NOTE — ED Provider Notes (Signed)
[facility] EMERGENCY DEPARTMENT Provider Note   CSN: [id number] Arrival date & time: 02/11/22  1840     History  Chief Complaint  Patient presents with   Chest Pain   Hypotension    Christopher Logan is a 61 y.o. male.  HPI 61 year old male with a history of smoking, hypertension, and prior MI presents with severe chest pain and syncope.  History is from MS who reports that the patient passed out after taking a nitro.  Patient states he developed severe pain this evening which has gone to his back, bilateral shoulders and neck.  Took a nitro and on his way to the bathroom to have a bowel movement he passed out.  Pain is still severe.  He feels short of breath.  He denies any bloody stools.  Home Medications Prior to Admission medications   Medication Sig Start Date End Date Taking? Authorizing Provider  lisinopril (ZESTRIL) 40 MG tablet Take 40 mg by mouth daily.    [provider]  omeprazole (PRILOSEC) 40 MG capsule Take 40 mg by mouth daily.    [provider]      Allergies    Bee venom and Codeine    Review of Systems   Review of Systems  Unable to perform ROS: Acuity of condition    Physical Exam Updated Vital Signs BP 94/62   Pulse (!) 109   Temp 97.9 F (36.6 C) (Oral)   Resp (!) 24   Ht 5\' 11"  (1.803 m)   Wt 62.6 kg   SpO2 100%   BMI 19.25 kg/m  Physical Exam Vitals and nursing note reviewed.  Constitutional:      General: He is in acute distress.     Appearance: He is well-developed. He is ill-appearing.  HENT:     Head: Normocephalic and atraumatic.  Cardiovascular:     Rate and Rhythm: Regular rhythm. Tachycardia present.     Pulses:          Radial pulses are 2+ on the right side and 2+ on the left side.       Dorsalis pedis pulses are 2+ on the right side and 2+ on the left side.     Heart sounds: Normal heart sounds.  Pulmonary:     Effort: Pulmonary effort is normal. Tachypnea present.     Breath  sounds: Normal breath sounds.  Abdominal:     General: There is no distension.     Palpations: Abdomen is soft.     Tenderness: There is no abdominal tenderness.  Skin:    General: Skin is warm and dry.  Neurological:     Mental Status: He is alert.     ED Results / Procedures / Treatments   Labs (all labs ordered are listed, but only abnormal results are displayed) Labs Reviewed  I-STAT CHEM 8, ED - Abnormal; Notable for the following components:      Result Value   Sodium 131 (*)    BUN 29 (*)    Calcium, Ion 1.11 (*)    TCO2 21 (*)    Hemoglobin 7.1 (*)    HCT 21.0 (*)    All other components within normal limits  BASIC METABOLIC PANEL  CBC  HEPATIC FUNCTION PANEL  LACTIC ACID, PLASMA  LACTIC ACID, PLASMA  LIPASE, BLOOD  POC OCCULT BLOOD, ED  PREPARE RBC (CROSSMATCH)  TYPE AND SCREEN  TROPONIN I (HIGH SENSITIVITY)    EKG EKG Interpretation  Date/Time:  Friday February 11 2022 18:47:15 EDT Ventricular Rate:  105 PR Interval:  161 QRS Duration: 97 QT Interval:  365 QTC Calculation: 483 R Axis:   66 Text Interpretation: Sinus tachycardia Probable left atrial enlargement Probable inferior infarct, old Probable anterolateral infarct, old Baseline wander in lead(s) V2 subtle lateral ST depressions Confirmed by Pricilla Loveless [phone]) on 02/11/2022 7:18:52 PM  Radiology DG Chest Portable 1 View  Result Date: 02/11/2022 CLINICAL DATA:  Chest pain EXAM: PORTABLE CHEST 1 VIEW COMPARISON:  02/04/2021 FINDINGS: The heart size and mediastinal contours are within normal limits. Both lungs are clear. The visualized skeletal structures are unremarkable. IMPRESSION: No active disease. Electronically Signed   By: Ernie Avena M.D.   On: 02/11/2022 19:08    Procedures .Critical Care  Performed by: Pricilla Loveless, MD Authorized by: Pricilla Loveless, MD   Critical care provider statement:    Critical care time (minutes):  45   Critical care time was exclusive of:   Separately billable procedures and treating other patients   Critical care was necessary to treat or prevent imminent or life-threatening deterioration of the following conditions:  Circulatory failure, cardiac failure and shock   Critical care was time spent personally by me on the following activities:  Development of treatment plan with patient or surrogate, discussions with consultants, evaluation of patient's response to treatment, examination of patient, ordering and review of laboratory studies, ordering and review of radiographic studies, ordering and performing treatments and interventions, pulse oximetry, re-evaluation of patient's condition and review of old charts     Medications Ordered in ED Medications  lactated ringers bolus 1,000 mL (has no administration in time range)  0.9 %  sodium chloride infusion (has no administration in time range)  fentaNYL (SUBLIMAZE) injection 50 mcg (has no administration in time range)  pantoprazole (PROTONIX) injection 40 mg (has no administration in time range)  ondansetron (ZOFRAN) injection 4 mg (has no administration in time range)  sodium chloride 0.9 % bolus 1,000 mL (0 mLs Intravenous Stopped 02/11/22 1917)  fentaNYL (SUBLIMAZE) injection 50 mcg (50 mcg Intravenous Given 02/11/22 1851)  iohexol (OMNIPAQUE) 350 MG/ML injection 100 mL (100 mLs Intravenous Contrast Given 02/11/22 1903)    ED Course/ Medical Decision Making/ A&P                           Medical Decision Making Amount and/or Complexity of Data Reviewed Labs: ordered.    Details: Hemoglobin 6.6, elevated BUN but normal renal function.  Initial troponin normal, second is bumped to 40 Radiology: ordered and independent interpretation performed.    Details: Chest x-ray without acute pneumothorax.  CTA without dissection ECG/medicine tests: ordered and independent interpretation performed.    Details: Initial ECG nonspecific, second ECG with significant ST depressions and then  went back to normal on third ECG  Risk Prescription drug management. Decision regarding hospitalization.   Patient is quite ill-appearing on arrival.  High concern for dissection so after initial chest x-ray to rule out pneumothorax, he was taken to CT angiography for dissection rule out.  This is unremarkable.  Benign abdominal exam.  He was given IV fentanyl and then later IV Dilaudid for pain.  He was started on IV fluids.  His hypotension improved and his work-up shows a significant drop in hemoglobin from around 12 in the past to 6.6.  He had diffuse melena on arrival.  He was transfused 2 units of blood.  He was given IV  Protonix and given history of cirrhosis on chart review, he was also given octreotide.  I discussed with Dr. Marca Ancona of GI, who advises keep n.p.o. and she will probably scope tomorrow morning.  He has thankfully stabilized and his chest pain and severe pain is gone.  I discussed with the hospitalist, Dr. Margo Aye, who will admit.  Of note, during this period of severe pain a repeat EKG had been obtained which shows significant ST depressions and aVR elevation.  My suspicion is this is secondary to significant anemia.  Discussed with cardiology on-call, Dr. Clifton James, who feels Cath Lab is not warranted and this is probably secondary to the anemia/hypovolemia.  Advises against emergent Cath Lab/heparin.  I agree.  Will need close monitoring.  I suspect the second troponin going up to 41 is secondary to the above and not ACS.        Final Clinical Impression(s) / ED Diagnoses Final diagnoses:  None    Rx / DC Orders ED Discharge Orders     None         Pricilla Loveless, MD 02/11/22 2317

## 2022-02-11 NOTE — H&P (Addendum)
History and Physical  Christopher Logan UXN:[id number] DOB: 04/29/1961 DOA: 02/11/2022  Referring physician: Dr. Criss Alvine, EDP  PCP: [name], MD  Outpatient Specialists: GI Patient coming from: Home  Chief Complaint: Chest pain and back pain.  HPI: Christopher Logan is a 61 y.o. male with medical history significant for hypertension, former tobacco user with 50+ pack year smoking history, ongoing alcohol abuse, GERD, treated hepatitis C with cirrhosis of liver, portal hypertension, chronic back pain and bilateral lower extremity numbness (referred to neurosurgery), who presented to [facility] ED via EMS due to severe chest pain and back pain.  He took a sublingual nitroglycerin at home, felt dizzy, and nearly passed out.  Denies loss of consciousness.  EMS was activated.  Upon EMS arrival, the patient was hypotensive with BP 70/40, improved with 500 cc normal saline bolus.  He received a full dose aspirin in route 324 mg x 1 for chest pain.    Endorses daily excessive alcohol use, liquor.  Last use of alcohol was the morning of his presentation.  Admits to frequent use of Goody powder for his back pain.  He noted black tarry stools that started today.  Denies hematemesis.  Admits to emesis every other day for the past 2 weeks.  In the ED, the patient is noted to have melena from bowel incontinence, with positive FOBT.  Presenting hemoglobin 6.6 K.  Dissection study negative for aortic dissection or aortic aneurysm however revealed evidence of reflux esophagitis.  EDP discussed the case with [facility] Dr. Marca Ancona.  2 units PRBCs ordered to be transfused.  The patient was started on octreotide drip and Protonix drip.  Rocephin added prophylactically due to concern for upper GI bleed in the setting of portal hypertension and liver cirrhosis.  EDP requested admission.  The patient was admitted by [facility], hospitalist service.  At the time of this visit, his chest pain had resolved.  ED Course: Tmax 97.9.  BP  119/77, pulse 102, respiration rate 22, O2 saturation 100% on room air.  Lab studies remarkable for serum sodium 131, serum bicarb 20, BUN 27, creatinine 0.92, GFR greater than 60.  High-sensitivity troponin 13.  Lactic acid 3.1.  WBC 11.9, hemoglobin 6.6, MCV 99.  Platelet count 108.  Urine analysis and urine culture are pending.  Review of Systems: Review of systems as noted in the HPI. All other systems reviewed and are negative.   Past Medical History:  Diagnosis Date   Hypertension    History reviewed. No pertinent surgical history.  Social History:  reports that he quit smoking about 7 weeks ago. His smoking use included cigarettes. He started smoking about 45 years ago. He smoked an average of 1.5 packs per day. He has never been exposed to tobacco smoke. He has never used smokeless tobacco. He reports current alcohol use. He reports current drug use. Drug: Marijuana.   Allergies  Allergen Reactions   Bee Venom Anaphylaxis   Codeine Nausea Only    Family History  Problem Relation Age of Onset   Cancer Mother       Prior to Admission medications   Medication Sig Start Date End Date Taking? Authorizing Provider  lisinopril (ZESTRIL) 40 MG tablet Take 40 mg by mouth daily.    [provider]  omeprazole (PRILOSEC) 40 MG capsule Take 40 mg by mouth daily.    [provider]    Physical Exam: BP 126/74   Pulse 96   Temp 97.9 F (36.6 C) (Oral)  Resp (!) 26   Ht 5\' 11"  (1.803 m)   Wt 62.6 kg   SpO2 100%   BMI 19.25 kg/m   General: 61 y.o. year-old male well developed well nourished in no acute distress.  Alert and oriented x3. Cardiovascular: Tachycardic with no rubs or gallops.  No thyromegaly or JVD noted.  No lower extremity edema. 2/4 pulses in all 4 extremities. Respiratory: Clear to auscultation with no wheezes or rales. Good inspiratory effort. Abdomen: Soft nontender nondistended with normal bowel sounds x4 quadrants. Muskuloskeletal: No  cyanosis, clubbing or edema noted bilaterally Neuro: CN II-XII intact, strength, sensation, reflexes Skin: No ulcerative lesions noted or rashes Psychiatry: Judgement and insight appear normal. Mood is appropriate for condition and setting          Labs on Admission:  Basic Metabolic Panel: Recent Labs  Lab 02/11/22 1845 02/11/22 1850  NA 131* 131*  K 3.9 4.0  CL 102 98  CO2 20*  --   GLUCOSE 104* 97  BUN 27* 29*  CREATININE 0.92 0.90  CALCIUM 8.8*  --    Liver Function Tests: Recent Labs  Lab 02/11/22 1845  AST 40  ALT 19  ALKPHOS 17*  BILITOT 0.6  PROT 5.6*  ALBUMIN 3.1*   Recent Labs  Lab 02/11/22 1845  LIPASE 39   No results for input(s): "AMMONIA" in the last 168 hours. CBC: Recent Labs  Lab 02/11/22 1845 02/11/22 1850  WBC 11.9*  --   HGB 6.6* 7.1*  HCT 19.4* 21.0*  MCV 99.5  --   PLT 108*  --    Cardiac Enzymes: No results for input(s): "CKTOTAL", "CKMB", "CKMBINDEX", "TROPONINI" in the last 168 hours.  BNP (last 3 results) No results for input(s): "BNP" in the last 8760 hours.  ProBNP (last 3 results) No results for input(s): "PROBNP" in the last 8760 hours.  CBG: No results for input(s): "GLUCAP" in the last 168 hours.  Radiological Exams on Admission: CT Angio Chest/Abd/Pel for Dissection W and/or Wo Contrast  Result Date: 02/11/2022 CLINICAL DATA:  Severe chest pain, shortness of breath EXAM: CT ANGIOGRAPHY CHEST, ABDOMEN AND PELVIS TECHNIQUE: Non-contrast CT of the chest was initially obtained. Multidetector CT imaging through the chest, abdomen and pelvis was performed using the standard protocol during bolus administration of intravenous contrast. Multiplanar reconstructed images and MIPs were obtained and reviewed to evaluate the vascular anatomy. RADIATION DOSE REDUCTION: This exam was performed according to the departmental dose-optimization program which includes automated exposure control, adjustment of the mA and/or kV according to  patient size and/or use of iterative reconstruction technique. CONTRAST:  02/13/2022 OMNIPAQUE IOHEXOL 350 MG/ML SOLN COMPARISON:  CTA chest dated 07/16/2012 FINDINGS: CTA CHEST FINDINGS Cardiovascular: On unenhanced CT, there is no evidence intramural hematoma. Following contrast administration, there is no evidence of thoracic aortic aneurysm or dissection. Atherosclerotic calcifications of the arch. Although not tailored for evaluation, there is no evidence of pulmonary embolism to the segmental level. The heart is normal in size.  No pericardial effusion. Coronary atherosclerosis of the LAD and right coronary artery. Mediastinum/Nodes: No suspicious mediastinal lymphadenopathy. Mild wall thickening of the distal esophagus (series 5/image 125), nonspecific but suggesting reflux esophagitis. No associated pneumomediastinum. Visualized thyroid is unremarkable. Lungs/Pleura: Mild centrilobular emphysematous changes, upper lung predominant. No focal consolidation. No suspicious pulmonary nodules. No pleural effusion or pneumothorax. Musculoskeletal: No fracture is seen. Bilateral ribs are intact. Mild degenerative changes of the lower thoracic spine. Review of the MIP images confirms the above findings. CTA  ABDOMEN AND PELVIS FINDINGS VASCULAR Aorta: No evidence abdominal aortic aneurysm or dissection. Patent. Atherosclerotic calcifications. Celiac: Patent.  Atherosclerotic calcifications at the origin. SMA: Patent.  Atherosclerotic calcifications at the origin. Renals: Patent. Proximal atherosclerotic calcifications bilaterally. IMA: Patent. Inflow: Patent bilaterally.  Atherosclerotic calcifications. Veins: Unremarkable. Review of the MIP images confirms the above findings. NON-VASCULAR Hepatobiliary: Lobulated but non masslike contour anteriorly along segment 4 (series 5/image 142), favored to reflect unusual orientation of normal hepatic parenchyma when correlating with sagittal images (sagittal image 72). Gallbladder  is unremarkable. No intrahepatic or extrahepatic ductal dilatation. Pancreas: Within normal limits. No peripancreatic fluid/inflammatory changes. Spleen: Within normal limits. Adrenals/Urinary Tract: Adrenal glands are within normal limits. Left kidney is within normal limits. 15 mm left lower pole renal cyst (series 5/image 198), benign (Bosniak I). No follow-up is recommended. 3 mm nonobstructing interpolar right renal calculus (series 5/image 194). Additional 7 mm nonobstructing right lower pole renal calculus (series 5/image 206). No hydronephrosis. Mildly thick-walled bladder, although underdistended. Stomach/Bowel: Stomach is within normal limits. No evidence of bowel obstruction. Normal appendix (series 5/image 250). No colonic wall thickening or inflammatory changes. Lymphatic: No suspicious abdominopelvic lymphadenopathy. Reproductive: Prostate is unremarkable. Other: No abdominopelvic ascites. No free air. Musculoskeletal: Degenerative changes of the lumbar spine, most prominent at L5-S1. Review of the MIP images confirms the above findings. IMPRESSION: No evidence of thoracoabdominal aortic aneurysm or dissection. No evidence of pulmonary embolism. Mild wall thickening of the distal esophagus, nonspecific but suggesting reflux esophagitis. No pneumomediastinum. Nonobstructing right renal calculi.  No hydronephrosis. Electronically Signed   By: Charline Bills M.D.   On: 02/11/2022 19:32   DG Chest Portable 1 View  Result Date: 02/11/2022 CLINICAL DATA:  Chest pain EXAM: PORTABLE CHEST 1 VIEW COMPARISON:  02/04/2021 FINDINGS: The heart size and mediastinal contours are within normal limits. Both lungs are clear. The visualized skeletal structures are unremarkable. IMPRESSION: No active disease. Electronically Signed   By: Ernie Avena M.D.   On: 02/11/2022 19:08    EKG: I independently viewed the EKG done and my findings are as followed: Sinus rhythm rate of 94.  Nonspecific ST-T changes.   QTc 461.  Assessment/Plan Present on Admission:  GI bleed  Principal Problem:   GI bleed  Upper GI bleed, POA Acute blood loss anemia Presented with hemoglobin of 6.6, melena from stool incontinence Positive FOBT 2 units PRBC ordered to be transfused in the ED Started on octreotide drip and Protonix drip Rocephin added prophylactically in the setting of upper GI bleed and cirrhosis with portal hypertension Clear liquid diet N.p.o. after midnight GI consulted by EDP Albumin 12.5 g x 3 doses Maintain MAP greater than 65 Serial H&H every 6 hours. Transfuse hemoglobin less than 8.  Goal hemoglobin between 7 and 9.  Severe chest pain and back pain, suspect secondary to reflux esophagitis seen on CT scan No evidence of pneumomediastinum No evidence of aortic dissection or aortic aneurysm At the time of this visit his chest pain had resolved. High-sensitivity troponin 13, 41. Started on Protonix drip, continue Continue supportive care As needed analgesics. Monitor on telemetry unit  Elevated troponin suspect demand ischemia in the setting of acute blood loss anemia Initial troponin 13, up-trended 41 At the time of this visit his chest pain had resolved. Closely monitor on telemetry. Repeat twelve-lead EKG.  Liver cirrhosis with portal hypertension Hepatitis C, with prior treatment. Alcohol abuse Avoid hepatotoxic agents. Alcohol cessation counseling at bedside Monitor LFTs. Obtain CMP in the morning  Euvolemic  hyponatremia Serum sodium 131 Repeat chemistry panel in the morning  Lactic acidosis in the setting of upper GI bleed Lactic acid 3.1 on presentation Received 500 cc normal saline bolus via EMS IV albumin 12.5 g every 6 hours x3 doses Repeat lactic acid level  Mild non-anion gap metabolic acidosis in the setting of lactic acidosis Serum bicarb 20, lactic acid 3.1 Treat underlying conditions Repeat CMP in the morning.  Chronic thrombocytopenia in the  setting of liver cirrhosis Platelet count 108 Monitor for now  Essential hypertension, BPs are soft Hold off home oral antihypertensives. Closely monitor vital signs Maintain MAP greater than 65 in the setting of upper GI bleed  Chronic back pain and lower extremity numbness Has been referred to neurosurgery in the outpatient setting Continue supportive care.  Alcohol abuse Endorses daily excessive alcohol use Last use of alcohol was the morning of his presentation Alcohol cessation counseling done at bedside.  The patient is receptive. CIWA protocol TOC consulted to assist with providing resources for alcohol cessation    Critical care time: 65 minutes.    DVT prophylaxis: SCDs.  Pharmacological DVT prophylaxis contraindicated in the setting of upper GI bleed.  Code Status: Full code  Family Communication: Updated his sister at bedside with his permission.  Disposition Plan: Admitted to progressive unit  Consults called: GI consulted by EDP.  Admission status: Inpatient status.   Status is: Inpatient The patient requires at least 2 midnights for further evaluation and treatment of present condition.   Darlin Drop MD [facility] Pager [phone]  If 7PM-7AM, please contact night-coverage [email or web address] Password [email or web address]  02/11/2022, 9:25 PM

## 2022-02-11 NOTE — ED Notes (Signed)
5W asked to initiate purple man

## 2022-02-11 NOTE — ED Notes (Signed)
Per Criss Alvine MD, Xray prior to CT. Xray notified to come to bedside stat

## 2022-02-11 NOTE — ED Notes (Signed)
Pt noted to have black tarry stools and reports it only started today.

## 2022-02-12 ENCOUNTER — Encounter (HOSPITAL_COMMUNITY)
Admission: EM | Disposition: A | Discharge status: Home / Self Care | Payer: Self-pay | Source: Home / Self Care | Attending: Internal Medicine

## 2022-02-12 ENCOUNTER — Encounter (HOSPITAL_COMMUNITY): Payer: Self-pay | Admitting: Internal Medicine

## 2022-02-12 ENCOUNTER — Inpatient Hospital Stay (HOSPITAL_COMMUNITY): Payer: 59 | Admitting: Certified Registered Nurse Anesthetist

## 2022-02-12 DIAGNOSIS — K766 Portal hypertension: Secondary | ICD-10-CM | POA: Diagnosis not present

## 2022-02-12 DIAGNOSIS — F1010 Alcohol abuse, uncomplicated: Secondary | ICD-10-CM

## 2022-02-12 DIAGNOSIS — D62 Acute posthemorrhagic anemia: Secondary | ICD-10-CM

## 2022-02-12 DIAGNOSIS — K922 Gastrointestinal hemorrhage, unspecified: Secondary | ICD-10-CM | POA: Diagnosis not present

## 2022-02-12 DIAGNOSIS — K3189 Other diseases of stomach and duodenum: Secondary | ICD-10-CM

## 2022-02-12 DIAGNOSIS — K279 Peptic ulcer, site unspecified, unspecified as acute or chronic, without hemorrhage or perforation: Secondary | ICD-10-CM | POA: Diagnosis not present

## 2022-02-12 HISTORY — PX: BIOPSY: SHX5522

## 2022-02-12 HISTORY — PX: ESOPHAGOGASTRODUODENOSCOPY (EGD) WITH PROPOFOL: SHX5813

## 2022-02-12 LAB — CBC WITH DIFFERENTIAL/PLATELET
Abs Immature Granulocytes: 0.11 10*3/uL — ABNORMAL HIGH (ref 0.00–0.07)
Basophils Absolute: 0.1 10*3/uL (ref 0.0–0.1)
Basophils Relative: 1 %
Eosinophils Absolute: 0.2 10*3/uL (ref 0.0–0.5)
Eosinophils Relative: 2 %
HCT: 26.6 % — ABNORMAL LOW (ref 39.0–52.0)
Hemoglobin: 9.3 g/dL — ABNORMAL LOW (ref 13.0–17.0)
Immature Granulocytes: 1 %
Lymphocytes Relative: 17 %
Lymphs Abs: 1.6 10*3/uL (ref 0.7–4.0)
MCH: 32.9 pg (ref 26.0–34.0)
MCHC: 35 g/dL (ref 30.0–36.0)
MCV: 94 fL (ref 80.0–100.0)
Monocytes Absolute: 0.8 10*3/uL (ref 0.1–1.0)
Monocytes Relative: 8 %
Neutro Abs: 6.5 10*3/uL (ref 1.7–7.7)
Neutrophils Relative %: 71 %
Platelets: 84 10*3/uL — ABNORMAL LOW (ref 150–400)
RBC: 2.83 MIL/uL — ABNORMAL LOW (ref 4.22–5.81)
RDW: 14.8 % (ref 11.5–15.5)
WBC: 9.2 10*3/uL (ref 4.0–10.5)
nRBC: 0 % (ref 0.0–0.2)

## 2022-02-12 LAB — COMPREHENSIVE METABOLIC PANEL
ALT: 20 U/L (ref 0–44)
AST: 32 U/L (ref 15–41)
Albumin: 3 g/dL — ABNORMAL LOW (ref 3.5–5.0)
Alkaline Phosphatase: 16 U/L — ABNORMAL LOW (ref 38–126)
Anion gap: 6 (ref 5–15)
BUN: 22 mg/dL — ABNORMAL HIGH (ref 6–20)
CO2: 24 mmol/L (ref 22–32)
Calcium: 8.2 mg/dL — ABNORMAL LOW (ref 8.9–10.3)
Chloride: 103 mmol/L (ref 98–111)
Creatinine, Ser: 0.86 mg/dL (ref 0.61–1.24)
GFR, Estimated: >60 mL/min (ref >60)
Glucose, Bld: 140 mg/dL — ABNORMAL HIGH (ref 70–99)
Potassium: 4.5 mmol/L (ref 3.5–5.1)
Sodium: 133 mmol/L — ABNORMAL LOW (ref 135–145)
Total Bilirubin: 0.6 mg/dL (ref 0.3–1.2)
Total Protein: 5.3 g/dL — ABNORMAL LOW (ref 6.5–8.1)

## 2022-02-12 LAB — CBC
HCT: 28.5 % — ABNORMAL LOW (ref 39.0–52.0)
Hemoglobin: 10.1 g/dL — ABNORMAL LOW (ref 13.0–17.0)
MCH: 33.2 pg (ref 26.0–34.0)
MCHC: 35.4 g/dL (ref 30.0–36.0)
MCV: 93.8 fL (ref 80.0–100.0)
Platelets: 84 10*3/uL — ABNORMAL LOW (ref 150–400)
RBC: 3.04 MIL/uL — ABNORMAL LOW (ref 4.22–5.81)
RDW: 15.1 % (ref 11.5–15.5)
WBC: 8.3 10*3/uL (ref 4.0–10.5)
nRBC: 0 % (ref 0.0–0.2)

## 2022-02-12 LAB — MAGNESIUM: Magnesium: 1.6 mg/dL — ABNORMAL LOW (ref 1.7–2.4)

## 2022-02-12 LAB — PHOSPHORUS: Phosphorus: 4 mg/dL (ref 2.5–4.6)

## 2022-02-12 SURGERY — ESOPHAGOGASTRODUODENOSCOPY (EGD) WITH PROPOFOL
Anesthesia: Monitor Anesthesia Care

## 2022-02-12 MED ORDER — PROPOFOL 10 MG/ML IV BOLUS
INTRAVENOUS | Status: DC | PRN
  Administered 2022-02-12: 25 mg via INTRAVENOUS
  Administered 2022-02-12: 20 mg via INTRAVENOUS
  Administered 2022-02-12: 25 mg via INTRAVENOUS

## 2022-02-12 MED ORDER — LIDOCAINE 2% (20 MG/ML) 5 ML SYRINGE
INTRAMUSCULAR | Status: DC | PRN
  Administered 2022-02-12: 60 mg via INTRAVENOUS

## 2022-02-12 MED ORDER — PROPOFOL 500 MG/50ML IV EMUL
INTRAVENOUS | Status: DC | PRN
  Administered 2022-02-12: 100 ug/kg/min via INTRAVENOUS

## 2022-02-12 MED ORDER — LACTATED RINGERS IV SOLN
INTRAVENOUS | Status: AC | PRN
  Administered 2022-02-12: 10 mL/h via INTRAVENOUS

## 2022-02-12 SURGICAL SUPPLY — 15 items

## 2022-02-12 NOTE — Consult Note (Signed)
[facility] Consult  Referring Provider: [facility] hospitalist Primary Care Physician:  [name], MD Primary Gastroenterologist: Dr. Bosie Clos  Reason for Consultation: Melena, anemia, cirrhosis, alcohol abuse, history of hepatitis C  HPI: Christopher Logan is a 61 y.o. male states that he was in his usual state of health until yesterday when he developed severe back and leg pain along with epigastric and chest pain associated with loss of consciousness and incontinence of black liquid loose stools which prompted him to call 911 and come to the ER. Patient has significant history of alcohol use, drinks 2 or 40 ounces of beer daily, and a glass of liquor/whiskey 2-3 times a week. He has history of cirrhosis. In the ER patient was found to have hypotension, anemia and 2 units PRBC transfusion was ordered, patient was started on octreotide and Protonix drip along with IV Rocephin for upper GI bleed with history of cirrhosis.  He reports having chronic hepatitis C which was treated with Epclusa under care of [facility], with [facility]. Her OV from 11/23/21 states his MELDNa was 7. He had elevated ASMA of 43, negative ANA and AMA and elevated IgG of 2118  Patient takes Marlin Canary powders several times a day and Aleve or Advil as needed. EGD 11/11/21: No esophageal varices, cratered 8 mm gastric antral ulcers, positive H. Pylori, was given amoxicillin/clarithromycin/omeprazole for 14 days Colonoscopy 11/11/21, screening: Large internal hemorrhoids, otherwise unremarkable, repeat recommended in 10 years   Past Medical History:  Diagnosis Date   Hypertension     History reviewed. No pertinent surgical history.  Prior to Admission medications   Medication Sig Start Date End Date Taking? Authorizing Provider  lisinopril (ZESTRIL) 40 MG tablet Take 40 mg by mouth daily.    [provider]  omeprazole (PRILOSEC) 40 MG capsule Take 40 mg by mouth daily.    [provider]    Current Facility-Administered Medications  Medication Dose Route Frequency Provider Last Rate Last Admin   acetaminophen (TYLENOL) tablet 325 mg  325 mg Oral Q6H PRN Hall, Carole N, DO       albumin human 25 % solution 12.5 g  12.5 g Intravenous Q6H Hall, Carole N, DO 60 mL/hr at 02/12/22 0813 12.5 g at 02/12/22 0813   cefTRIAXone (ROCEPHIN) 2 g in sodium chloride 0.9 % 100 mL IVPB  2 g Intravenous Q24H Dow Adolph N, DO 200 mL/hr at 02/12/22 0049 2 g at 02/12/22 0049   folic acid (FOLVITE) tablet 1 mg  1 mg Oral Daily Dow Adolph N, DO   1 mg at 02/12/22 0815   HYDROmorphone (DILAUDID) injection 0.5 mg  0.5 mg Intravenous Q4H PRN Dow Adolph N, DO   0.5 mg at 02/12/22 0240   LORazepam (ATIVAN) tablet 1-4 mg  1-4 mg Oral Q1H PRN Dow Adolph N, DO   2 mg at 02/12/22 9735   Or   LORazepam (ATIVAN) injection 1-4 mg  1-4 mg Intravenous Q1H PRN Dow Adolph N, DO   1 mg at 02/12/22 0039   melatonin tablet 5 mg  5 mg Oral QHS PRN Darlin Drop, DO       multivitamin with minerals tablet 1 tablet  1 tablet Oral Daily Dow Adolph N, DO   1 tablet at 02/12/22 0815   octreotide (SANDOSTATIN) 500 mcg in sodium chloride 0.9 % 250 mL (2 mcg/mL) infusion  50 mcg/hr Intravenous Continuous Pricilla Loveless, MD 25 mL/hr at 02/12/22 0644 50 mcg/hr at 02/12/22 [phone]  oxyCODONE (Oxy IR/ROXICODONE) immediate release tablet 5 mg  5 mg Oral Q6H PRN Darlin Drop, DO       [START ON 02/15/2022] pantoprazole (PROTONIX) injection 40 mg  40 mg Intravenous Q12H Hall, Carole N, DO       pantoprozole (PROTONIX) 80 mg /NS 100 mL infusion  8 mg/hr Intravenous Continuous Darlin Drop, DO 10 mL/hr at 02/12/22 0344 8 mg/hr at 02/12/22 0344   prochlorperazine (COMPAZINE) injection 10 mg  10 mg Intravenous Q6H PRN Dow Adolph N, DO       thiamine (VITAMIN B1) tablet 100 mg  100 mg Oral Daily Hall, Carole N, DO       Or   thiamine (VITAMIN B1) injection 100 mg  100 mg Intravenous Daily Hall, Carole N, DO   100 mg at  02/12/22 0820    Allergies as of 02/11/2022 - Review Complete 02/11/2022  Allergen Reaction Noted   Bee venom Anaphylaxis 03/09/2016   Codeine Nausea Only 03/07/2012    Family History  Problem Relation Age of Onset   Cancer Mother     Social History   Socioeconomic History   Marital status: Single    Spouse name: Not on file   Number of children: Not on file   Years of education: Not on file   Highest education level: Not on file  Occupational History   Not on file  Tobacco Use   Smoking status: Former    Packs/day: 1.50    Types: Cigarettes    Start date: 09/09/1976    Quit date: 12/18/2021    Years since quitting: 0.1    Passive exposure: Never   Smokeless tobacco: Never  Substance and Sexual Activity   Alcohol use: Yes    Comment: 40 oz of beer a day.    Drug use: Yes    Types: Marijuana   Sexual activity: Not Currently  Other Topics Concern   Not on file  Social History Narrative   Not on file   Social Determinants of Health   Financial Resource Strain: Not on file  Food Insecurity: Not on file  Transportation Needs: Not on file  Physical Activity: Not on file  Stress: Not on file  Social Connections: Not on file  Intimate Partner Violence: Not on file    Review of Systems: Positive for: GI: Described in detail in HPI.    Gen: Denies any fever, chills, rigors, night sweats, anorexia, fatigue, weakness, malaise, involuntary weight loss, and sleep disorder CV: Denies chest pain, angina, palpitations, syncope, orthopnea, PND, peripheral edema, and claudication. Resp: Denies dyspnea, cough, sputum, wheezing, coughing up blood. GU : Denies urinary burning, blood in urine, urinary frequency, urinary hesitancy, nocturnal urination, and urinary incontinence. MS: Back and leg pain Derm: Denies rash, itching, oral ulcerations, hives, unhealing ulcers.  Psych: Denies depression, anxiety, memory loss, suicidal ideation, hallucinations,  and confusion. Heme: Denies  bruising, bleeding, and enlarged lymph nodes. Neuro: Loss of consciousness Endo:  Denies any problems with DM, thyroid, adrenal function.  Physical Exam: Vital signs in last 24 hours: Temp:  [97.6 F (36.4 C)-98.3 F (36.8 C)] 97.6 F (36.4 C) (07/29 0345) Pulse Rate:  [75-109] 94 (07/29 0800) Resp:  [12-26] 13 (07/29 0641) BP: (84-151)/(57-91) 151/71 (07/29 0800) SpO2:  [94 %-100 %] 94 % (07/29 0641) Weight:  [62.6 kg] 62.6 kg (07/28 1914)    General:   Alert,  Well-developed, well-nourished, pleasant and cooperative in NAD Head:  Normocephalic and atraumatic. Eyes:  Sclera  clear, no icterus.   Prominent pallor  Ears:  Normal auditory acuity. Nose:  No deformity, discharge,  or lesions. Mouth:  No deformity or lesions.  Oropharynx pink & moist. Neck:  Supple; no masses or thyromegaly. Lungs:  Clear throughout to auscultation.   No wheezes, crackles, or rhonchi. No acute distress. Heart:  Regular rate and rhythm; no murmurs, clicks, rubs,  or gallops. Extremities:  Without clubbing or edema. Neurologic:  Alert and  oriented x4;  grossly normal neurologically. Skin:  Intact without significant lesions or rashes. Psych:  Alert and cooperative. Normal mood and affect. Abdomen:  Soft, nontender and nondistended. No masses, hepatosplenomegaly or hernias noted. Normal bowel sounds, without guarding, and without rebound.         Lab Results: Recent Labs    02/11/22 1845 02/11/22 1850 02/11/22 2130 02/12/22 0708  WBC 11.9*  --   --  9.2  HGB 6.6* 7.1* 6.5* 9.3*  HCT 19.4* 21.0* 19.3* 26.6*  PLT 108*  --   --  84*   BMET Recent Labs    02/11/22 1845 02/11/22 1850 02/12/22 0708  NA 131* 131* 133*  K 3.9 4.0 4.5  CL 102 98 103  CO2 20*  --  24  GLUCOSE 104* 97 140*  BUN 27* 29* 22*  CREATININE 0.92 0.90 0.86  CALCIUM 8.8*  --  8.2*   LFT Recent Labs    02/11/22 1845 02/12/22 0708  PROT 5.6* 5.3*  ALBUMIN 3.1* 3.0*  AST 40 32  ALT 19 20  ALKPHOS 17* 16*   BILITOT 0.6 0.6  BILIDIR <0.1  --   IBILI NOT CALCULATED  --    PT/INR Recent Labs    02/11/22 2130  LABPROT 14.2  INR 1.1    Studies/Results: CT Angio Chest/Abd/Pel for Dissection W and/or Wo Contrast  Result Date: 02/11/2022 CLINICAL DATA:  Severe chest pain, shortness of breath EXAM: CT ANGIOGRAPHY CHEST, ABDOMEN AND PELVIS TECHNIQUE: Non-contrast CT of the chest was initially obtained. Multidetector CT imaging through the chest, abdomen and pelvis was performed using the standard protocol during bolus administration of intravenous contrast. Multiplanar reconstructed images and MIPs were obtained and reviewed to evaluate the vascular anatomy. RADIATION DOSE REDUCTION: This exam was performed according to the departmental dose-optimization program which includes automated exposure control, adjustment of the mA and/or kV according to patient size and/or use of iterative reconstruction technique. CONTRAST:  OMNIPAQUE IOHEXOL 350 MG/ML SOLN COMPARISON:  CTA chest dated 07/16/2012 FINDINGS: CTA CHEST FINDINGS Cardiovascular: On unenhanced CT, there is no evidence intramural hematoma. Following contrast administration, there is no evidence of thoracic aortic aneurysm or dissection. Atherosclerotic calcifications of the arch. Although not tailored for evaluation, there is no evidence of pulmonary embolism to the segmental level. The heart is normal in size.  No pericardial effusion. Coronary atherosclerosis of the LAD and right coronary artery. Mediastinum/Nodes: No suspicious mediastinal lymphadenopathy. Mild wall thickening of the distal esophagus (series 5/image 125), nonspecific but suggesting reflux esophagitis. No associated pneumomediastinum. Visualized thyroid is unremarkable. Lungs/Pleura: Mild centrilobular emphysematous changes, upper lung predominant. No focal consolidation. No suspicious pulmonary nodules. No pleural effusion or pneumothorax. Musculoskeletal: No fracture is seen.  Bilateral ribs are intact. Mild degenerative changes of the lower thoracic spine. Review of the MIP images confirms the above findings. CTA ABDOMEN AND PELVIS FINDINGS VASCULAR Aorta: No evidence abdominal aortic aneurysm or dissection. Patent. Atherosclerotic calcifications. Celiac: Patent.  Atherosclerotic calcifications at the origin. SMA: Patent.  Atherosclerotic calcifications at the origin.  Renals: Patent. Proximal atherosclerotic calcifications bilaterally. IMA: Patent. Inflow: Patent bilaterally.  Atherosclerotic calcifications. Veins: Unremarkable. Review of the MIP images confirms the above findings. NON-VASCULAR Hepatobiliary: Lobulated but non masslike contour anteriorly along segment 4 (series 5/image 142), favored to reflect unusual orientation of normal hepatic parenchyma when correlating with sagittal images (sagittal image 72). Gallbladder is unremarkable. No intrahepatic or extrahepatic ductal dilatation. Pancreas: Within normal limits. No peripancreatic fluid/inflammatory changes. Spleen: Within normal limits. Adrenals/Urinary Tract: Adrenal glands are within normal limits. Left kidney is within normal limits. 15 mm left lower pole renal cyst (series 5/image 198), benign (Bosniak I). No follow-up is recommended. 3 mm nonobstructing interpolar right renal calculus (series 5/image 194). Additional 7 mm nonobstructing right lower pole renal calculus (series 5/image 206). No hydronephrosis. Mildly thick-walled bladder, although underdistended. Stomach/Bowel: Stomach is within normal limits. No evidence of bowel obstruction. Normal appendix (series 5/image 250). No colonic wall thickening or inflammatory changes. Lymphatic: No suspicious abdominopelvic lymphadenopathy. Reproductive: Prostate is unremarkable. Other: No abdominopelvic ascites. No free air. Musculoskeletal: Degenerative changes of the lumbar spine, most prominent at L5-S1. Review of the MIP images confirms the above findings. IMPRESSION:  No evidence of thoracoabdominal aortic aneurysm or dissection. No evidence of pulmonary embolism. Mild wall thickening of the distal esophagus, nonspecific but suggesting reflux esophagitis. No pneumomediastinum. Nonobstructing right renal calculi.  No hydronephrosis. Electronically Signed   By: Charline Bills M.D.   On: 02/11/2022 19:32   DG Chest Portable 1 View  Result Date: 02/11/2022 CLINICAL DATA:  Chest pain EXAM: PORTABLE CHEST 1 VIEW COMPARISON:  02/04/2021 FINDINGS: The heart size and mediastinal contours are within normal limits. Both lungs are clear. The visualized skeletal structures are unremarkable. IMPRESSION: No active disease. Electronically Signed   By: Ernie Avena M.D.   On: 02/11/2022 19:08    Impression: Melena, anemia, alcohol and NSAID use  History of cirrhosis-ongoing alcohol use, chronic hepatitis C, under care of [facility], was on Epclusa for treatment  MELDNa is 13(sodium 133, creatinine 0.86, total bilirubin 0.6, INR 1.1)  Recent EGD 4/23 did not show varices History of H. pylori associated gastric ulcer, treatment was given in May Thrombocytopenia, platelet 84 Elevated BUN of 22, normal creatinine  Plan: EGD today Hemoglobin improved from 6.5-9.3 after 2 units PRBC transfusion. The risks and the benefits of the procedure were discussed with the patient in details. He understands and verbalizes consent.   LOS: 1 day   Kerin Salen, MD  02/12/2022, 9:56 AM

## 2022-02-12 NOTE — Progress Notes (Signed)
PROGRESS NOTE    Christopher Logan  CXK:[id number] DOB: 1961-03-31 DOA: 02/11/2022 PCP: [name], MD   Chief Complaint  Patient presents with   Chest Pain   Hypotension    Brief Narrative:     Christopher Logan is a 61 y.o. male with medical history significant for hypertension, former tobacco user with 50+ pack year smoking history, ongoing alcohol abuse, GERD, treated hepatitis C with cirrhosis of liver, portal hypertension, chronic back pain and bilateral lower extremity numbness (referred to neurosurgery), who presented to [facility] ED via EMS due to severe chest pain and back pain.  He took a sublingual nitroglycerin at home, felt dizzy, and nearly passed out.  Denies loss of consciousness.  EMS was activated.  Upon EMS arrival, the patient was hypotensive with BP 70/40, improved with 500 cc normal saline bolus.  He received a full dose aspirin in route 324 mg x 1 for chest pain.     Endorses daily excessive alcohol use, liquor.  Last use of alcohol was the morning of his presentation.  Admits to frequent use of Goody powder for his back pain.  He noted black tarry stools that started today.  Denies hematemesis.  Admits to emesis every other day for the past 2 weeks.   In the ED, the patient is noted to have melena from bowel incontinence, with positive FOBT.  Presenting hemoglobin 6.6 K.  Dissection study negative for aortic dissection or aortic aneurysm however revealed evidence of reflux esophagitis.  EDP discussed the case with [facility] Dr. Marca Ancona.  2 units PRBCs ordered to be transfused.  The patient was started on octreotide drip and Protonix drip.  Rocephin added prophylactically due to concern for upper GI bleed in the setting of portal hypertension and liver cirrhosis.  EDP requested admission.  The patient was admitted by [facility], hospitalist service.  At the time of this visit, his chest pain had resolved.   ED Course: Tmax 97.9.  BP 119/77, pulse 102, respiration rate 22, O2 saturation  100% on room air.  Lab studies remarkable for serum sodium 131, serum bicarb 20, BUN 27, creatinine 0.92, GFR greater than 60.  High-sensitivity troponin 13.  Lactic acid 3.1.  WBC 11.9, hemoglobin 6.6, MCV 99.  Platelet count 108.  Urine analysis and urine culture are pending.  Assessment & Plan:   Principal Problem:   GI bleed Active Problems:   Alcohol abuse   Acute blood loss anemia   Upper GI bleed Gastric ulcer Duodenal ulcer Acute blood loss anemia -Presented with hemoglobin of 6.6, melena, transfused units PRBC with good response, continue to monitor CBC closely. -Concern for esophageal varices given liver cirrhosis alcohol abuse, no evidence of varices on endoscopy will DC retied -Continue with Protonix drip for next 24 hours. -Continue with clear liquid diet, advance as tolerated -Endoscopy by [facility] significant for normal esophagus, portal hypertensive gastropathy, nonbleeding gastric ulcer with clean ulcer base, nonbleeding with an ulcer with clean ulcer base.    Elevated troponin suspect demand ischemia in the setting of acute blood loss anemia Initial troponin 13, up-trended 41 At the time of this visit his chest pain had resolved.    Liver cirrhosis with portal hypertension Hepatitis C, with prior treatment. Alcohol abuse Avoid hepatotoxic agents. Alcohol cessation counseling at bedside Monitor LFTs. Obtain CMP in the morning   Euvolemic hyponatremia Serum sodium 131 Repeat chemistry panel in the morning   Lactic acidosis in the setting of upper GI bleed Lactic acid  3.1 on presentation Received 500 cc normal saline bolus via EMS IV albumin 12.5 g every 6 hours x3 doses Repeat lactic acid level   Mild non-anion gap metabolic acidosis in the setting of lactic acidosis Serum bicarb 20, lactic acid 3.1 Treat underlying conditions Repeat CMP in the morning.   Chronic thrombocytopenia in the setting of liver cirrhosis Platelet count 108 Monitor for now    Essential hypertension, BPs are soft Hold off home oral antihypertensives. Closely monitor vital signs Maintain MAP greater than 65 in the setting of upper GI bleed   Chronic back pain and lower extremity numbness Has been referred to neurosurgery in the outpatient setting Continue supportive care.   Alcohol abuse Endorses daily excessive alcohol use Last use of alcohol was the morning of his presentation Alcohol cessation counseling done at bedside.  The patient is receptive. CIWA protocol TOC consulted to assist with providing resources for alcohol cessation     DVT prophylaxis: SCD Code Status: Full Family Communication: None at bedside Disposition:   Status is: Inpatient    Consultants:  [facility]   Subjective:  No nausea, no vomiting, denies any chest pain or dyspnea currently  Objective: Vitals:   02/12/22 1036 02/12/22 1147 02/12/22 1200 02/12/22 1239  BP: (!) 159/90 (!) 150/91 (!) 160/92 (!) 147/88  Pulse: 76 82 70 80  Resp: 14 16 13 18   Temp: 98.6 F (37 C) 97.9 F (36.6 C)  97.8 F (36.6 C)  TempSrc: Temporal Axillary  Oral  SpO2: 96% 97% 100% 99%  Weight: 62.5 kg     Height: 5\' 11"  (1.803 m)       Intake/Output Summary (Last 24 hours) at 02/12/2022 1511 Last data filed at 02/12/2022 0345 Gross per 24 hour  Intake 2075.5 ml  Output 700 ml  Net 1375.5 ml   Filed Weights   02/11/22 1914 02/12/22 1036  Weight: 62.6 kg 62.5 kg    Examination:  General exam: Appears calm and comfortable  Respiratory system: Clear to auscultation. Respiratory effort normal. Cardiovascular system: S1 & S2 heard, RRR. No JVD, murmurs, rubs, gallops or clicks. No pedal edema. Gastrointestinal system: Abdomen is nondistended, soft and nontender. No organomegaly or masses felt. Normal bowel sounds heard. Central nervous system: Alert and oriented. No focal neurological deficits. Extremities: Symmetric 5 x 5 power. Skin: No rashes, lesions or ulcers Psychiatry:  Judgement and insight appear normal. Mood & affect appropriate.     Data Reviewed: I have personally reviewed following labs and imaging studies  CBC: Recent Labs  Lab 02/11/22 1845 02/11/22 1850 02/11/22 2130 02/12/22 0708 02/12/22 1221  WBC 11.9*  --   --  9.2 8.3  NEUTROABS  --   --   --  6.5  --   HGB 6.6* 7.1* 6.5* 9.3* 10.1*  HCT 19.4* 21.0* 19.3* 26.6* 28.5*  MCV 99.5  --   --  94.0 93.8  PLT 108*  --   --  84* 84*    Basic Metabolic Panel: Recent Labs  Lab 02/11/22 1845 02/11/22 1850 02/12/22 0708  NA 131* 131* 133*  K 3.9 4.0 4.5  CL 102 98 103  CO2 20*  --  24  GLUCOSE 104* 97 140*  BUN 27* 29* 22*  CREATININE 0.92 0.90 0.86  CALCIUM 8.8*  --  8.2*  MG  --   --  1.6*  PHOS  --   --  4.0    GFR: Estimated Creatinine Clearance: 80.7 mL/min (by C-G formula based  on SCr of 0.86 mg/dL).  Liver Function Tests: Recent Labs  Lab 02/11/22 1845 02/12/22 0708  AST 40 32  ALT 19 20  ALKPHOS 17* 16*  BILITOT 0.6 0.6  PROT 5.6* 5.3*  ALBUMIN 3.1* 3.0*    CBG: No results for input(s): "GLUCAP" in the last 168 hours.   No results found for this or any previous visit (from the past 240 hour(s)).       Radiology Studies: CT Angio Chest/Abd/Pel for Dissection W and/or Wo Contrast  Result Date: 02/11/2022 CLINICAL DATA:  Severe chest pain, shortness of breath EXAM: CT ANGIOGRAPHY CHEST, ABDOMEN AND PELVIS TECHNIQUE: Non-contrast CT of the chest was initially obtained. Multidetector CT imaging through the chest, abdomen and pelvis was performed using the standard protocol during bolus administration of intravenous contrast. Multiplanar reconstructed images and MIPs were obtained and reviewed to evaluate the vascular anatomy. RADIATION DOSE REDUCTION: This exam was performed according to the departmental dose-optimization program which includes automated exposure control, adjustment of the mA and/or kV according to patient size and/or use of iterative  reconstruction technique. CONTRAST:  OMNIPAQUE IOHEXOL 350 MG/ML SOLN COMPARISON:  CTA chest dated 07/16/2012 FINDINGS: CTA CHEST FINDINGS Cardiovascular: On unenhanced CT, there is no evidence intramural hematoma. Following contrast administration, there is no evidence of thoracic aortic aneurysm or dissection. Atherosclerotic calcifications of the arch. Although not tailored for evaluation, there is no evidence of pulmonary embolism to the segmental level. The heart is normal in size.  No pericardial effusion. Coronary atherosclerosis of the LAD and right coronary artery. Mediastinum/Nodes: No suspicious mediastinal lymphadenopathy. Mild wall thickening of the distal esophagus (series 5/image 125), nonspecific but suggesting reflux esophagitis. No associated pneumomediastinum. Visualized thyroid is unremarkable. Lungs/Pleura: Mild centrilobular emphysematous changes, upper lung predominant. No focal consolidation. No suspicious pulmonary nodules. No pleural effusion or pneumothorax. Musculoskeletal: No fracture is seen. Bilateral ribs are intact. Mild degenerative changes of the lower thoracic spine. Review of the MIP images confirms the above findings. CTA ABDOMEN AND PELVIS FINDINGS VASCULAR Aorta: No evidence abdominal aortic aneurysm or dissection. Patent. Atherosclerotic calcifications. Celiac: Patent.  Atherosclerotic calcifications at the origin. SMA: Patent.  Atherosclerotic calcifications at the origin. Renals: Patent. Proximal atherosclerotic calcifications bilaterally. IMA: Patent. Inflow: Patent bilaterally.  Atherosclerotic calcifications. Veins: Unremarkable. Review of the MIP images confirms the above findings. NON-VASCULAR Hepatobiliary: Lobulated but non masslike contour anteriorly along segment 4 (series 5/image 142), favored to reflect unusual orientation of normal hepatic parenchyma when correlating with sagittal images (sagittal image 72). Gallbladder is unremarkable. No intrahepatic or  extrahepatic ductal dilatation. Pancreas: Within normal limits. No peripancreatic fluid/inflammatory changes. Spleen: Within normal limits. Adrenals/Urinary Tract: Adrenal glands are within normal limits. Left kidney is within normal limits. 15 mm left lower pole renal cyst (series 5/image 198), benign (Bosniak I). No follow-up is recommended. 3 mm nonobstructing interpolar right renal calculus (series 5/image 194). Additional 7 mm nonobstructing right lower pole renal calculus (series 5/image 206). No hydronephrosis. Mildly thick-walled bladder, although underdistended. Stomach/Bowel: Stomach is within normal limits. No evidence of bowel obstruction. Normal appendix (series 5/image 250). No colonic wall thickening or inflammatory changes. Lymphatic: No suspicious abdominopelvic lymphadenopathy. Reproductive: Prostate is unremarkable. Other: No abdominopelvic ascites. No free air. Musculoskeletal: Degenerative changes of the lumbar spine, most prominent at L5-S1. Review of the MIP images confirms the above findings. IMPRESSION: No evidence of thoracoabdominal aortic aneurysm or dissection. No evidence of pulmonary embolism. Mild wall thickening of the distal esophagus, nonspecific but suggesting reflux esophagitis. No pneumomediastinum. Nonobstructing  right renal calculi.  No hydronephrosis. Electronically Signed   By: Charline Bills M.D.   On: 02/11/2022 19:32   DG Chest Portable 1 View  Result Date: 02/11/2022 CLINICAL DATA:  Chest pain EXAM: PORTABLE CHEST 1 VIEW COMPARISON:  02/04/2021 FINDINGS: The heart size and mediastinal contours are within normal limits. Both lungs are clear. The visualized skeletal structures are unremarkable. IMPRESSION: No active disease. Electronically Signed   By: Ernie Avena M.D.   On: 02/11/2022 19:08        Scheduled Meds:  folic acid  1 mg Oral Daily   multivitamin with minerals  1 tablet Oral Daily   [START ON 02/15/2022] pantoprazole  40 mg Intravenous Q12H    thiamine  100 mg Oral Daily   Or   thiamine  100 mg Intravenous Daily   Continuous Infusions:  cefTRIAXone (ROCEPHIN)  IV 2 g (02/12/22 0049)   pantoprazole 8 mg/hr (02/12/22 1245)     LOS: 1 day       Huey Bienenstock, MD [facility]   To contact the attending provider between 7A-7P or the covering provider during after hours 7P-7A, please log into the web site [email or web address] and access using universal [place] password for that web site. If you do not have the password, please call the hospital operator.  02/12/2022, 3:11 PM

## 2022-02-12 NOTE — Anesthesia Procedure Notes (Addendum)
Procedure Name: MAC Date/Time: 02/12/2022 11:32 AM  Performed by: Colin Benton, CRNAPre-anesthesia Checklist: Patient identified, Emergency Drugs available, Suction available and Patient being monitored Patient Re-evaluated:Patient Re-evaluated prior to induction Oxygen Delivery Method: Nasal cannula Induction Type: IV induction Airway Equipment and Method: Bite block Placement Confirmation: positive ETCO2 Dental Injury: Teeth and Oropharynx as per pre-operative assessment

## 2022-02-12 NOTE — Anesthesia Preprocedure Evaluation (Signed)
Anesthesia Evaluation  Patient identified by MRN, date of birth, ID band Patient awake    Reviewed: Allergy & Precautions, NPO status , Patient's Chart, lab work & pertinent test results  Airway Mallampati: II  TM Distance: >3 FB Neck ROM: Full    Dental  (+) Missing   Pulmonary former smoker,    Pulmonary exam normal        Cardiovascular hypertension, Pt. on medications Normal cardiovascular exam     Neuro/Psych negative neurological ROS  negative psych ROS   GI/Hepatic negative GI ROS, (+)     substance abuse  ,   Endo/Other  negative endocrine ROS  Renal/GU negative Renal ROS     Musculoskeletal negative musculoskeletal ROS (+)   Abdominal   Peds  Hematology  (+) Blood dyscrasia, anemia , Thrombocytopenia    Anesthesia Other Findings gi bleed  Reproductive/Obstetrics                             Anesthesia Physical Anesthesia Plan  ASA: 3  Anesthesia Plan: MAC   Post-op Pain Management:    Induction: Intravenous  PONV Risk Score and Plan: 1 and Propofol infusion and Treatment may vary due to age or medical condition  Airway Management Planned: Nasal Cannula  Additional Equipment:   Intra-op Plan:   Post-operative Plan:   Informed Consent: I have reviewed the patients History and Physical, chart, labs and discussed the procedure including the risks, benefits and alternatives for the proposed anesthesia with the patient or authorized representative who has indicated his/her understanding and acceptance.     Dental advisory given  Plan Discussed with: CRNA  Anesthesia Plan Comments:         Anesthesia Quick Evaluation

## 2022-02-12 NOTE — Op Note (Signed)
[facility] Patient Name: Christopher Logan Procedure Date : 02/12/2022 MRN: [id number] Attending MD: Kerin Salen , MD Date of Birth: 1960/10/07 CSN: [id number] Age: 61 Admit Type: Inpatient Procedure:                Upper GI endoscopy Indications:              Acute post hemorrhagic anemia, Melena Providers:                Kerin Salen, MD, Margaree Mackintosh, RN, Salley Scarlet, Technician Referring MD:             [facility] Medicines:                Monitored Anesthesia Care Complications:            No immediate complications. Estimated Blood Loss:     Estimated blood loss: none. Procedure:                Pre-Anesthesia Assessment:                           - Prior to the procedure, a History and Physical                            was performed, and patient medications and                            allergies were reviewed. The patient's tolerance of                            previous anesthesia was also reviewed. The risks                            and benefits of the procedure and the sedation                            options and risks were discussed with the patient.                            All questions were answered, and informed consent                            was obtained. Prior Anticoagulants: The patient has                            taken no previous anticoagulant or antiplatelet                            agents. ASA Grade Assessment: III - A patient with                            severe systemic disease. After reviewing the risks  and benefits, the patient was deemed in                            satisfactory condition to undergo the procedure.                           After obtaining informed consent, the endoscope was                            passed under direct vision. Throughout the                            procedure, the patient's blood pressure, pulse, and                             oxygen saturations were monitored continuously. The                            [id number]-[id number] ([id number]) Olympus endoscope was introduced                            through the mouth, and advanced to the second part                            of duodenum. The upper GI endoscopy was                            accomplished without difficulty. The patient                            tolerated the procedure well. Scope In: Scope Out: Findings:      The examined esophagus was normal.      A medium amount of food (residue) was found in the gastric body.      Mild portal hypertensive gastropathy was found in the stomach.      Prominent bulge noted in the fundus on retroflexion, ?thickened folds vs       gastric varix.      One non-bleeding cratered gastric ulcer with a clean ulcer base (Forrest       Class III) was found in the gastric antrum. The lesion was 8 mm in       largest dimension. Biopsies were taken with a cold forceps for       Helicobacter pylori testing.      One non-bleeding cratered duodenal ulcer with a clean ulcer base       (Forrest Class III) was found in the first portion of the duodenum. The       lesion was 12 mm in largest dimension. Impression:               - Normal esophagus.                           - A medium amount of food (residue) in the stomach.                           - Portal  hypertensive gastropathy.                           - Non-bleeding gastric ulcer with a clean ulcer                            base (Forrest Class III). Biopsied.                           - Non-bleeding duodenal ulcer with a clean ulcer                            base (Forrest Class III). Moderate Sedation:      Patient did not receive moderate sedation for this procedure, but       instead received monitored anesthesia care. Recommendation:           - Full liquid diet.                           - Continue present medications. Continue protonix                            drip  8mg /hr IV for another 24 hours.                           - Await pathology results.                           - D/C octreotide drip. Procedure Code(s):        --- Professional ---                           [phone], Esophagogastroduodenoscopy, flexible,                            transoral; with biopsy, single or multiple Diagnosis Code(s):        --- Professional ---                           K76.6, Portal hypertension                           K31.89, Other diseases of stomach and duodenum                           K25.9, Gastric ulcer, unspecified as acute or                            chronic, without hemorrhage or perforation                           K26.9, Duodenal ulcer, unspecified as acute or                            chronic, without hemorrhage or perforation  D62, Acute posthemorrhagic anemia                           K92.1, Melena (includes Hematochezia) CPT copyright 2019 American Medical Association. All rights reserved. The codes documented in this report are preliminary and upon coder review may  be revised to meet current compliance requirements. Kerin Salen, MD 02/12/2022 11:53:18 AM This report has been signed electronically. Number of Addenda: 0

## 2022-02-12 NOTE — Transfer of Care (Signed)
Immediate Anesthesia Transfer of Care Note  Patient: Christopher Logan  Procedure(s) Performed: ESOPHAGOGASTRODUODENOSCOPY (EGD) WITH PROPOFOL BIOPSY  Patient Location: PACU  Anesthesia Type:MAC  Level of Consciousness: drowsy  Airway & Oxygen Therapy: Patient Spontanous Breathing and Patient connected to nasal cannula oxygen  Post-op Assessment: Report given to RN and Post -op Vital signs reviewed and stable  Post vital signs: Reviewed and stable  Last Vitals:  Vitals Value Taken Time  BP 150/91 02/12/22 1146  Temp    Pulse 75 02/12/22 1148  Resp 14 02/12/22 1148  SpO2 100 % 02/12/22 1148  Vitals shown include unvalidated device data.  Last Pain:  Vitals:   02/12/22 1036  TempSrc: Temporal  PainSc: Asleep         Complications: No notable events documented.

## 2022-02-12 NOTE — Plan of Care (Signed)
  Problem: Health Behavior/Discharge Planning: Goal: Ability to manage health-related needs will improve Outcome: Progressing   

## 2022-02-12 NOTE — Anesthesia Postprocedure Evaluation (Signed)
Anesthesia Post Note  Patient: Christopher Logan  Procedure(s) Performed: ESOPHAGOGASTRODUODENOSCOPY (EGD) WITH PROPOFOL BIOPSY     Patient location during evaluation: PACU Anesthesia Type: MAC Level of consciousness: awake Pain management: pain level controlled Vital Signs Assessment: post-procedure vital signs reviewed and stable Respiratory status: spontaneous breathing, nonlabored ventilation, respiratory function stable and patient connected to nasal cannula oxygen Cardiovascular status: stable and blood pressure returned to baseline Postop Assessment: no apparent nausea or vomiting Anesthetic complications: no   No notable events documented.  Last Vitals:  Vitals:   02/12/22 1239 02/12/22 1933  BP: (!) 147/88 (!) 157/93  Pulse: 80 88  Resp: 18 18  Temp: 36.6 C 36.7 C  SpO2: 99% 99%    Last Pain:  Vitals:   02/12/22 2045  TempSrc:   PainSc: 7                  Christopher Logan

## 2022-02-13 ENCOUNTER — Encounter (HOSPITAL_COMMUNITY): Payer: Self-pay | Admitting: Gastroenterology

## 2022-02-13 DIAGNOSIS — D62 Acute posthemorrhagic anemia: Secondary | ICD-10-CM | POA: Diagnosis not present

## 2022-02-13 DIAGNOSIS — K922 Gastrointestinal hemorrhage, unspecified: Secondary | ICD-10-CM | POA: Diagnosis not present

## 2022-02-13 DIAGNOSIS — F1010 Alcohol abuse, uncomplicated: Secondary | ICD-10-CM | POA: Diagnosis not present

## 2022-02-13 LAB — BPAM RBC
Blood Product Expiration Date: 202308222359
Blood Product Expiration Date: 202308222359
ISSUE DATE / TIME: 202307282121
ISSUE DATE / TIME: 202307290115
Unit Type and Rh: 6200
Unit Type and Rh: 6200

## 2022-02-13 LAB — TYPE AND SCREEN
ABO/RH(D): A POS
Antibody Screen: NEGATIVE
Unit division: 0
Unit division: 0

## 2022-02-13 LAB — BASIC METABOLIC PANEL
Anion gap: 7 (ref 5–15)
BUN: 10 mg/dL (ref 6–20)
CO2: 26 mmol/L (ref 22–32)
Calcium: 8.9 mg/dL (ref 8.9–10.3)
Chloride: 94 mmol/L — ABNORMAL LOW (ref 98–111)
Creatinine, Ser: 0.79 mg/dL (ref 0.61–1.24)
GFR, Estimated: >60 mL/min (ref >60)
Glucose, Bld: 104 mg/dL — ABNORMAL HIGH (ref 70–99)
Potassium: 4.2 mmol/L (ref 3.5–5.1)
Sodium: 127 mmol/L — ABNORMAL LOW (ref 135–145)

## 2022-02-13 LAB — CBC
HCT: 29 % — ABNORMAL LOW (ref 39.0–52.0)
Hemoglobin: 10.1 g/dL — ABNORMAL LOW (ref 13.0–17.0)
MCH: 32.6 pg (ref 26.0–34.0)
MCHC: 34.8 g/dL (ref 30.0–36.0)
MCV: 93.5 fL (ref 80.0–100.0)
Platelets: 91 10*3/uL — ABNORMAL LOW (ref 150–400)
RBC: 3.1 MIL/uL — ABNORMAL LOW (ref 4.22–5.81)
RDW: 14.6 % (ref 11.5–15.5)
WBC: 10 10*3/uL (ref 4.0–10.5)
nRBC: 0.3 % — ABNORMAL HIGH (ref 0.0–0.2)

## 2022-02-13 LAB — URINE CULTURE

## 2022-02-13 MED ORDER — PANTOPRAZOLE SODIUM 40 MG IV SOLR: 40.0000 mg | Freq: Two times a day (BID) | INTRAVENOUS | Status: DC

## 2022-02-13 MED ORDER — PANTOPRAZOLE SODIUM 40 MG IV SOLR
40.0000 mg | Freq: Two times a day (BID) | INTRAVENOUS | Status: DC
  Administered 2022-02-13 – 2022-02-15 (×4): 40 mg via INTRAVENOUS
  Filled 2022-02-13 (×4): qty 10

## 2022-02-13 NOTE — Progress Notes (Signed)
PROGRESS NOTE    [name]  TFT:[id number] DOB: 1961/04/17 DOA: 02/11/2022 PCP: [name], MD   Chief Complaint  Patient presents with   Chest Pain   Hypotension    Brief Narrative:     Christopher Logan is a 61 y.o. male with medical history significant for hypertension, former tobacco user with 50+ pack year smoking history, ongoing alcohol abuse, GERD, treated hepatitis C with cirrhosis of liver, portal hypertension, chronic back pain and bilateral lower extremity numbness (referred to neurosurgery), who presented to [facility] ED via EMS due to severe chest pain and back pain.  He took a sublingual nitroglycerin at home, felt dizzy, and nearly passed out.  Denies loss of consciousness.  EMS was activated.  Upon EMS arrival, the patient was hypotensive with BP 70/40, improved with 500 cc normal saline bolus.  He received a full dose aspirin in route 324 mg x 1 for chest pain.     Endorses daily excessive alcohol use, liquor.  Last use of alcohol was the morning of his presentation.  Admits to frequent use of Goody powder for his back pain.  He noted black tarry stools that started today.  Denies hematemesis.  Admits to emesis every other day for the past 2 weeks.   In the ED, the patient is noted to have melena from bowel incontinence, with positive FOBT.  Presenting hemoglobin 6.6 K.  Dissection study negative for aortic dissection or aortic aneurysm however revealed evidence of reflux esophagitis.  EDP discussed the case with [facility] Dr. Marca Ancona.  2 units PRBCs ordered to be transfused.  The patient was started on octreotide drip and Protonix drip.  Rocephin added prophylactically due to concern for upper GI bleed in the setting of portal hypertension and liver cirrhosis.  EDP requested admission.  The patient was admitted by [facility], hospitalist service.  At the time of this visit, his chest pain had resolved.   Assessment & Plan:   Principal Problem:   GI bleed Active Problems:    Alcohol abuse   Acute blood loss anemia   Upper GI bleed Gastric ulcer Duodenal ulcer Acute blood loss anemia -Presented with hemoglobin of 6.6, melena, transfused units PRBC with good response, continue to monitor CBC closely. -Concern for esophageal varices given liver cirrhosis alcohol abuse, no evidence of varices on endoscopy , I have discontinued his Rocephin and octreotide drip. -Continue with Protonix drip , 24 hours after endoscopy per GI recommendations -Continue with clear liquid diet, advance as tolerated -Endoscopy by [facility] significant for normal esophagus, portal hypertensive gastropathy, nonbleeding gastric ulcer with clean ulcer base, nonbleeding with an ulcer with clean ulcer base. -Endorses using Goody's powder    Elevated troponin suspect demand ischemia in the setting of acute blood loss anemia Initial troponin 13, up-trended 41 At the time of this visit his chest pain had resolved.  Hyponatremia -Likely due to clear liquid diet.  Will put on fluid restrictions.    Liver cirrhosis with portal hypertension Hepatitis C, with prior treatment. Alcohol abuse Avoid hepatotoxic agents. Alcohol cessation counseling at bedside Monitor LFTs. Obtain CMP in the morning   Euvolemic hyponatremia Serum sodium 131 Repeat chemistry panel in the morning   Lactic acidosis in the setting of upper GI bleed Lactic acid 3.1 on presentation Received 500 cc normal saline bolus via EMS IV albumin 12.5 g every 6 hours x3 doses Repeat lactic acid level   Mild non-anion gap metabolic acidosis in the setting of lactic acidosis  Serum bicarb 20, lactic acid 3.1 Treat underlying conditions Repeat CMP in the morning.   Chronic thrombocytopenia in the setting of liver cirrhosis Platelet count 108 Monitor for now   Essential hypertension, BPs are soft Hold off home oral antihypertensives. Closely monitor vital signs Maintain MAP greater than 65 in the setting of upper GI  bleed   Chronic back pain and lower extremity numbness Has been referred to neurosurgery in the outpatient setting Continue supportive care.   Alcohol abuse Endorses daily excessive alcohol use Last use of alcohol was the morning of his presentation Alcohol cessation counseling done at bedside.  The patient is receptive. CIWA protocol TOC consulted to assist with providing resources for alcohol cessation     DVT prophylaxis: SCD Code Status: Full Family Communication: None at bedside Disposition:   Status is: Inpatient    Consultants:  [facility]   Subjective:  Nausea, no vomiting, no chest pain, asking if we can advance his diet  Objective: Vitals:   02/13/22 0248 02/13/22 0253 02/13/22 0400 02/13/22 0754  BP: (!) 152/103 (!) 153/81 134/81 (!) 157/90  Pulse: 88 88 86 91  Resp:   12 19  Temp:   98.6 F (37 C) 98.4 F (36.9 C)  TempSrc:   Axillary Oral  SpO2: 97%  97% 98%  Weight:      Height:        Intake/Output Summary (Last 24 hours) at 02/13/2022 1141 Last data filed at 02/13/2022 0801 Gross per 24 hour  Intake --  Output 2925 ml  Net -2925 ml   Filed Weights   02/11/22 1914 02/12/22 1036  Weight: 62.6 kg 62.5 kg    Examination:  Awake Alert, Oriented X 3, No new F.N deficits, Normal affect Symmetrical Chest wall movement, Good air movement bilaterally, CTAB RRR,No Gallops,Rubs or new Murmurs, No Parasternal Heave +ve B.Sounds, Abd Soft, No tenderness, No rebound - guarding or rigidity. No Cyanosis, Clubbing or edema, No new Rash or bruise      Data Reviewed: I have personally reviewed following labs and imaging studies  CBC: Recent Labs  Lab 02/11/22 1845 02/11/22 1850 02/11/22 2130 02/12/22 0708 02/12/22 1221 02/13/22 0423  WBC 11.9*  --   --  9.2 8.3 10.0  NEUTROABS  --   --   --  6.5  --   --   HGB 6.6* 7.1* 6.5* 9.3* 10.1* 10.1*  HCT 19.4* 21.0* 19.3* 26.6* 28.5* 29.0*  MCV 99.5  --   --  94.0 93.8 93.5  PLT 108*  --   --  84*  84* 91*    Basic Metabolic Panel: Recent Labs  Lab 02/11/22 1845 02/11/22 1850 02/12/22 0708 02/13/22 0423  NA 131* 131* 133* 127*  K 3.9 4.0 4.5 4.2  CL 102 98 103 94*  CO2 20*  --  24 26  GLUCOSE 104* 97 140* 104*  BUN 27* 29* 22* 10  CREATININE 0.92 0.90 0.86 0.79  CALCIUM 8.8*  --  8.2* 8.9  MG  --   --  1.6*  --   PHOS  --   --  4.0  --     GFR: Estimated Creatinine Clearance: 86.8 mL/min (by C-G formula based on SCr of 0.79 mg/dL).  Liver Function Tests: Recent Labs  Lab 02/11/22 1845 02/12/22 0708  AST 40 32  ALT 19 20  ALKPHOS 17* 16*  BILITOT 0.6 0.6  PROT 5.6* 5.3*  ALBUMIN 3.1* 3.0*    CBG: No results for  input(s): "GLUCAP" in the last 168 hours.   Recent Results (from the past 240 hour(s))  Urine Culture     Status: Abnormal   Collection Time: 02/11/22  9:27 PM   Specimen: Urine, Clean Catch  Result Value Ref Range Status   Specimen Description URINE, CLEAN CATCH  Final   Special Requests   Final    NONE Performed at [facility], [address], [place], [place] [address]    Culture MULTIPLE SPECIES PRESENT, SUGGEST RECOLLECTION (A)  Final   Report Status 02/13/2022 FINAL  Final         Radiology Studies: CT Angio Chest/Abd/Pel for Dissection W and/or Wo Contrast  Result Date: 02/11/2022 CLINICAL DATA:  Severe chest pain, shortness of breath EXAM: CT ANGIOGRAPHY CHEST, ABDOMEN AND PELVIS TECHNIQUE: Non-contrast CT of the chest was initially obtained. Multidetector CT imaging through the chest, abdomen and pelvis was performed using the standard protocol during bolus administration of intravenous contrast. Multiplanar reconstructed images and MIPs were obtained and reviewed to evaluate the vascular anatomy. RADIATION DOSE REDUCTION: This exam was performed according to the departmental dose-optimization program which includes automated exposure control, adjustment of the mA and/or kV according to patient size and/or use of iterative  reconstruction technique. CONTRAST:  OMNIPAQUE IOHEXOL 350 MG/ML SOLN COMPARISON:  CTA chest dated 07/16/2012 FINDINGS: CTA CHEST FINDINGS Cardiovascular: On unenhanced CT, there is no evidence intramural hematoma. Following contrast administration, there is no evidence of thoracic aortic aneurysm or dissection. Atherosclerotic calcifications of the arch. Although not tailored for evaluation, there is no evidence of pulmonary embolism to the segmental level. The heart is normal in size.  No pericardial effusion. Coronary atherosclerosis of the LAD and right coronary artery. Mediastinum/Nodes: No suspicious mediastinal lymphadenopathy. Mild wall thickening of the distal esophagus (series 5/image 125), nonspecific but suggesting reflux esophagitis. No associated pneumomediastinum. Visualized thyroid is unremarkable. Lungs/Pleura: Mild centrilobular emphysematous changes, upper lung predominant. No focal consolidation. No suspicious pulmonary nodules. No pleural effusion or pneumothorax. Musculoskeletal: No fracture is seen. Bilateral ribs are intact. Mild degenerative changes of the lower thoracic spine. Review of the MIP images confirms the above findings. CTA ABDOMEN AND PELVIS FINDINGS VASCULAR Aorta: No evidence abdominal aortic aneurysm or dissection. Patent. Atherosclerotic calcifications. Celiac: Patent.  Atherosclerotic calcifications at the origin. SMA: Patent.  Atherosclerotic calcifications at the origin. Renals: Patent. Proximal atherosclerotic calcifications bilaterally. IMA: Patent. Inflow: Patent bilaterally.  Atherosclerotic calcifications. Veins: Unremarkable. Review of the MIP images confirms the above findings. NON-VASCULAR Hepatobiliary: Lobulated but non masslike contour anteriorly along segment 4 (series 5/image 142), favored to reflect unusual orientation of normal hepatic parenchyma when correlating with sagittal images (sagittal image 72). Gallbladder is unremarkable. No intrahepatic or  extrahepatic ductal dilatation. Pancreas: Within normal limits. No peripancreatic fluid/inflammatory changes. Spleen: Within normal limits. Adrenals/Urinary Tract: Adrenal glands are within normal limits. Left kidney is within normal limits. 15 mm left lower pole renal cyst (series 5/image 198), benign (Bosniak I). No follow-up is recommended. 3 mm nonobstructing interpolar right renal calculus (series 5/image 194). Additional 7 mm nonobstructing right lower pole renal calculus (series 5/image 206). No hydronephrosis. Mildly thick-walled bladder, although underdistended. Stomach/Bowel: Stomach is within normal limits. No evidence of bowel obstruction. Normal appendix (series 5/image 250). No colonic wall thickening or inflammatory changes. Lymphatic: No suspicious abdominopelvic lymphadenopathy. Reproductive: Prostate is unremarkable. Other: No abdominopelvic ascites. No free air. Musculoskeletal: Degenerative changes of the lumbar spine, most prominent at L5-S1. Review of the MIP images confirms the above findings.  IMPRESSION: No evidence of thoracoabdominal aortic aneurysm or dissection. No evidence of pulmonary embolism. Mild wall thickening of the distal esophagus, nonspecific but suggesting reflux esophagitis. No pneumomediastinum. Nonobstructing right renal calculi.  No hydronephrosis. Electronically Signed   By: Charline Bills M.D.   On: 02/11/2022 19:32   DG Chest Portable 1 View  Result Date: 02/11/2022 CLINICAL DATA:  Chest pain EXAM: PORTABLE CHEST 1 VIEW COMPARISON:  02/04/2021 FINDINGS: The heart size and mediastinal contours are within normal limits. Both lungs are clear. The visualized skeletal structures are unremarkable. IMPRESSION: No active disease. Electronically Signed   By: Ernie Avena M.D.   On: 02/11/2022 19:08        Scheduled Meds:  folic acid  1 mg Oral Daily   multivitamin with minerals  1 tablet Oral Daily   pantoprazole  40 mg Intravenous Q12H   thiamine  100 mg  Oral Daily   Or   thiamine  100 mg Intravenous Daily   Continuous Infusions:  cefTRIAXone (ROCEPHIN)  IV 2 g (02/12/22 2048)   pantoprazole 8 mg/hr (02/13/22 0804)     LOS: 2 days       Huey Bienenstock, MD [facility]   To contact the attending provider between 7A-7P or the covering provider during after hours 7P-7A, please log into the web site [email or web address] and access using universal [place] password for that web site. If you do not have the password, please call the hospital operator.  02/13/2022, 11:41 AM

## 2022-02-13 NOTE — Progress Notes (Signed)
Subjective: Has not had a bowel movement since admission. Denies abdominal pain. Is requesting for his diet to be advanced.  Objective: Vital signs in last 24 hours: Temp:  [97.6 F (36.4 C)-98.6 F (37 C)] 97.6 F (36.4 C) (07/30 1144) Pulse Rate:  [70-96] 77 (07/30 1144) Resp:  [12-20] 17 (07/30 1144) BP: (128-162)/(81-103) 146/96 (07/30 1144) SpO2:  [95 %-100 %] 95 % (07/30 1144) Weight change: -0.096 kg Last BM Date : 02/11/22  PE: Appears comfortable, not in distress, no icterus GENERAL: Mild pallor ABDOMEN: Nondistended EXTREMITIES: No deformity  Lab Results: Results for orders placed or performed during the hospital encounter of 02/11/22 (from the past 48 hour(s))  Basic metabolic panel     Status: Abnormal   Collection Time: 02/11/22  6:45 PM  Result Value Ref Range   Sodium 131 (L) 135 - 145 mmol/L   Potassium 3.9 3.5 - 5.1 mmol/L   Chloride 102 98 - 111 mmol/L   CO2 20 (L) 22 - 32 mmol/L   Glucose, Bld 104 (H) 70 - 99 mg/dL    Comment: Glucose reference range applies only to samples taken after fasting for at least 8 hours.   BUN 27 (H) 6 - 20 mg/dL   Creatinine, Ser 4.31 0.61 - 1.24 mg/dL   Calcium 8.8 (L) 8.9 - 10.3 mg/dL   GFR, Estimated >54 >00 mL/min    Comment: (NOTE) Calculated using the CKD-EPI Creatinine Equation (2021)    Anion gap 9 5 - 15    Comment: Performed at [facility], [address], [place], [place] [address]  CBC     Status: Abnormal   Collection Time: 02/11/22  6:45 PM  Result Value Ref Range   WBC 11.9 (H) 4.0 - 10.5 K/uL   RBC 1.95 (L) 4.22 - 5.81 MIL/uL   Hemoglobin 6.6 (LL) 13.0 - 17.0 g/dL    Comment: REPEATED TO VERIFY THIS CRITICAL RESULT HAS VERIFIED AND BEEN CALLED TO Kathlyn Sacramento, RN BY DANIELLE LONG ON 07 28 2023 AT 1922, AND HAS BEEN READ BACK.     HCT 19.4 (L) 39.0 - 52.0 %   MCV 99.5 80.0 - 100.0 fL   MCH 33.8 26.0 - 34.0 pg   MCHC 34.0 30.0 - 36.0 g/dL   RDW 95.0 93.2 - 67.1 %   Platelets 108 (L) 150 - 400  K/uL    Comment: Immature Platelet Fraction may be clinically indicated, consider ordering this additional test [id number] REPEATED TO VERIFY    nRBC 0.2 0.0 - 0.2 %    Comment: Performed at [facility], [address], [place], [place] [address]  Troponin I (High Sensitivity)     Status: None   Collection Time: 02/11/22  6:45 PM  Result Value Ref Range   Troponin I (High Sensitivity) 13 <18 ng/L    Comment: (NOTE) Elevated high sensitivity troponin I (hsTnI) values and significant  changes across serial measurements may suggest ACS but many other  chronic and acute conditions are known to elevate hsTnI results.  Refer to the Links section for chest pain algorithms and additional  guidance. Performed at [facility], [address] [place], [place] [address]   Hepatic function panel     Status: Abnormal   Collection Time: 02/11/22  6:45 PM  Result Value Ref Range   Total Protein 5.6 (L) 6.5 - 8.1 g/dL   Albumin 3.1 (L) 3.5 - 5.0 g/dL   AST 40 15 - 41  U/L   ALT 19 0 - 44 U/L   Alkaline Phosphatase 17 (L) 38 - 126 U/L   Total Bilirubin 0.6 0.3 - 1.2 mg/dL   Bilirubin, Direct <0.1 0.0 - 0.2 mg/dL   Indirect Bilirubin NOT CALCULATED 0.3 - 0.9 mg/dL    Comment: Performed at [facility] [address], [place], [place] [address]  Lipase, blood     Status: None   Collection Time: 02/11/22  6:45 PM  Result Value Ref Range   Lipase 39 11 - 51 U/L    Comment: Performed at [place] [address] [place], [place] [address]  I-stat chem 8, ED (not at [facility] or [facility])     Status: Abnormal   Collection Time: 02/11/22  6:50 PM  Result Value Ref Range   Sodium 131 (L) 135 - 145 mmol/L   Potassium 4.0 3.5 - 5.1 mmol/L   Chloride 98 98 - 111 mmol/L   BUN 29 (H) 6 - 20 mg/dL   Creatinine, Ser 0.90 0.61 - 1.24 mg/dL   Glucose, Bld 97 70 - 99 mg/dL    Comment: Glucose reference range applies only to samples taken after fasting for at least 8 hours.   Calcium,  Ion 1.11 (L) 1.15 - 1.40 mmol/L   TCO2 21 (L) 22 - 32 mmol/L   Hemoglobin 7.1 (L) 13.0 - 17.0 g/dL   HCT 21.0 (L) 39.0 - 52.0 %  ABO/Rh     Status: None   Collection Time: 02/11/22  7:00 PM  Result Value Ref Range   ABO/RH(D)      A POS Performed at [facility] [address] [place], [place] [address]   Lactic acid, plasma     Status: Abnormal   Collection Time: 02/11/22  7:20 PM  Result Value Ref Range   Lactic Acid, Venous 3.1 (HH) 0.5 - 1.9 mmol/L    Comment: CRITICAL RESULT CALLED TO, READ BACK BY AND VERIFIED WITH E,SULLIVAN RN @2024  02/11/22 E,BENTON Performed at [facility] [address] [place], [place] [address]   Prepare RBC (crossmatch)     Status: None   Collection Time: 02/11/22  7:20 PM  Result Value Ref Range   Order Confirmation      ORDER PROCESSED BY BLOOD BANK Performed at [facility], [place] [address], [place], [place] [address]   Type and screen [place]     Status: None   Collection Time: 02/11/22  7:20 PM  Result Value Ref Range   ABO/RH(D) A POS    Antibody Screen NEG    Sample Expiration 02/14/2022,2359    Unit Number [id number]    Blood Component Type RED CELLS,LR    Unit division 00    Status of Unit ISSUED,FINAL    Transfusion Status OK TO TRANSFUSE    Crossmatch Result      Compatible Performed at [facility], [place] [address], [place], [place] [address]    Unit Number [id number]    Blood Component Type RED CELLS,LR    Unit division 00    Status of Unit ISSUED,FINAL    Transfusion Status OK TO TRANSFUSE    Crossmatch Result Compatible   POC occult blood, ED     Status: Abnormal   Collection Time: 02/11/22  7:39 PM  Result Value Ref Range   Fecal Occult Bld POSITIVE (A) NEGATIVE  Urinalysis, Routine w reflex microscopic Urine, Clean Catch  Status: Abnormal   Collection Time: 02/11/22  9:26 PM  Result Value Ref Range   Color, Urine STRAW (A) YELLOW   APPearance CLEAR CLEAR    Specific Gravity, Urine 1.021 1.005 - 1.030   pH 6.0 5.0 - 8.0   Glucose, UA NEGATIVE NEGATIVE mg/dL   Hgb urine dipstick NEGATIVE NEGATIVE   Bilirubin Urine NEGATIVE NEGATIVE   Ketones, ur NEGATIVE NEGATIVE mg/dL   Protein, ur NEGATIVE NEGATIVE mg/dL   Nitrite NEGATIVE NEGATIVE   Leukocytes,Ua NEGATIVE NEGATIVE    Comment: Performed at [facility], [address], [place], [place] [address]  Urine Culture     Status: Abnormal   Collection Time: 02/11/22  9:27 PM   Specimen: Urine, Clean Catch  Result Value Ref Range   Specimen Description URINE, CLEAN CATCH    Special Requests      NONE Performed at [facility], [address] [place], [place] [address]    Culture MULTIPLE SPECIES PRESENT, SUGGEST RECOLLECTION (A)    Report Status 02/13/2022 FINAL   Lactic acid, plasma     Status: None   Collection Time: 02/11/22  9:30 PM  Result Value Ref Range   Lactic Acid, Venous 1.4 0.5 - 1.9 mmol/L    Comment: Performed at [facility], [address] [place], [place] [address]  Troponin I (High Sensitivity)     Status: Abnormal   Collection Time: 02/11/22  9:30 PM  Result Value Ref Range   Troponin I (High Sensitivity) 41 (H) <18 ng/L    Comment: DELTA CHECK NOTED RESULT CALLED TO, READ BACK BY AND VERIFIED WITH ELendell Caprice, RN, 2303 02/11/22, A. RAMSEY (NOTE) Elevated high sensitivity troponin I (hsTnI) values and significant  changes across serial measurements may suggest ACS but many other  chronic and acute conditions are known to elevate hsTnI results.  Refer to the "Links" section for chest pain algorithms and additional  guidance. Performed at [facility], [address], [place], [place] [address]   Protime-INR     Status: None   Collection Time: 02/11/22  9:30 PM  Result Value Ref Range   Prothrombin Time 14.2 11.4 - 15.2 seconds   INR 1.1 0.8 - 1.2    Comment: (NOTE) INR goal varies based on device and disease states. Performed at [facility], [address] [place], [place] [address]   HIV Antibody (routine testing w rflx)     Status: None   Collection Time: 02/11/22  9:30 PM  Result Value Ref Range   HIV Screen 4th Generation wRfx Non Reactive Non Reactive    Comment: Performed at [facility], [address], [place], [place] [address]  Hemoglobin and hematocrit, blood     Status: Abnormal   Collection Time: 02/11/22  9:30 PM  Result Value Ref Range   Hemoglobin 6.5 (LL) 13.0 - 17.0 g/dL    Comment: CRITICAL VALUE NOTED.  VALUE IS CONSISTENT WITH PREVIOUSLY REPORTED AND CALLED VALUE. REPEATED TO VERIFY    HCT 19.3 (L) 39.0 - 52.0 %    Comment: Performed at [facility], [address] [place], [place] [address]  CBC with Differential/Platelet     Status: Abnormal   Collection Time: 02/12/22  7:08 AM  Result Value Ref Range   WBC 9.2 4.0 - 10.5 K/uL   RBC 2.83 (L) 4.22 - 5.81 MIL/uL   Hemoglobin 9.3 (L) 13.0 - 17.0 g/dL  Comment: REPEATED TO VERIFY POST TRANSFUSION SPECIMEN    HCT 26.6 (L) 39.0 - 52.0 %   MCV 94.0 80.0 - 100.0 fL   MCH 32.9 26.0 - 34.0 pg   MCHC 35.0 30.0 - 36.0 g/dL   RDW 14.8 11.5 - 15.5 %   Platelets 84 (L) 150 - 400 K/uL    Comment: Immature Platelet Fraction may be clinically indicated, consider ordering this additional test [id number] REPEATED TO VERIFY PLATELETS APPEAR DECREASED    nRBC 0.0 0.0 - 0.2 %   Neutrophils Relative % 71 %   Neutro Abs 6.5 1.7 - 7.7 K/uL   Lymphocytes Relative 17 %   Lymphs Abs 1.6 0.7 - 4.0 K/uL   Monocytes Relative 8 %   Monocytes Absolute 0.8 0.1 - 1.0 K/uL   Eosinophils Relative 2 %   Eosinophils Absolute 0.2 0.0 - 0.5 K/uL   Basophils Relative 1 %   Basophils Absolute 0.1 0.0 - 0.1 K/uL   Immature Granulocytes 1 %   Abs Immature Granulocytes 0.11 (H) 0.00 - 0.07 K/uL    Comment: Performed at [facility], [address] [place], [place] [address]  Comprehensive metabolic panel     Status: Abnormal   Collection  Time: 02/12/22  7:08 AM  Result Value Ref Range   Sodium 133 (L) 135 - 145 mmol/L   Potassium 4.5 3.5 - 5.1 mmol/L   Chloride 103 98 - 111 mmol/L   CO2 24 22 - 32 mmol/L   Glucose, Bld 140 (H) 70 - 99 mg/dL    Comment: Glucose reference range applies only to samples taken after fasting for at least 8 hours.   BUN 22 (H) 6 - 20 mg/dL   Creatinine, Ser 0.86 0.61 - 1.24 mg/dL   Calcium 8.2 (L) 8.9 - 10.3 mg/dL   Total Protein 5.3 (L) 6.5 - 8.1 g/dL   Albumin 3.0 (L) 3.5 - 5.0 g/dL   AST 32 15 - 41 U/L   ALT 20 0 - 44 U/L   Alkaline Phosphatase 16 (L) 38 - 126 U/L   Total Bilirubin 0.6 0.3 - 1.2 mg/dL   GFR, Estimated >60 >60 mL/min    Comment: (NOTE) Calculated using the CKD-EPI Creatinine Equation (2021)    Anion gap 6 5 - 15    Comment: Performed at [facility], [place] [address], [place], [place] [address]  Magnesium     Status: Abnormal   Collection Time: 02/12/22  7:08 AM  Result Value Ref Range   Magnesium 1.6 (L) 1.7 - 2.4 mg/dL    Comment: Performed at [facility] [address] [place], [place] [address]  Phosphorus     Status: None   Collection Time: 02/12/22  7:08 AM  Result Value Ref Range   Phosphorus 4.0 2.5 - 4.6 mg/dL    Comment: Performed at [facility] [address], [place], [place] [address]  CBC     Status: Abnormal   Collection Time: 02/12/22 12:21 PM  Result Value Ref Range   WBC 8.3 4.0 - 10.5 K/uL   RBC 3.04 (L) 4.22 - 5.81 MIL/uL   Hemoglobin 10.1 (L) 13.0 - 17.0 g/dL   HCT 28.5 (L) 39.0 - 52.0 %   MCV 93.8 80.0 - 100.0 fL   MCH 33.2 26.0 - 34.0 pg   MCHC 35.4 30.0 - 36.0 g/dL   RDW 15.1 11.5 - 15.5 %   Platelets 84 (L) 150 - 400  K/uL    Comment: Immature Platelet Fraction may be clinically indicated, consider ordering this additional test [id number] REPEATED TO VERIFY PLATELET COUNT CONFIRMED BY SMEAR    nRBC 0.0 0.0 - 0.2 %    Comment: Performed at [facility], [address], [place], [place] [address]  CBC      Status: Abnormal   Collection Time: 02/13/22  4:23 AM  Result Value Ref Range   WBC 10.0 4.0 - 10.5 K/uL   RBC 3.10 (L) 4.22 - 5.81 MIL/uL   Hemoglobin 10.1 (L) 13.0 - 17.0 g/dL   HCT 32.4 (L) 40.1 - 02.7 %   MCV 93.5 80.0 - 100.0 fL   MCH 32.6 26.0 - 34.0 pg   MCHC 34.8 30.0 - 36.0 g/dL   RDW 25.3 66.4 - 40.3 %   Platelets 91 (L) 150 - 400 K/uL    Comment: Immature Platelet Fraction may be clinically indicated, consider ordering this additional test [id number] REPEATED TO VERIFY    nRBC 0.3 (H) 0.0 - 0.2 %    Comment: Performed at [facility], [address], [place], [place] [address]  Basic metabolic panel     Status: Abnormal   Collection Time: 02/13/22  4:23 AM  Result Value Ref Range   Sodium 127 (L) 135 - 145 mmol/L   Potassium 4.2 3.5 - 5.1 mmol/L   Chloride 94 (L) 98 - 111 mmol/L   CO2 26 22 - 32 mmol/L   Glucose, Bld 104 (H) 70 - 99 mg/dL    Comment: Glucose reference range applies only to samples taken after fasting for at least 8 hours.   BUN 10 6 - 20 mg/dL   Creatinine, Ser 4.33 0.61 - 1.24 mg/dL   Calcium 8.9 8.9 - 29.5 mg/dL   GFR, Estimated >18 >84 mL/min    Comment: (NOTE) Calculated using the CKD-EPI Creatinine Equation (2021)    Anion gap 7 5 - 15    Comment: Performed at [facility], [address], [place], [place] [address]    Studies/Results: CT Angio Chest/Abd/Pel for Dissection W and/or Wo Contrast  Result Date: 02/11/2022 CLINICAL DATA:  Severe chest pain, shortness of breath EXAM: CT ANGIOGRAPHY CHEST, ABDOMEN AND PELVIS TECHNIQUE: Non-contrast CT of the chest was initially obtained. Multidetector CT imaging through the chest, abdomen and pelvis was performed using the standard protocol during bolus administration of intravenous contrast. Multiplanar reconstructed images and MIPs were obtained and reviewed to evaluate the vascular anatomy. RADIATION DOSE REDUCTION: This exam was performed according to the departmental  dose-optimization program which includes automated exposure control, adjustment of the mA and/or kV according to patient size and/or use of iterative reconstruction technique. CONTRAST:  OMNIPAQUE IOHEXOL 350 MG/ML SOLN COMPARISON:  CTA chest dated 07/16/2012 FINDINGS: CTA CHEST FINDINGS Cardiovascular: On unenhanced CT, there is no evidence intramural hematoma. Following contrast administration, there is no evidence of thoracic aortic aneurysm or dissection. Atherosclerotic calcifications of the arch. Although not tailored for evaluation, there is no evidence of pulmonary embolism to the segmental level. The heart is normal in size.  No pericardial effusion. Coronary atherosclerosis of the LAD and right coronary artery. Mediastinum/Nodes: No suspicious mediastinal lymphadenopathy. Mild wall thickening of the distal esophagus (series 5/image 125), nonspecific but suggesting reflux esophagitis. No associated pneumomediastinum. Visualized thyroid is unremarkable. Lungs/Pleura: Mild centrilobular emphysematous changes, upper lung predominant. No focal consolidation. No suspicious pulmonary nodules. No pleural effusion or pneumothorax. Musculoskeletal: No fracture is seen. Bilateral ribs  are intact. Mild degenerative changes of the lower thoracic spine. Review of the MIP images confirms the above findings. CTA ABDOMEN AND PELVIS FINDINGS VASCULAR Aorta: No evidence abdominal aortic aneurysm or dissection. Patent. Atherosclerotic calcifications. Celiac: Patent.  Atherosclerotic calcifications at the origin. SMA: Patent.  Atherosclerotic calcifications at the origin. Renals: Patent. Proximal atherosclerotic calcifications bilaterally. IMA: Patent. Inflow: Patent bilaterally.  Atherosclerotic calcifications. Veins: Unremarkable. Review of the MIP images confirms the above findings. NON-VASCULAR Hepatobiliary: Lobulated but non masslike contour anteriorly along segment 4 (series 5/image 142), favored to reflect  unusual orientation of normal hepatic parenchyma when correlating with sagittal images (sagittal image 72). Gallbladder is unremarkable. No intrahepatic or extrahepatic ductal dilatation. Pancreas: Within normal limits. No peripancreatic fluid/inflammatory changes. Spleen: Within normal limits. Adrenals/Urinary Tract: Adrenal glands are within normal limits. Left kidney is within normal limits. 15 mm left lower pole renal cyst (series 5/image 198), benign (Bosniak I). No follow-up is recommended. 3 mm nonobstructing interpolar right renal calculus (series 5/image 194). Additional 7 mm nonobstructing right lower pole renal calculus (series 5/image 206). No hydronephrosis. Mildly thick-walled bladder, although underdistended. Stomach/Bowel: Stomach is within normal limits. No evidence of bowel obstruction. Normal appendix (series 5/image 250). No colonic wall thickening or inflammatory changes. Lymphatic: No suspicious abdominopelvic lymphadenopathy. Reproductive: Prostate is unremarkable. Other: No abdominopelvic ascites. No free air. Musculoskeletal: Degenerative changes of the lumbar spine, most prominent at L5-S1. Review of the MIP images confirms the above findings. IMPRESSION: No evidence of thoracoabdominal aortic aneurysm or dissection. No evidence of pulmonary embolism. Mild wall thickening of the distal esophagus, nonspecific but suggesting reflux esophagitis. No pneumomediastinum. Nonobstructing right renal calculi.  No hydronephrosis. Electronically Signed   By: Julian Hy M.D.   On: 02/11/2022 19:32   DG Chest Portable 1 View  Result Date: 02/11/2022 CLINICAL DATA:  Chest pain EXAM: PORTABLE CHEST 1 VIEW COMPARISON:  02/04/2021 FINDINGS: The heart size and mediastinal contours are within normal limits. Both lungs are clear. The visualized skeletal structures are unremarkable. IMPRESSION: No active disease. Electronically Signed   By: Elmer Picker M.D.   On: 02/11/2022 19:08     Medications: I have reviewed the patient's current medications.  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

## 2022-02-14 ENCOUNTER — Inpatient Hospital Stay (HOSPITAL_COMMUNITY): Payer: 59

## 2022-02-14 DIAGNOSIS — F1010 Alcohol abuse, uncomplicated: Secondary | ICD-10-CM | POA: Diagnosis not present

## 2022-02-14 DIAGNOSIS — R079 Chest pain, unspecified: Secondary | ICD-10-CM

## 2022-02-14 DIAGNOSIS — K922 Gastrointestinal hemorrhage, unspecified: Secondary | ICD-10-CM | POA: Diagnosis not present

## 2022-02-14 DIAGNOSIS — D62 Acute posthemorrhagic anemia: Secondary | ICD-10-CM | POA: Diagnosis not present

## 2022-02-14 LAB — CBC
HCT: 28.6 % — ABNORMAL LOW (ref 39.0–52.0)
Hemoglobin: 10 g/dL — ABNORMAL LOW (ref 13.0–17.0)
MCH: 32.6 pg (ref 26.0–34.0)
MCHC: 35 g/dL (ref 30.0–36.0)
MCV: 93.2 fL (ref 80.0–100.0)
Platelets: 93 10*3/uL — ABNORMAL LOW (ref 150–400)
RBC: 3.07 MIL/uL — ABNORMAL LOW (ref 4.22–5.81)
RDW: 14.2 % (ref 11.5–15.5)
WBC: 7.6 10*3/uL (ref 4.0–10.5)
nRBC: 0 % (ref 0.0–0.2)

## 2022-02-14 LAB — BASIC METABOLIC PANEL
Anion gap: 9 (ref 5–15)
BUN: 10 mg/dL (ref 6–20)
CO2: 27 mmol/L (ref 22–32)
Calcium: 9.4 mg/dL (ref 8.9–10.3)
Chloride: 95 mmol/L — ABNORMAL LOW (ref 98–111)
Creatinine, Ser: 0.87 mg/dL (ref 0.61–1.24)
GFR, Estimated: >60 mL/min (ref >60)
Glucose, Bld: 118 mg/dL — ABNORMAL HIGH (ref 70–99)
Potassium: 3.9 mmol/L (ref 3.5–5.1)
Sodium: 131 mmol/L — ABNORMAL LOW (ref 135–145)

## 2022-02-14 LAB — TROPONIN I (HIGH SENSITIVITY)
Troponin I (High Sensitivity): 19 ng/L — ABNORMAL HIGH (ref <18)
Troponin I (High Sensitivity): 18 ng/L — ABNORMAL HIGH (ref <18)
Troponin I (High Sensitivity): 21 ng/L — ABNORMAL HIGH (ref <18)

## 2022-02-14 NOTE — Progress Notes (Addendum)
Physical Therapy Evaluation Patient Details Name: Christopher Logan MRN: [id number] DOB: 10-19-1960 Today's Date: 02/14/2022  History of Present Illness  61 yo male with onset of chest pain and back pain at home took nitro, became light headed, passed out and then EMS was called.  Admitted on 7/28, now dx with chest pain from reflux, has had hypotension and demand increase of troponin.  Has GI bleed with gastric ulcer found on upper GI scope.  PMHx:  smoking, hypertension, MI, EtOH use, cirrhosis, Hep C with portal HTN  Clinical Impression  Pt was seen for progression from bed to walk, but had episode of pain from his chest and back during second walk in the room.  Pt is demonstrating a limited tolerance for gait, requiring two handed assistance and use of  support from PT for maintaining safety.  Pt was in some pain in chest and back from neck to legs per pt, after second short walk with monitor reading 190 then back to 120's at 129.  Will recommend him to only go home if his family can be supportive of his needs, with HHPT for strengthening and monitoring of symptoms.  Follow acutely for goals of PT as are outlined below, with guidelines about RTW to be provided by medical staff.  Will update dc recommendations based on all testing to be done by MD today.  BP pregait sitting was 148/89, standing 124/81.     Recommendations for follow up therapy are one component of a multi-disciplinary discharge planning process, led by the attending physician.  Recommendations may be updated based on patient status, additional functional criteria and insurance authorization.  Follow Up Recommendations Home health PT      Assistance Recommended at Discharge Intermittent Supervision/Assistance  Patient can return home with the following  A little help with walking and/or transfers;A little help with bathing/dressing/bathroom;Assistance with cooking/housework;Direct supervision/assist for medications  management;Assist for transportation;Help with stairs or ramp for entrance    Equipment Recommendations Rolling walker (2 wheels)  Recommendations for Other Services       Functional Status Assessment Patient has had a recent decline in their functional status and demonstrates the ability to make significant improvements in function in a reasonable and predictable amount of time.     Precautions / Restrictions Precautions Precautions: Fall Precaution Comments: weakness and light headed feelings Restrictions Weight Bearing Restrictions: No Other Position/Activity Restrictions: on 2L O2 via cannula      Mobility  Bed Mobility Overal bed mobility: Needs Assistance Bed Mobility: Supine to Sit, Sit to Supine     Supine to sit: Min guard Sit to supine: Min guard, Min assist (to assist with legs)   General bed mobility comments: pt is able to assist moving but with return to bed in pain    Transfers Overall transfer level: Needs assistance Equipment used: 1 person hand held assist Transfers: Sit to/from Stand Sit to Stand: Min guard, Min assist           General transfer comment: minor help to power up and to assist with    Ambulation/Gait Ambulation/Gait assistance: Min guard, Min assist Gait Distance (Feet): 75 Feet (60+15) Assistive device: 1 person hand held assist, Rolling walker (2 wheels)   Gait velocity: variable Gait velocity interpretation: <1.8 ft/sec, indicate of risk for recurrent falls Pre-gait activities: balance in standing General Gait Details: walked initially on the hallway with wobbling legs, sat briefly then completed the task with HR in 90's during gait, but light headed. Added  RW for support and then pt began to complain of being very weak, O2 sats in 90's but HR elevated to 190 on screen, then settled to 129.  Has strong chest pain then back pain with shaking hands  Stairs            Wheelchair Mobility    Modified Rankin (Stroke  Patients Only)       Balance Overall balance assessment: Needs assistance Sitting-balance support: Feet supported Sitting balance-Leahy Scale: Fair     Standing balance support: Bilateral upper extremity supported, During functional activity Standing balance-Leahy Scale: Poor Standing balance comment: BUE Support on RW and with wall rail and HHA previously                             Pertinent Vitals/Pain Pain Assessment Pain Assessment: Faces Faces Pain Scale: Hurts whole lot Pain Location: chest and back at end of session Pain Descriptors / Indicators: Grimacing, Squeezing Pain Intervention(s): Monitored during session, Repositioned, Other (comment) (contacted nursing to assess pt due to magnitude of pain and elevation of HR)    Home Living Family/patient expects to be discharged to:: Private residence Living Arrangements: Other relatives Available Help at Discharge: Family;Available PRN/intermittently Type of Home: House Home Access: Stairs to enter Entrance Stairs-Rails: Can reach both Entrance Stairs-Number of Steps: 4   Home Layout: One level Home Equipment: None 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    Prior Function Prior Level of Function : Independent/Modified Independent             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, working Tree surgeon ADLs Comments: I for all, no adaptive equipment     Hand Dominance        Extremity/Trunk Assessment   Upper Extremity Assessment Upper Extremity Assessment: Overall WFL for tasks assessed    Lower Extremity Assessment Lower Extremity Assessment: Generalized weakness    Cervical / Trunk Assessment Cervical / Trunk Assessment: Kyphotic (mild)  Communication   Communication: No difficulties  Cognition Arousal/Alertness: Awake/alert Behavior During Therapy: Anxious Overall Cognitive Status: Within Functional Limits for tasks assessed                                  General Comments: pt was a bit anxious about sudden onset of pain on chest then back with HR elevating as well        General Comments General comments (skin integrity, edema, etc.): Pt was not on an AD prior to hosp and working as Engineer, building services.  Has new need for AD and tends to get elevation of HR with effort along with chest pain, back pain    Exercises     Assessment/Plan    PT Assessment Patient needs continued PT services  PT Problem List Decreased strength;Decreased range of motion;Decreased activity tolerance;Decreased balance;Decreased mobility;Decreased coordination;Decreased knowledge of use of DME       PT Treatment Interventions DME instruction;Gait training;Functional mobility training;Therapeutic activities;Therapeutic exercise;Balance training;Neuromuscular re-education;Patient/family education;Stair training    PT Goals (Current goals can be found in the Care Plan section)  Acute Rehab PT Goals Patient Stated Goal: to go directly home with his sister PT Goal Formulation: With patient Time For Goal Achievement: 02/21/22 Potential to Achieve Goals: Good    Frequency Min 3X/week     Co-evaluation               AM-PAC  PT "6 Clicks" Mobility  Outcome Measure Help needed turning from your back to your side while in a flat bed without using bedrails?: None Help needed moving from lying on your back to sitting on the side of a flat bed without using bedrails?: A Little Help needed moving to and from a bed to a chair (including a wheelchair)?: A Little Help needed standing up from a chair using your arms (e.g., wheelchair or bedside chair)?: A Little Help needed to walk in hospital room?: A Little Help needed climbing 3-5 steps with a railing? : A Lot 6 Click Score: 18    End of Session Equipment Utilized During Treatment: Oxygen Activity Tolerance: Patient limited by fatigue;Treatment limited secondary to medical complications  (Comment);Patient limited by pain Patient left: in bed;with call bell/phone within reach;with bed alarm set;Other (comment) (With MD in room) Nurse Communication: Other (comment) (chest pain and elevation of HR) PT Visit Diagnosis: Unsteadiness on feet (R26.81);Muscle weakness (generalized) (M62.81);Difficulty in walking, not elsewhere classified (R26.2);Pain Pain - Right/Left:  (chest and back) Pain - part of body:  (chest and back)    Time: 7616-0737 PT Time Calculation (min) (ACUTE ONLY): 35 min   Charges:   PT Evaluation $PT Eval Moderate Complexity: 1 Mod PT Treatments $Gait Training: 8-22 mins       Ivar Drape 02/14/2022, 4:24 PM  Samul Dada, PT PhD Acute Rehab Dept. Number: [facility] [phone] and [facility] [phone]

## 2022-02-14 NOTE — Progress Notes (Signed)
While working with PT, patient experienced chest pain and increased HR. Nurse called to room.  Nurse and Dr. Randol Kern reviewed pt snippets. One snippet showed HR of 190s, however was not accurate. Pt however did seem to reach 120s at one point.  Per Elgergawy, MD, I ordered STAT ekg and troponins.  Pt says at this time he only has chest pain while breathing in. O2 sats never dropped and remained stable, HR now resting in 80s/90s.

## 2022-02-14 NOTE — Plan of Care (Signed)

## 2022-02-14 NOTE — Progress Notes (Signed)
  Transition of Care [facility]) Screening Note   Patient Details  Name: Christopher Logan Date of Birth: 1961-04-08   Transition of Care [facility]) CM/SW Contact:    Harriet Masson, RN Phone Number: 02/14/2022, 9:01 AM    Transition of Care Department [facility]) has reviewed patient. We will continue to monitor patient advancement through interdisciplinary progression rounds. Patient needs SA counseling for alcohol.

## 2022-02-14 NOTE — Progress Notes (Signed)
PROGRESS NOTE    Christopher Logan  [name]:[id number] DOB: [birth year] DOA: 02/11/2022 PCP: [name], MD   Chief Complaint  Patient presents with   Chest Pain   Hypotension    Brief Narrative:     Christopher Logan is a 61 y.o. male with medical history significant for hypertension, former tobacco user with 50+ pack year smoking history, ongoing alcohol abuse, GERD, treated hepatitis C with cirrhosis of liver, portal hypertension, chronic back pain and bilateral lower extremity numbness (referred to neurosurgery), who presented to [facility] ED via EMS due to severe chest pain and back pain.  He took a sublingual nitroglycerin at home, felt dizzy, and nearly passed out.  Denies loss of consciousness.  EMS was activated.  Upon EMS arrival, the patient was hypotensive with BP 70/40, improved with 500 cc normal saline bolus.  He received a full dose aspirin in route 324 mg x 1 for chest pain.     Endorses daily excessive alcohol use, liquor.  Last use of alcohol was the morning of his presentation.  Admits to frequent use of Goody powder for his back pain.  He noted black tarry stools that started today.  Denies hematemesis.  Admits to emesis every other day for the past 2 weeks.   In the ED, the patient is noted to have melena from bowel incontinence, with positive FOBT.  Presenting hemoglobin 6.6 K.  Dissection study negative for aortic dissection or aortic aneurysm however revealed evidence of reflux esophagitis.  EDP discussed the case with [facility] Dr. Marca Ancona.  2 units PRBCs ordered to be transfused.  The patient was started on octreotide drip and Protonix drip.  Rocephin added prophylactically due to concern for upper GI bleed in the setting of portal hypertension and liver cirrhosis.  EDP requested admission.  The patient was admitted by [facility], hospitalist service.  At the time of this visit, his chest pain had resolved.   Assessment & Plan:   Principal Problem:   GI bleed Active Problems:    Alcohol abuse   Acute blood loss anemia   Upper GI bleed Gastric ulcer Duodenal ulcer Acute blood loss anemia -Presented with hemoglobin of 6.6, melena, transfused units PRBC with good response, continue to monitor CBC closely. -Concern for esophageal varices given liver cirrhosis alcohol abuse, no evidence of varices on endoscopy , I have discontinued his Rocephin and octreotide drip. -Continue with Protonix drip , 24 hours after endoscopy per GI recommendations -Continue with clear liquid diet, advance as tolerated -Endoscopy by [facility] significant for normal esophagus, portal hypertensive gastropathy, nonbleeding gastric ulcer with clean ulcer base, nonbleeding with an ulcer with clean ulcer base. -Endorses using Goody's powder    Elevated troponin suspect demand ischemia in the setting of acute blood loss anemia Initial troponin 13, up-trended 41 At the time of this visit his chest pain had resolved.  Hyponatremia -Likely due to clear liquid diet.  Will put on fluid restrictions.   Chest pain -patient with complaints of chest pain earlier today while ambulating with PT, sinus tachycardia in the 120s, will obtain EKG, troponins and x-ray -2D echo done last month with a preserved EF -Patient presents with similar complaints including chest pain, shortness of breath, back pain, his CTA chest/abdomen/pelvis with no evidence of dissecting aneurysm or PE, no indication to repeat   Liver cirrhosis with portal hypertension Hepatitis C, with prior treatment. Alcohol abuse Avoid hepatotoxic agents. Alcohol cessation counseling at bedside Monitor LFTs. Obtain CMP in the morning  Euvolemic hyponatremia Serum sodium 131 Repeat chemistry panel in the morning   Lactic acidosis in the setting of upper GI bleed Lactic acid 3.1 on presentation Received 500 cc normal saline bolus via EMS IV albumin 12.5 g every 6 hours x3 doses Repeat lactic acid level   Mild non-anion gap metabolic  acidosis in the setting of lactic acidosis Serum bicarb 20, lactic acid 3.1 Treat underlying conditions Repeat CMP in the morning.   Chronic thrombocytopenia in the setting of liver cirrhosis Platelet count 108 Monitor for now   Essential hypertension, BPs are soft Hold off home oral antihypertensives. Closely monitor vital signs Maintain MAP greater than 65 in the setting of upper GI bleed   Chronic back pain and lower extremity numbness Has been referred to neurosurgery in the outpatient setting Continue supportive care.   Alcohol abuse Endorses daily excessive alcohol use Last use of alcohol was the morning of his presentation Alcohol cessation counseling done at bedside.  The patient is receptive. CIWA protocol TOC consulted to assist with providing resources for alcohol cessation     DVT prophylaxis: SCD Code Status: Full Family Communication: None at bedside Disposition:   Status is: Inpatient    Consultants:  [facility]   Subjective:  Earlier with no complaints, later in the morning he develops dizziness, dyspnea and chest pain while ambulating with PT  Objective: Vitals:   02/14/22 0100 02/14/22 0400 02/14/22 0848 02/14/22 1301  BP: (!) 146/92 108/78 (!) 156/86 140/80  Pulse: 87 68 82 77  Resp: 20 16 19 16   Temp: 97.8 F (36.6 C) 97.8 F (36.6 C) 97.6 F (36.4 C) 98.2 F (36.8 C)  TempSrc: Oral Oral Oral Oral  SpO2:  95%  98%  Weight:      Height:        Intake/Output Summary (Last 24 hours) at 02/14/2022 1345 Last data filed at 02/14/2022 1302 Gross per 24 hour  Intake 320.01 ml  Output 1850 ml  Net -1529.99 ml   Filed Weights   02/11/22 1914 02/12/22 1036  Weight: 62.6 kg 62.5 kg    Examination:  Awake Alert, Oriented X 3, No new F.N deficits, Normal affect Symmetrical Chest wall movement, Good air movement bilaterally, CTAB RRR,No Gallops,Rubs or new Murmurs, No Parasternal Heave +ve B.Sounds, Abd Soft, No tenderness, No rebound -  guarding or rigidity. No Cyanosis, Clubbing or edema, No new Rash or bruise      Data Reviewed: I have personally reviewed following labs and imaging studies  CBC: Recent Labs  Lab 02/11/22 1845 02/11/22 1850 02/11/22 2130 02/12/22 0708 02/12/22 1221 02/13/22 0423 02/14/22 0223  WBC 11.9*  --   --  9.2 8.3 10.0 7.6  NEUTROABS  --   --   --  6.5  --   --   --   HGB 6.6*   < > 6.5* 9.3* 10.1* 10.1* 10.0*  HCT 19.4*   < > 19.3* 26.6* 28.5* 29.0* 28.6*  MCV 99.5  --   --  94.0 93.8 93.5 93.2  PLT 108*  --   --  84* 84* 91* 93*   < > = values in this interval not displayed.    Basic Metabolic Panel: Recent Labs  Lab 02/11/22 1845 02/11/22 1850 02/12/22 0708 02/13/22 0423 02/14/22 0223  NA 131* 131* 133* 127* 131*  K 3.9 4.0 4.5 4.2 3.9  CL 102 98 103 94* 95*  CO2 20*  --  24 26 27   GLUCOSE 104* 97 140*  104* 118*  BUN 27* 29* 22* 10 10  CREATININE 0.92 0.90 0.86 0.79 0.87  CALCIUM 8.8*  --  8.2* 8.9 9.4  MG  --   --  1.6*  --   --   PHOS  --   --  4.0  --   --     GFR: Estimated Creatinine Clearance: 79.8 mL/min (by C-G formula based on SCr of 0.87 mg/dL).  Liver Function Tests: Recent Labs  Lab 02/11/22 1845 02/12/22 0708  AST 40 32  ALT 19 20  ALKPHOS 17* 16*  BILITOT 0.6 0.6  PROT 5.6* 5.3*  ALBUMIN 3.1* 3.0*    CBG: No results for input(s): "GLUCAP" in the last 168 hours.   Recent Results (from the past 240 hour(s))  Urine Culture     Status: Abnormal   Collection Time: 02/11/22  9:27 PM   Specimen: Urine, Clean Catch  Result Value Ref Range Status   Specimen Description URINE, CLEAN CATCH  Final   Special Requests   Final    NONE Performed at [facility], [address], [place], [place] [address]    Culture MULTIPLE SPECIES PRESENT, SUGGEST RECOLLECTION (A)  Final   Report Status 02/13/2022 FINAL  Final         Radiology Studies: No results found.      Scheduled Meds:  folic acid  1 mg Oral Daily   multivitamin with  minerals  1 tablet Oral Daily   pantoprazole  40 mg Intravenous Q12H   thiamine  100 mg Oral Daily   Or   thiamine  100 mg Intravenous Daily   Continuous Infusions:     LOS: 3 days       Huey Bienenstock, MD [facility]   To contact the attending provider between 7A-7P or the covering provider during after hours 7P-7A, please log into the web site [email or web address] and access using universal [place] password for that web site. If you do not have the password, please call the hospital operator.  02/14/2022, 1:45 PM

## 2022-02-15 ENCOUNTER — Inpatient Hospital Stay (HOSPITAL_COMMUNITY): Payer: 59

## 2022-02-15 ENCOUNTER — Other Ambulatory Visit (HOSPITAL_COMMUNITY): Payer: Self-pay

## 2022-02-15 DIAGNOSIS — K259 Gastric ulcer, unspecified as acute or chronic, without hemorrhage or perforation: Secondary | ICD-10-CM

## 2022-02-15 DIAGNOSIS — K269 Duodenal ulcer, unspecified as acute or chronic, without hemorrhage or perforation: Secondary | ICD-10-CM

## 2022-02-15 DIAGNOSIS — F1010 Alcohol abuse, uncomplicated: Secondary | ICD-10-CM | POA: Diagnosis not present

## 2022-02-15 DIAGNOSIS — D696 Thrombocytopenia, unspecified: Secondary | ICD-10-CM

## 2022-02-15 DIAGNOSIS — K279 Peptic ulcer, site unspecified, unspecified as acute or chronic, without hemorrhage or perforation: Secondary | ICD-10-CM

## 2022-02-15 DIAGNOSIS — K922 Gastrointestinal hemorrhage, unspecified: Secondary | ICD-10-CM | POA: Diagnosis not present

## 2022-02-15 MED ORDER — COLCHICINE 0.6 MG PO TABS
0.6000 mg | ORAL_TABLET | Freq: Every day | ORAL | Status: DC
  Administered 2022-02-15: 0.6 mg via ORAL
  Filled 2022-02-15: qty 1

## 2022-02-15 MED ORDER — COLCHICINE 0.6 MG PO TABS
0.6000 mg | ORAL_TABLET | Freq: Every day | ORAL | 0 refills | Status: DC
  Filled 2022-02-15: qty 5, 5d supply, fill #0

## 2022-02-15 MED ORDER — ADULT MULTIVITAMIN W/MINERALS CH
1.0000 | ORAL_TABLET | Freq: Every day | ORAL | 0 refills | Status: DC
  Filled 2022-02-15: qty 30, 30d supply, fill #0

## 2022-02-15 MED ORDER — METHYLPREDNISOLONE SODIUM SUCC 125 MG IJ SOLR
80.0000 mg | Freq: Once | INTRAMUSCULAR | Status: AC
  Administered 2022-02-15: 80 mg via INTRAVENOUS
  Filled 2022-02-15: qty 2

## 2022-02-15 MED ORDER — PANTOPRAZOLE SODIUM 40 MG PO TBEC
40.0000 mg | DELAYED_RELEASE_TABLET | Freq: Two times a day (BID) | ORAL | 0 refills | Status: DC
  Filled 2022-02-15: qty 60, 30d supply, fill #0

## 2022-02-15 MED ORDER — THIAMINE HCL 100 MG PO TABS
100.0000 mg | ORAL_TABLET | Freq: Every day | ORAL | 0 refills | Status: DC
  Filled 2022-02-15: qty 30, 30d supply, fill #0

## 2022-02-15 MED ORDER — FOLIC ACID 1 MG PO TABS
1.0000 mg | ORAL_TABLET | Freq: Every day | ORAL | 0 refills | Status: DC
  Filled 2022-02-15: qty 30, 30d supply, fill #0

## 2022-02-15 NOTE — Discharge Summary (Addendum)
Physician Discharge Summary  Christopher Logan:[id number] DOB: 06-06-1961 DOA: 02/11/2022  PCP: [name], MD  Admit date: 02/11/2022 Discharge date: 02/15/2022  Admitted From: Home Disposition:  Home   Recommendations for Outpatient Follow-up:  Follow up with PCP in 1-2 weeks Please obtain BMP/CBC in one week Please follow up with GI as an outpatient Continue counseling about alcohol abuse  Home Health: He declined  Discharge Condition:Stable CODE STATUS:FULL Diet recommendation: Heart Healthy   Brief/Interim Summary: Christopher Logan is a 61 y.o. male with medical history significant for hypertension, former tobacco user with 50+ pack year smoking history, ongoing alcohol abuse, GERD, treated hepatitis C with cirrhosis of liver, portal hypertension, chronic back pain and bilateral lower extremity numbness (referred to neurosurgery), who presented to [facility] ED via EMS due to severe chest pain and back pain.  He took a sublingual nitroglycerin at home, felt dizzy, and nearly passed out.  Denies loss of consciousness.  EMS was activated.  Upon EMS arrival, the patient was hypotensive with BP 70/40, improved with 500 cc normal saline bolus.  He received a full dose aspirin in route 324 mg x 1 for chest pain -Work-up significant for acute blood loss anemia, with duodenal and gastric ulcer found in endoscopy, no further evidence of upper GI bleed during hospital stay after endoscopy.   Upper GI bleed Gastric ulcer Duodenal ulcer Acute blood loss anemia -Presented with hemoglobin of 6.6, melena, transfused units PRBC with good response, globin remained stable with most recent value of 10 on 7/31.  -She was kept on octreotide and Rocephin drip, Rocephin initially due to concern of esophageal varices given history of heavy alcohol abuse and liver cirrhosis, but no evidence of varices on diascopy, so octreotide and Rocephin has been discontinued.  he was treated with IV Protonix, he will be  discharged on Protonix 40 mg p.o. twice daily -Endoscopy by [facility] significant for normal esophagus, portal hypertensive gastropathy, nonbleeding gastric ulcer with clean ulcer base, nonbleeding with an ulcer with clean ulcer base. -Endorses using Goody's powder, was instructed not to use NSAIDs. -BC, follow-up with GI as an outpatient     Elevated troponin suspect demand ischemia in the setting of acute blood loss anemia Initial troponin 13, up-trended 41 Is likely related to demand ischemia   Hyponatremia -Mild, symptomatic     Chest pain -patient with complaints of chest pain earlier today while ambulating with PT, sinus tachycardia in the 120s, will obtain EKG, troponins and x-ray -2D echo done last month with a preserved EF -Patient presents with similar complaints including chest pain, shortness of breath, back pain, his CTA chest/abdomen/pelvis with no evidence of dissecting aneurysm or PE, no indication to repeat -Findings are stable 19>18> 21   Liver cirrhosis with portal hypertension Hepatitis C, with prior treatment. Alcohol abuse Avoid hepatotoxic agents. Alcohol cessation counseling at bedside     Lactic acidosis in the setting of upper GI bleed Lactic acid 3.1 on presentation Normalized with 1.4 recent value   Mild non-anion gap metabolic acidosis in the setting of lactic acidosis Serum bicarb 20, lactic acid 3.1 Resolved   Chronic thrombocytopenia in the setting of liver cirrhosis Chronic, no indication for transfusion   Essential hypertension, BPs are soft Pressure has been soft during hospital stay, normalized by time of discharge   Chronic back pain and lower extremity numbness Has been referred to neurosurgery in the outpatient setting Continue supportive care.   Alcohol abuse Endorses daily excessive alcohol use No  withdrawals during hospital stay, he was on CIWA protocol -Continue with thiamine, folic acid and multivitamins  Left ankle  pain/gout flare -Give 1 dose of IV Solu-Medrol before discharge, will give colchicine for 5 days upon discharge.  Discharge Diagnoses:  Principal Problem:   GI bleed Active Problems:   Alcohol abuse   Acute blood loss anemia   Gastric ulcer   Duodenal ulcer   Thrombocytopenia (HCC)    Discharge Instructions  Discharge Instructions     Diet - low sodium heart healthy   Complete by: As directed    Discharge instructions   Complete by: As directed    Follow with Primary MD Blair HeysEhinger, Robert, MD in 7 days   Get CBC, CMP,  checked  by Primary MD next visit.    Activity: As tolerated with Full fall precautions use walker/cane & assistance as needed   Disposition Home    Diet: Heart Healthy    On your next visit with your primary care physician please Get Medicines reviewed and adjusted.   Please request your Prim.MD to go over all Hospital Tests and Procedure/Radiological results at the follow up, please get all Hospital records sent to your Prim MD by signing hospital release before you go home.   If you experience worsening of your admission symptoms, develop shortness of breath, life threatening emergency, suicidal or homicidal thoughts you must seek medical attention immediately by calling 911 or calling your MD immediately  if symptoms less severe.  You Must read complete instructions/literature along with all the possible adverse reactions/side effects for all the Medicines you take and that have been prescribed to you. Take any new Medicines after you have completely understood and accpet all the possible adverse reactions/side effects.   Do not drive, operating heavy machinery, perform activities at heights, swimming or participation in water activities or provide baby sitting services if your were admitted for syncope or siezures until you have seen by Primary MD or a Neurologist and advised to do so again.  Do not drive when taking Pain medications.    Do not  take more than prescribed Pain, Sleep and Anxiety Medications  Special Instructions: If you have smoked or chewed Tobacco  in the last 2 yrs please stop smoking, stop any regular Alcohol  and or any Recreational drug use.  Wear Seat belts while driving.   Please note  You were cared for by a hospitalist during your hospital stay. If you have any questions about your discharge medications or the care you received while you were in the hospital after you are discharged, you can call the unit and asked to speak with the hospitalist on call if the hospitalist that took care of you is not available. Once you are discharged, your primary care physician will handle any further medical issues. Please note that NO REFILLS for any discharge medications will be authorized once you are discharged, as it is imperative that you return to your primary care physician (or establish a relationship with a primary care physician if you do not have one) for your aftercare needs so that they can reassess your need for medications and monitor your lab values.   Increase activity slowly   Complete by: As directed       Allergies as of 02/15/2022       Reactions   Bee Venom Anaphylaxis   Codeine Nausea Only        Medication List     STOP taking these medications  Goodys Back & Body Pain 500-325 MG Pack Generic drug: Aspirin-Acetaminophen       TAKE these medications    colchicine 0.6 MG tablet Take 1 tablet (0.6 mg total) by mouth daily.   folic acid 1 MG tablet Commonly known as: FOLVITE Take 1 tablet (1 mg total) by mouth daily. Start taking on: February 16, 2022   lisinopril 40 MG tablet Commonly known as: ZESTRIL Take 40 mg by mouth daily.   multivitamin with minerals Tabs tablet Take 1 tablet by mouth daily. Start taking on: February 16, 2022   pantoprazole 40 MG tablet Commonly known as: Protonix Take 1 tablet (40 mg total) by mouth 2 (two) times daily.   thiamine 100 MG  tablet Commonly known as: VITAMIN B1 Take 1 tablet (100 mg total) by mouth daily. Start taking on: February 16, 2022               Durable Medical Equipment  (From admission, onward)           Start     Ordered   02/15/22 0955  For home use only DME Walker rolling  Once       Question Answer Comment  Walker: With 5 Inch Wheels   Patient needs a walker to treat with the following condition Weakness      02/15/22 0954            Allergies  Allergen Reactions   Bee Venom Anaphylaxis   Codeine Nausea Only    Consultations: [facility]   Procedures/Studies: DG Chest Port 1 View  Result Date: 02/14/2022 CLINICAL DATA:  Shortness of breath EXAM: PORTABLE CHEST 1 VIEW COMPARISON:  Chest x-ray dated February 11, 2022 FINDINGS: The heart size and mediastinal contours are within normal limits. Both lungs are clear. The visualized skeletal structures are unremarkable. IMPRESSION: No active disease. Electronically Signed   By: Allegra Lai M.D.   On: 02/14/2022 15:16   CT Angio Chest/Abd/Pel for Dissection W and/or Wo Contrast  Result Date: 02/11/2022 CLINICAL DATA:  Severe chest pain, shortness of breath EXAM: CT ANGIOGRAPHY CHEST, ABDOMEN AND PELVIS TECHNIQUE: Non-contrast CT of the chest was initially obtained. Multidetector CT imaging through the chest, abdomen and pelvis was performed using the standard protocol during bolus administration of intravenous contrast. Multiplanar reconstructed images and MIPs were obtained and reviewed to evaluate the vascular anatomy. RADIATION DOSE REDUCTION: This exam was performed according to the departmental dose-optimization program which includes automated exposure control, adjustment of the mA and/or kV according to patient size and/or use of iterative reconstruction technique. CONTRAST:  OMNIPAQUE IOHEXOL 350 MG/ML SOLN COMPARISON:  CTA chest dated 07/16/2012 FINDINGS: CTA CHEST FINDINGS Cardiovascular: On unenhanced CT, there is no  evidence intramural hematoma. Following contrast administration, there is no evidence of thoracic aortic aneurysm or dissection. Atherosclerotic calcifications of the arch. Although not tailored for evaluation, there is no evidence of pulmonary embolism to the segmental level. The heart is normal in size.  No pericardial effusion. Coronary atherosclerosis of the LAD and right coronary artery. Mediastinum/Nodes: No suspicious mediastinal lymphadenopathy. Mild wall thickening of the distal esophagus (series 5/image 125), nonspecific but suggesting reflux esophagitis. No associated pneumomediastinum. Visualized thyroid is unremarkable. Lungs/Pleura: Mild centrilobular emphysematous changes, upper lung predominant. No focal consolidation. No suspicious pulmonary nodules. No pleural effusion or pneumothorax. Musculoskeletal: No fracture is seen. Bilateral ribs are intact. Mild degenerative changes of the lower thoracic spine. Review of the MIP images confirms the above findings. CTA ABDOMEN AND  PELVIS FINDINGS VASCULAR Aorta: No evidence abdominal aortic aneurysm or dissection. Patent. Atherosclerotic calcifications. Celiac: Patent.  Atherosclerotic calcifications at the origin. SMA: Patent.  Atherosclerotic calcifications at the origin. Renals: Patent. Proximal atherosclerotic calcifications bilaterally. IMA: Patent. Inflow: Patent bilaterally.  Atherosclerotic calcifications. Veins: Unremarkable. Review of the MIP images confirms the above findings. NON-VASCULAR Hepatobiliary: Lobulated but non masslike contour anteriorly along segment 4 (series 5/image 142), favored to reflect unusual orientation of normal hepatic parenchyma when correlating with sagittal images (sagittal image 72). Gallbladder is unremarkable. No intrahepatic or extrahepatic ductal dilatation. Pancreas: Within normal limits. No peripancreatic fluid/inflammatory changes. Spleen: Within normal limits. Adrenals/Urinary Tract: Adrenal glands are within  normal limits. Left kidney is within normal limits. 15 mm left lower pole renal cyst (series 5/image 198), benign (Bosniak I). No follow-up is recommended. 3 mm nonobstructing interpolar right renal calculus (series 5/image 194). Additional 7 mm nonobstructing right lower pole renal calculus (series 5/image 206). No hydronephrosis. Mildly thick-walled bladder, although underdistended. Stomach/Bowel: Stomach is within normal limits. No evidence of bowel obstruction. Normal appendix (series 5/image 250). No colonic wall thickening or inflammatory changes. Lymphatic: No suspicious abdominopelvic lymphadenopathy. Reproductive: Prostate is unremarkable. Other: No abdominopelvic ascites. No free air. Musculoskeletal: Degenerative changes of the lumbar spine, most prominent at L5-S1. Review of the MIP images confirms the above findings. IMPRESSION: No evidence of thoracoabdominal aortic aneurysm or dissection. No evidence of pulmonary embolism. Mild wall thickening of the distal esophagus, nonspecific but suggesting reflux esophagitis. No pneumomediastinum. Nonobstructing right renal calculi.  No hydronephrosis. Electronically Signed   By: Charline Bills M.D.   On: 02/11/2022 19:32   DG Chest Portable 1 View  Result Date: 02/11/2022 CLINICAL DATA:  Chest pain EXAM: PORTABLE CHEST 1 VIEW COMPARISON:  02/04/2021 FINDINGS: The heart size and mediastinal contours are within normal limits. Both lungs are clear. The visualized skeletal structures are unremarkable. IMPRESSION: No active disease. Electronically Signed   By: Ernie Avena M.D.   On: 02/11/2022 19:08     Subjective: He reports left ankle pain, no nausea, no vomiting, tolerating oral intake, no chest pain or dyspnea  Discharge Exam: Vitals:   02/15/22 0411 02/15/22 0823  BP: (!) 140/91 113/61  Pulse: 78 92  Resp: 18 16  Temp: 98.3 F (36.8 C) 98.3 F (36.8 C)  SpO2: 99% 97%   Vitals:   02/14/22 2035 02/15/22 0005 02/15/22 0411 02/15/22  0823  BP: (!) 143/98 123/85 (!) 140/91 113/61  Pulse: 84 79 78 92  Resp: 20 16 18 16   Temp: 98.6 F (37 C) 97.6 F (36.4 C) 98.3 F (36.8 C) 98.3 F (36.8 C)  TempSrc: Oral Oral Oral Oral  SpO2: 97% 98% 99% 97%  Weight:      Height:        General: Pt is alert, awake, not in acute distress Cardiovascular: RRR, S1/S2 +, no rubs, no gallops Respiratory: CTA bilaterally, no wheezing, no rhonchi Abdominal: Soft, NT, ND, bowel sounds + Extremities: no edema, no cyanosis    The results of significant diagnostics from this hospitalization (including imaging, microbiology, ancillary and laboratory) are listed below for reference.     Microbiology: Recent Results (from the past 240 hour(s))  Urine Culture     Status: Abnormal   Collection Time: 02/11/22  9:27 PM   Specimen: Urine, Clean Catch  Result Value Ref Range Status   Specimen Description URINE, CLEAN CATCH  Final   Special Requests   Final    NONE Performed at [facility]  Hospital Lab, [address] [place], [place] [address]    Culture MULTIPLE SPECIES PRESENT, SUGGEST RECOLLECTION (A)  Final   Report Status 02/13/2022 FINAL  Final     Labs: BNP (last 3 results) No results for input(s): "BNP" in the last 8760 hours. Basic Metabolic Panel: Recent Labs  Lab 02/11/22 1845 02/11/22 1850 02/12/22 0708 02/13/22 0423 02/14/22 0223  NA 131* 131* 133* 127* 131*  K 3.9 4.0 4.5 4.2 3.9  CL 102 98 103 94* 95*  CO2 20*  --  24 26 27   GLUCOSE 104* 97 140* 104* 118*  BUN 27* 29* 22* 10 10  CREATININE 0.92 0.90 0.86 0.79 0.87  CALCIUM 8.8*  --  8.2* 8.9 9.4  MG  --   --  1.6*  --   --   PHOS  --   --  4.0  --   --    Liver Function Tests: Recent Labs  Lab 02/11/22 1845 02/12/22 0708  AST 40 32  ALT 19 20  ALKPHOS 17* 16*  BILITOT 0.6 0.6  PROT 5.6* 5.3*  ALBUMIN 3.1* 3.0*   Recent Labs  Lab 02/11/22 1845  LIPASE 39   No results for input(s): "AMMONIA" in the last 168 hours. CBC: Recent Labs  Lab  02/11/22 1845 02/11/22 1850 02/11/22 2130 02/12/22 0708 02/12/22 1221 02/13/22 0423 02/14/22 0223  WBC 11.9*  --   --  9.2 8.3 10.0 7.6  NEUTROABS  --   --   --  6.5  --   --   --   HGB 6.6*   < > 6.5* 9.3* 10.1* 10.1* 10.0*  HCT 19.4*   < > 19.3* 26.6* 28.5* 29.0* 28.6*  MCV 99.5  --   --  94.0 93.8 93.5 93.2  PLT 108*  --   --  84* 84* 91* 93*   < > = values in this interval not displayed.   Cardiac Enzymes: No results for input(s): "CKTOTAL", "CKMB", "CKMBINDEX", "TROPONINI" in the last 168 hours. BNP: Invalid input(s): "POCBNP" CBG: No results for input(s): "GLUCAP" in the last 168 hours. D-Dimer No results for input(s): "DDIMER" in the last 72 hours. Hgb A1c No results for input(s): "HGBA1C" in the last 72 hours. Lipid Profile No results for input(s): "CHOL", "HDL", "LDLCALC", "TRIG", "CHOLHDL", "LDLDIRECT" in the last 72 hours. Thyroid function studies No results for input(s): "TSH", "T4TOTAL", "T3FREE", "THYROIDAB" in the last 72 hours.  Invalid input(s): "FREET3" Anemia work up No results for input(s): "VITAMINB12", "FOLATE", "FERRITIN", "TIBC", "IRON", "RETICCTPCT" in the last 72 hours. Urinalysis    Component Value Date/Time   COLORURINE STRAW (A) 02/11/2022 2126   APPEARANCEUR CLEAR 02/11/2022 2126   LABSPEC 1.021 02/11/2022 2126   PHURINE 6.0 02/11/2022 2126   GLUCOSEU NEGATIVE 02/11/2022 2126   HGBUR NEGATIVE 02/11/2022 2126   BILIRUBINUR NEGATIVE 02/11/2022 2126   KETONESUR NEGATIVE 02/11/2022 2126   PROTEINUR NEGATIVE 02/11/2022 2126   NITRITE NEGATIVE 02/11/2022 2126   LEUKOCYTESUR NEGATIVE 02/11/2022 2126   Sepsis Labs Recent Labs  Lab 02/12/22 0708 02/12/22 1221 02/13/22 0423 02/14/22 0223  WBC 9.2 8.3 10.0 7.6   Microbiology Recent Results (from the past 240 hour(s))  Urine Culture     Status: Abnormal   Collection Time: 02/11/22  9:27 PM   Specimen: Urine, Clean Catch  Result Value Ref Range Status   Specimen Description URINE, CLEAN  CATCH  Final   Special Requests   Final    NONE Performed at [facility]  Hospital Lab, [address], [place], [place] [address]    Culture MULTIPLE SPECIES PRESENT, SUGGEST RECOLLECTION (A)  Final   Report Status 02/13/2022 FINAL  Final     Time coordinating discharge: Over 30 minutes  SIGNED:   Huey Bienenstock, MD  [facility] 02/15/2022, 10:45 AM Pager   If 7PM-7AM, please contact night-coverage [email or web address] Password [email or web address]

## 2022-02-15 NOTE — Discharge Instructions (Signed)
Follow with Primary MD [name], MD in 7 days   Get CBC, CMP,  checked  by Primary MD next visit.    Activity: As tolerated with Full fall precautions use walker/cane & assistance as needed   Disposition Home    Diet: Heart Healthy    On your next visit with your primary care physician please Get Medicines reviewed and adjusted.   Please request your Prim.MD to go over all Hospital Tests and Procedure/Radiological results at the follow up, please get all Hospital records sent to your Prim MD by signing hospital release before you go home.   If you experience worsening of your admission symptoms, develop shortness of breath, life threatening emergency, suicidal or homicidal thoughts you must seek medical attention immediately by calling 911 or calling your MD immediately  if symptoms less severe.  You Must read complete instructions/literature along with all the possible adverse reactions/side effects for all the Medicines you take and that have been prescribed to you. Take any new Medicines after you have completely understood and accpet all the possible adverse reactions/side effects.   Do not drive, operating heavy machinery, perform activities at heights, swimming or participation in water activities or provide baby sitting services if your were admitted for syncope or siezures until you have seen by Primary MD or a Neurologist and advised to do so again.  Do not drive when taking Pain medications.    Do not take more than prescribed Pain, Sleep and Anxiety Medications  Special Instructions: If you have smoked or chewed Tobacco  in the last 2 yrs please stop smoking, stop any regular Alcohol  and or any Recreational drug use.  Wear Seat belts while driving.   Please note  You were cared for by a hospitalist during your hospital stay. If you have any questions about your discharge medications or the care you received while you were in the hospital after you are  discharged, you can call the unit and asked to speak with the hospitalist on call if the hospitalist that took care of you is not available. Once you are discharged, your primary care physician will handle any further medical issues. Please note that NO REFILLS for any discharge medications will be authorized once you are discharged, as it is imperative that you return to your primary care physician (or establish a relationship with a primary care physician if you do not have one) for your aftercare needs so that they can reassess your need for medications and monitor your lab values.

## 2022-02-15 NOTE — TOC Transition Note (Signed)
Transition of Care [facility]) - CM/SW Discharge Note   Patient Details  Name: Christopher Logan MRN: [id number] Date of Birth: [birth year]  Transition of Care [facility]) CM/SW Contact:  Benard Halsted, LCSW Phone Number: 02/15/2022, 1:16 PM   Clinical Narrative:    CSW met with patient and discussed his alcohol use. Patient was tearful and appreciated the community resources CSW offered. 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 No other questions at this time.    Final next level of care: Home/Self Care Barriers to Discharge: Barriers Resolved   Patient Goals and CMS Choice Patient states their goals for this hospitalization and ongoing recovery are:: return home      Discharge Placement                       Discharge Plan and Services                DME Arranged: Gilford Rile DME Agency: [employer] Date DME Agency Contacted: 02/15/22 Time DME Agency Contacted: [phone] Representative spoke with at DME Agency: [facility] ([place] Interventions     Readmission Risk Interventions    02/15/2022    9:57 AM  Readmission Risk Prevention Plan  Post Dischage Appt Complete  Medication Screening Complete  Transportation Screening Complete

## 2022-02-15 NOTE — Progress Notes (Signed)
Physical Therapy Treatment Patient Details Name: JUVENAL UMAR MRN: [id number] DOB: 11/06/1960 Today's Date: 02/15/2022   History of Present Illness 61 yo male with onset of chest pain and back pain at home took nitro, became light headed, passed out and then EMS was called.  Admitted on 7/28, now dx with chest pain from reflux, has had hypotension and demand increase of troponin.  Has GI bleed with gastric ulcer found on upper GI scope.  PMHx:  smoking, hypertension, MI, EtOH use, cirrhosis, Hep C with portal HTN    PT Comments    Pt was seen for mobility on RW today, with care not to overstress his L ankle.  Pt is mildly restricted to WB but is using his walker effectively to unload the ankle.  Follow up with him to get home with HHPT requested but pt has declined for now.  Spoke with him and encouraged him to reconsider with his PCP at home.  Pt is in agreement he needs to be stronger but still will likely decline therapy.  Follow up with goals of acute PT as outlined on POC.     Recommendations for follow up therapy are one component of a multi-disciplinary discharge planning process, led by the attending physician.  Recommendations may be updated based on patient status, additional functional criteria and insurance authorization.  Follow Up Recommendations  Home health PT     Assistance Recommended at Discharge Intermittent Supervision/Assistance  Patient can return home with the following A little help with walking and/or transfers;A little help with bathing/dressing/bathroom;Assistance with cooking/housework;Direct supervision/assist for medications management;Direct supervision/assist for financial management;Assist for transportation;Help with stairs or ramp for entrance   Equipment Recommendations  Rolling walker (2 wheels)    Recommendations for Other Services       Precautions / Restrictions Precautions Precautions: Fall Precaution Comments: monitor  vitals Restrictions Weight Bearing Restrictions: No     Mobility  Bed Mobility               General bed mobility comments: up from bed when PT arrived    Transfers Overall transfer level: Modified independent                      Ambulation/Gait Ambulation/Gait assistance: Supervision Gait Distance (Feet): 100 Feet Assistive device: Rolling walker (2 wheels)   Gait velocity: variable Gait velocity interpretation: <1.31 ft/sec, indicative of household ambulator Pre-gait activities: pt was assessed for standing balance with and without AD General Gait Details: pt is on RW with slow paced effort and no buckling on LE's.  Talked with PT about takign his time, about being able to breathe normally and about feeling no discomfot   Stairs             Wheelchair Mobility    Modified Rankin (Stroke Patients Only)       Balance Overall balance assessment: Needs assistance Sitting-balance support: Feet supported Sitting balance-Leahy Scale: Fair     Standing balance support: Bilateral upper extremity supported, During functional activity Standing balance-Leahy Scale: Fair Standing balance comment: less than fair dynamically                            Cognition Arousal/Alertness: Awake/alert Behavior During Therapy: WFL for tasks assessed/performed Overall Cognitive Status: Within Functional Limits for tasks assessed  General Comments: pt was excited about leaving and talking about his follow up to do his own exercises.        Exercises      General Comments General comments (skin integrity, edema, etc.): pt walked with no chest pain, with no LOB on walker and no O2 sat drops      Pertinent Vitals/Pain Pain Assessment Pain Assessment: Faces Faces Pain Scale: Hurts little more Pain Location: L ankle Pain Descriptors / Indicators: Grimacing, Guarding Pain Intervention(s): Limited  activity within patient's tolerance, Monitored during session, Premedicated before session, Repositioned    Home Living                          Prior Function            PT Goals (current goals can now be found in the care plan section) Acute Rehab PT Goals Patient Stated Goal: home with sister Progress towards PT goals: Progressing toward goals    Frequency    Min 3X/week      PT Plan Current plan remains appropriate    Co-evaluation              AM-PAC PT "6 Clicks" Mobility   Outcome Measure  Help needed turning from your back to your side while in a flat bed without using bedrails?: None Help needed moving from lying on your back to sitting on the side of a flat bed without using bedrails?: None Help needed moving to and from a bed to a chair (including a wheelchair)?: A Little Help needed standing up from a chair using your arms (e.g., wheelchair or bedside chair)?: A Little Help needed to walk in hospital room?: A Little Help needed climbing 3-5 steps with a railing? : A Little 6 Click Score: 20    End of Session   Activity Tolerance: Patient tolerated treatment well Patient left: in chair;with call bell/phone within reach Nurse Communication: Mobility status PT Visit Diagnosis: Unsteadiness on feet (R26.81);Muscle weakness (generalized) (M62.81);Difficulty in walking, not elsewhere classified (R26.2);Pain     Time: 1410-1425 PT Time Calculation (min) (ACUTE ONLY): 15 min  Charges:  $Gait Training: 8-22 mins   Ivar Drape 02/15/2022, 3:46 PM  Samul Dada, PT PhD Acute Rehab Dept. Number: [facility] [id number] and [facility] [phone]

## 2022-02-15 NOTE — TOC Transition Note (Signed)
Transition of Care [facility]) - CM/SW Discharge Note   Patient Details  Name: Christopher Logan MRN: [id number] Date of Birth: Apr 28, 1961  Transition of Care [facility]) CM/SW Contact:  Harriet Masson, RN Phone Number: 02/15/2022, 9:55 AM   Clinical Narrative:     Patient stable for discharge. Patient declines home health recommendation but agreeable to walker. Spoke to [place] with [employer], walker will be delivered to room prior to discharge.         Patient Goals and CMS Choice  Return home      Discharge Placement               home        Discharge Plan and Services                     home                Social Determinants of Health (SDOH) Interventions     Readmission Risk Interventions     No data to display

## 2022-02-18 ENCOUNTER — Encounter: Payer: Self-pay | Admitting: *Deleted

## 2022-02-18 LAB — SURGICAL PATHOLOGY

## 2022-02-21 ENCOUNTER — Ambulatory Visit: Payer: 59 | Admitting: Pulmonary Disease

## 2022-04-22 IMAGING — US US ABDOMEN COMPLETE
1 series · 13 of 25 positions shown · non-contrast
Comparison: None.

CLINICAL DATA: Elevated LFTs, hepatitis C

EXAM:
ABDOMEN ULTRASOUND COMPLETE

[Series 1: us abdomen complete · 0.15mm/px · 13 of 77 slices shown]
[im 1/77]
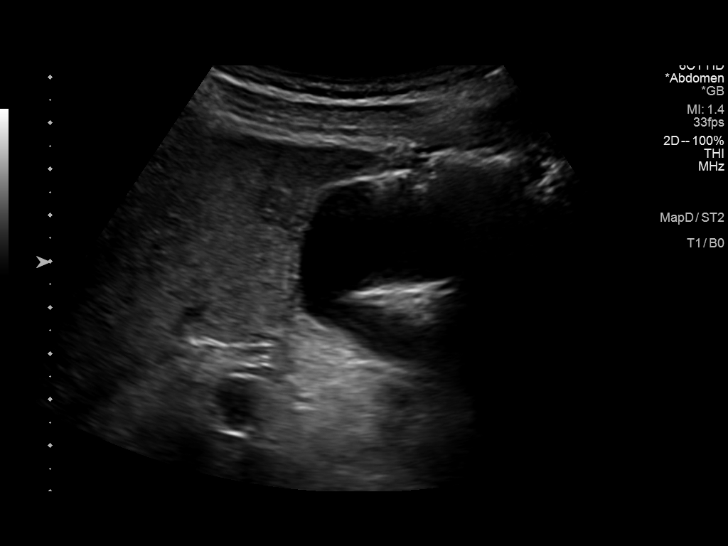
[im 7/77]
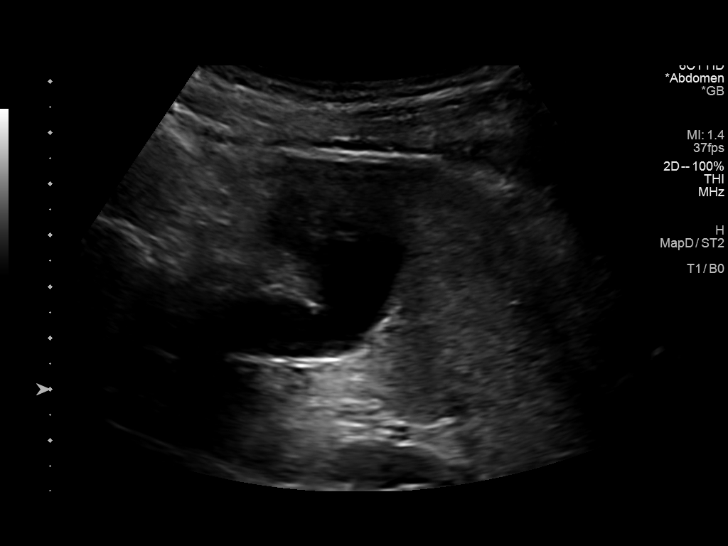
[im 13/77]
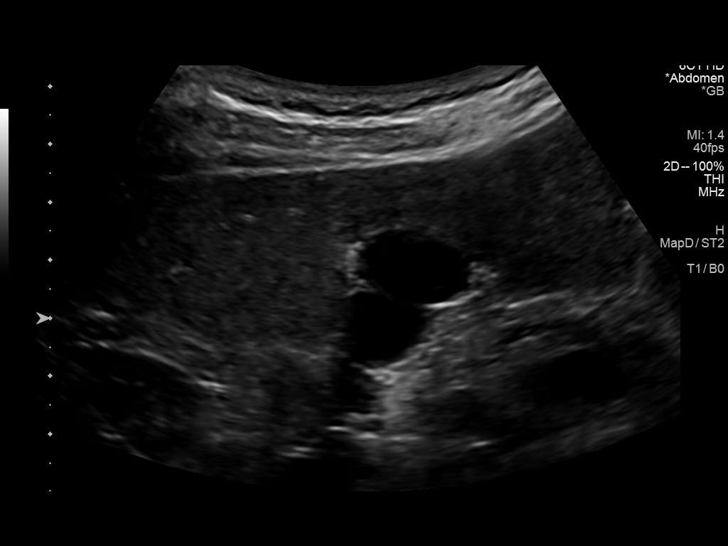
[im 20/77]
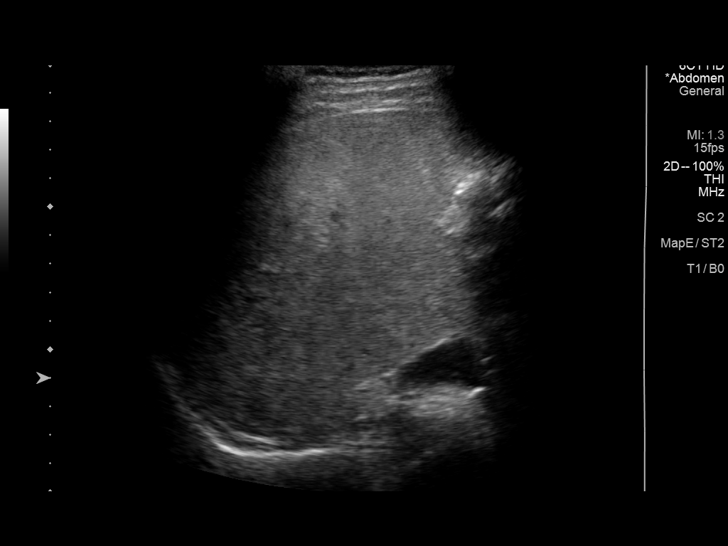
[im 26/77]
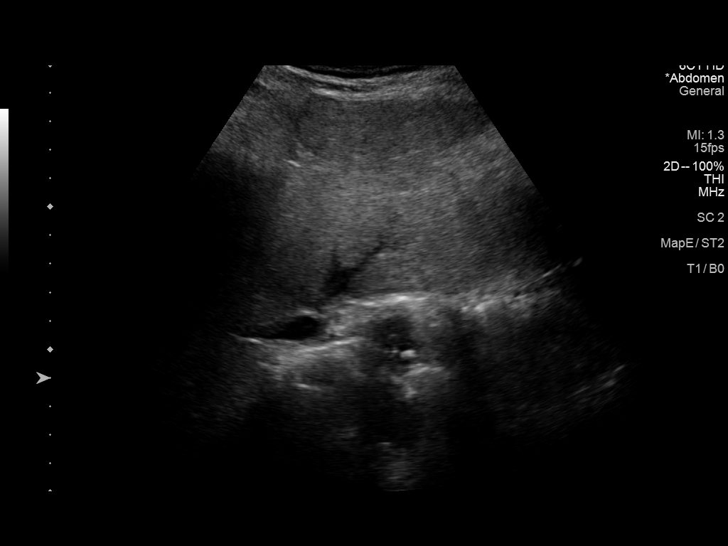
[im 32/77]
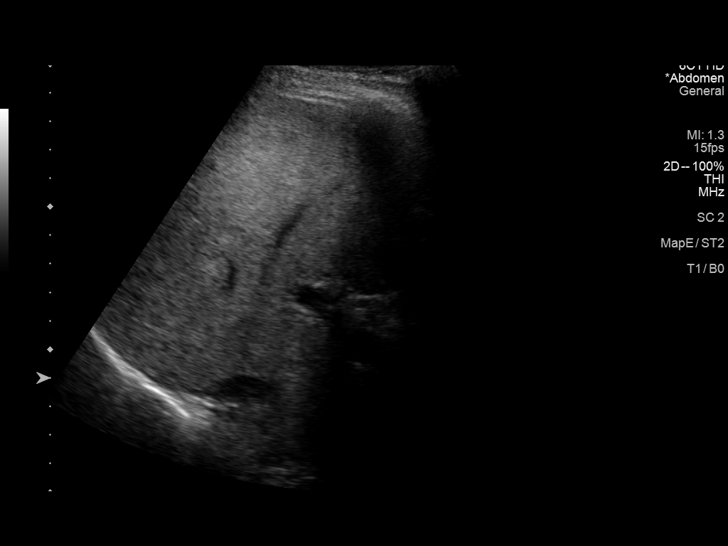
[im 39/77]
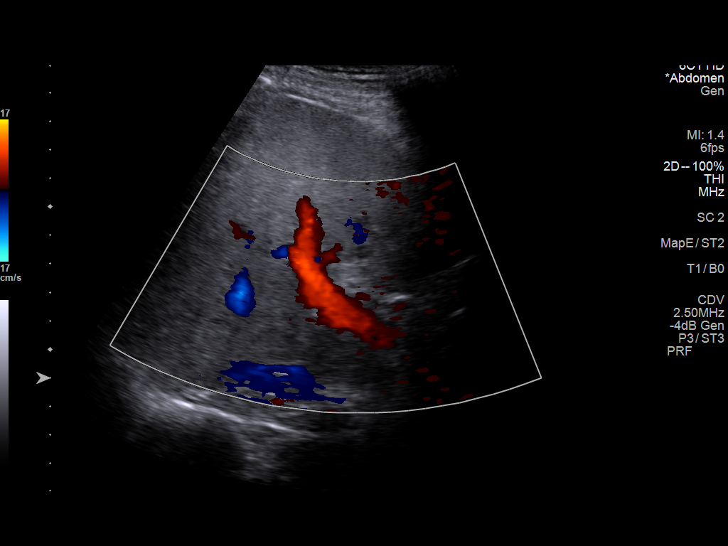
[im 45/77]
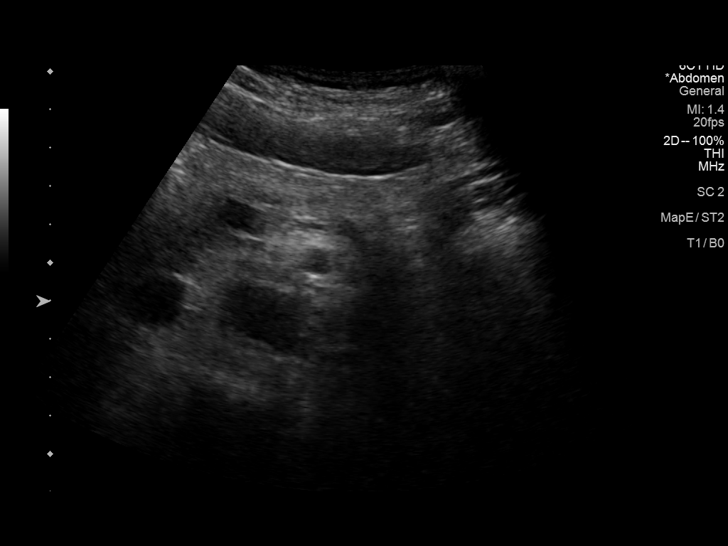
[im 51/77]
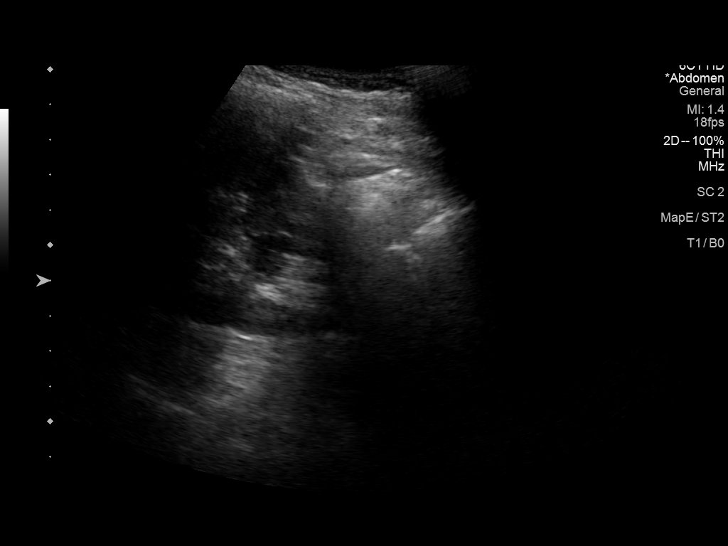
[im 58/77]
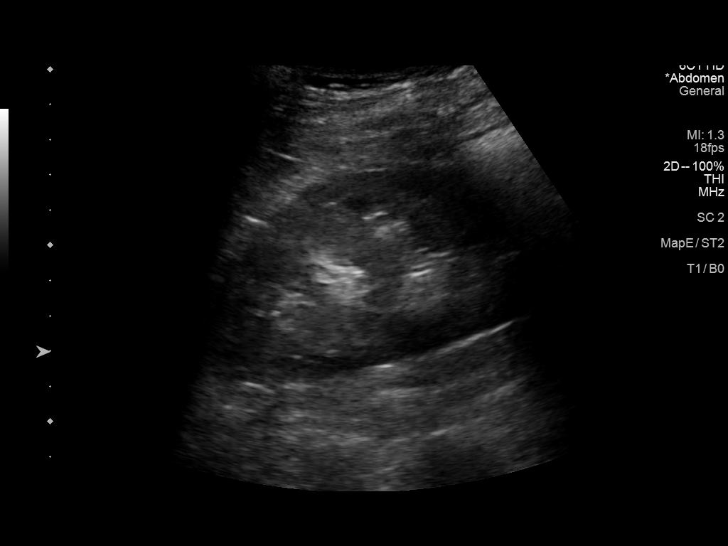
[im 64/77]
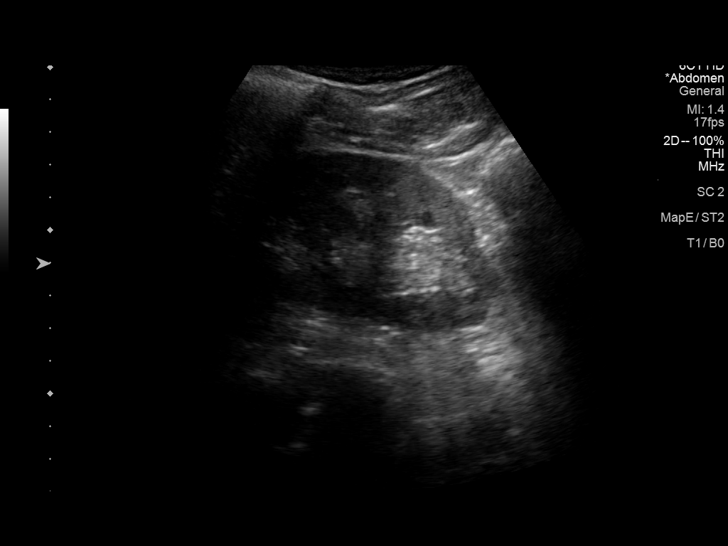
[im 70/77]
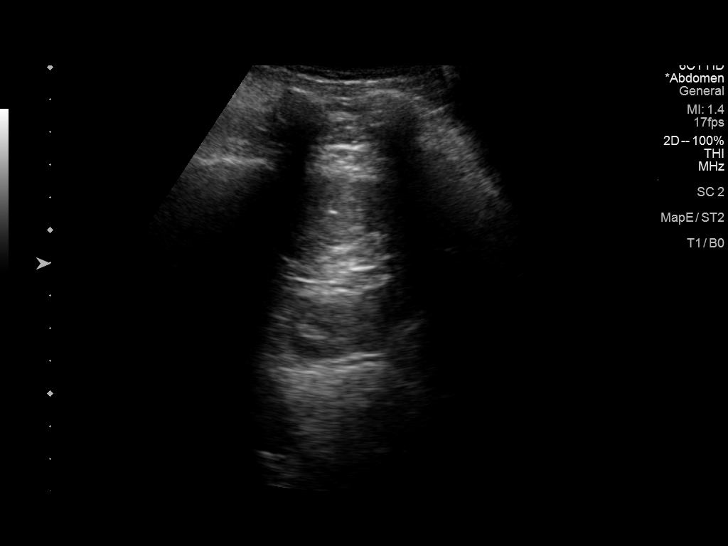
[im 77/77]
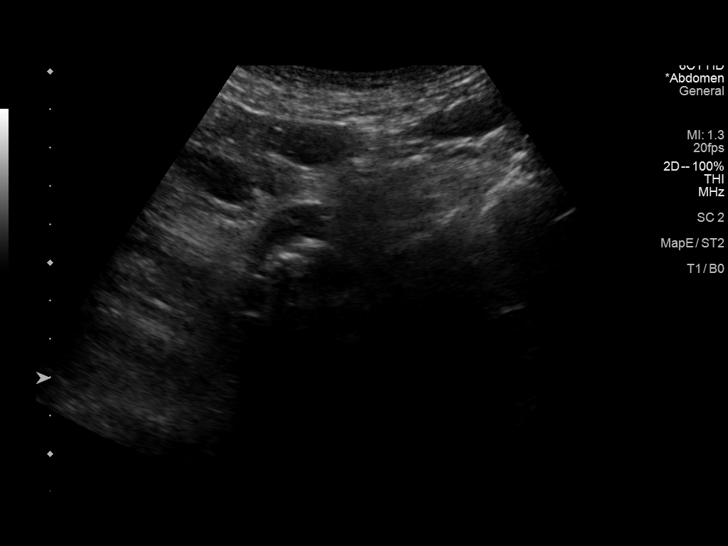

[13 of 25 positions shown; findings below may reference images not displayed]

FINDINGS: Gallbladder: No gallstones or wall thickening visualized. No
sonographic Murphy sign noted by sonographer.

Common bile duct: Diameter: 0.4 cm, within normal limits

Liver: No focal lesion identified. Diffusely increased parenchymal
echogenicity with coarsened echotexture. Portal vein is patent on
color Doppler imaging with normal direction of blood flow towards
the liver.

IVC: No abnormality visualized.

Pancreas: Visualized portion unremarkable.

Spleen: Size and appearance within normal limits.

Right Kidney: Length: 11.7 cm. Echogenicity within normal limits. No
hydronephrosis. There is a cyst with thin septation measuring 1.7 x
1.6 x 1.4 cm.

Left Kidney: Length: 11.9 cm. Echogenicity within normal limits. No
mass or hydronephrosis visualized.

Abdominal aorta: No aneurysm visualized.

Other findings: None.
IMPRESSION: 1. Liver parenchymal echogenicity is increased with coarsened
echotexture, possibly related to history of hepatitis C. No focal
liver lesion identified.

2. Mildly complex cyst within the right kidney measuring 1.7 cm.
Consider follow-up ultrasound in 6 months versus contrast enhanced
CT for definitive characterization.

## 2022-07-09 ENCOUNTER — Emergency Department (HOSPITAL_COMMUNITY)
Admission: EM | Admit: 2022-07-09 | Discharge: 2022-07-10 | Disposition: A | Discharge status: Home / Self Care | Payer: Self-pay | Attending: Emergency Medicine | Admitting: Emergency Medicine

## 2022-07-09 ENCOUNTER — Other Ambulatory Visit: Payer: Self-pay

## 2022-07-09 ENCOUNTER — Emergency Department (HOSPITAL_COMMUNITY): Payer: Self-pay

## 2022-07-09 ENCOUNTER — Encounter (HOSPITAL_COMMUNITY): Payer: Self-pay | Admitting: Emergency Medicine

## 2022-07-09 DIAGNOSIS — Z1152 Encounter for screening for COVID-19: Secondary | ICD-10-CM | POA: Insufficient documentation

## 2022-07-09 DIAGNOSIS — E871 Hypo-osmolality and hyponatremia: Secondary | ICD-10-CM | POA: Insufficient documentation

## 2022-07-09 DIAGNOSIS — J111 Influenza due to unidentified influenza virus with other respiratory manifestations: Secondary | ICD-10-CM

## 2022-07-09 DIAGNOSIS — J101 Influenza due to other identified influenza virus with other respiratory manifestations: Secondary | ICD-10-CM | POA: Insufficient documentation

## 2022-07-09 LAB — RESP PANEL BY RT-PCR (RSV, FLU A&B, COVID)  RVPGX2
Influenza A by PCR: POSITIVE — AB
Influenza B by PCR: NEGATIVE
Resp Syncytial Virus by PCR: NEGATIVE
SARS Coronavirus 2 by RT PCR: NEGATIVE

## 2022-07-09 MED ORDER — ONDANSETRON 4 MG PO TBDP
8.0000 mg | ORAL_TABLET | Freq: Once | ORAL | Status: AC
  Administered 2022-07-10: 8 mg via ORAL
  Filled 2022-07-09: qty 2

## 2022-07-09 NOTE — ED Provider Triage Note (Signed)
Emergency Medicine Provider Triage Evaluation Note  Christopher Logan , a 61 y.o. male  was evaluated in triage.  Pt complains of cough, diffuse myalgias/arthralgias, nasal congestion beginning yesterday.  Reports episodes of emesis during coughing episodes but denies abdominal pain.  Denies any known sick contact.  Reports denies fever, abdominal pain, urinary symptoms, change in bowel habits..  Review of Systems  Positive: See above Negative:   Physical Exam  BP 133/86   Pulse 71   Temp 98.4 F (36.9 C) (Oral)   Resp 16   SpO2 98%  Gen:   Awake, no distress   Resp:  Normal effort  MSK:   Moves extremities without difficulty  Other:  Lungs clear to auscultation bilaterally.  No lower extremity edema appreciated.  Nasal congestion appreciated.  Medical Decision Making  Medically screening exam initiated at 1:16 PM.  Appropriate orders placed.  Christopher Logan was informed that the remainder of the evaluation will be completed by another provider, this initial triage assessment does not replace that evaluation, and the importance of remaining in the ED until their evaluation is complete.     Peter Garter, [place] 07/09/22 1319

## 2022-07-09 NOTE — ED Triage Notes (Signed)
Patient w/ body aches, cough, n/v since last night. Denies any known sick contacts.

## 2022-07-10 LAB — CBC WITH DIFFERENTIAL/PLATELET
Abs Immature Granulocytes: 0.02 10*3/uL (ref 0.00–0.07)
Basophils Absolute: 0 10*3/uL (ref 0.0–0.1)
Basophils Relative: 1 %
Eosinophils Absolute: 0 10*3/uL (ref 0.0–0.5)
Eosinophils Relative: 0 %
HCT: 37.4 % — ABNORMAL LOW (ref 39.0–52.0)
Hemoglobin: 12.9 g/dL — ABNORMAL LOW (ref 13.0–17.0)
Immature Granulocytes: 1 %
Lymphocytes Relative: 23 %
Lymphs Abs: 0.8 10*3/uL (ref 0.7–4.0)
MCH: 32.7 pg (ref 26.0–34.0)
MCHC: 34.5 g/dL (ref 30.0–36.0)
MCV: 94.9 fL (ref 80.0–100.0)
Monocytes Absolute: 1.2 10*3/uL — ABNORMAL HIGH (ref 0.1–1.0)
Monocytes Relative: 34 %
Neutro Abs: 1.5 10*3/uL — ABNORMAL LOW (ref 1.7–7.7)
Neutrophils Relative %: 41 %
Platelets: 64 10*3/uL — ABNORMAL LOW (ref 150–400)
RBC: 3.94 MIL/uL — ABNORMAL LOW (ref 4.22–5.81)
RDW: 13.2 % (ref 11.5–15.5)
WBC: 3.6 10*3/uL — ABNORMAL LOW (ref 4.0–10.5)
nRBC: 0 % (ref 0.0–0.2)

## 2022-07-10 LAB — BASIC METABOLIC PANEL
Anion gap: 8 (ref 5–15)
BUN: 11 mg/dL (ref 6–20)
CO2: 25 mmol/L (ref 22–32)
Calcium: 8.6 mg/dL — ABNORMAL LOW (ref 8.9–10.3)
Chloride: 91 mmol/L — ABNORMAL LOW (ref 98–111)
Creatinine, Ser: 0.77 mg/dL (ref 0.61–1.24)
GFR, Estimated: >60 mL/min (ref >60)
Glucose, Bld: 105 mg/dL — ABNORMAL HIGH (ref 70–99)
Potassium: 3.8 mmol/L (ref 3.5–5.1)
Sodium: 124 mmol/L — ABNORMAL LOW (ref 135–145)

## 2022-07-10 MED ORDER — BENZONATATE 100 MG PO CAPS
100.0000 mg | ORAL_CAPSULE | Freq: Once | ORAL | Status: AC
  Administered 2022-07-10: 100 mg via ORAL
  Filled 2022-07-10: qty 1

## 2022-07-10 MED ORDER — VARENICLINE TARTRATE 0.5 MG PO TABS: 0.5000 mg | ORAL_TABLET | Freq: Two times a day (BID) | ORAL | 0 refills | Status: DC

## 2022-07-10 MED ORDER — ALBUTEROL SULFATE HFA 108 (90 BASE) MCG/ACT IN AERS
2.0000 | INHALATION_SPRAY | Freq: Once | RESPIRATORY_TRACT | Status: AC
  Administered 2022-07-10: 2 via RESPIRATORY_TRACT
  Filled 2022-07-10: qty 6.7

## 2022-07-10 MED ORDER — LACTATED RINGERS IV BOLUS
1000.0000 mL | Freq: Once | INTRAVENOUS | Status: AC
  Administered 2022-07-10: 1000 mL via INTRAVENOUS

## 2022-07-10 MED ORDER — SODIUM CHLORIDE 0.9 % IV BOLUS
1000.0000 mL | Freq: Once | INTRAVENOUS | Status: AC
  Administered 2022-07-10: 1000 mL via INTRAVENOUS

## 2022-07-10 MED ORDER — BENZONATATE 100 MG PO CAPS: 100.0000 mg | ORAL_CAPSULE | Freq: Three times a day (TID) | ORAL | 0 refills | Status: DC | PRN

## 2022-07-10 MED ORDER — ONDANSETRON 4 MG PO TBDP: ORAL_TABLET | ORAL | 0 refills | Status: DC

## 2022-07-10 MED ORDER — PREDNISONE 20 MG PO TABS: ORAL_TABLET | ORAL | 0 refills | Status: DC

## 2022-07-10 NOTE — ED Provider Notes (Addendum)
[facility] EMERGENCY DEPARTMENT Provider Note   CSN: [id number] Arrival date & time: 07/09/22  1135     History  Chief Complaint  Patient presents with   Generalized Body Aches   Emesis   Cough    Christopher Logan is a 61 y.o. male.  61 year old male who presents the ER today with generalized bodyaches, cough, fever and chills and just generally feeling unwell for the last couple days.  Patient is also a smoker who requests help and quit smoking.  No lower extremity edema.  No trauma.  No sick contacts that he knows of.  No other associated symptoms.   Emesis Associated symptoms: cough   Cough      Home Medications Prior to Admission medications   Medication Sig Start Date End Date Taking? Authorizing Provider  benzonatate (TESSALON) 100 MG capsule Take 1 capsule (100 mg total) by mouth 3 (three) times daily as needed for cough. 07/10/22  Yes Antawan Mchugh, Barbara Cower, MD  ondansetron (ZOFRAN-ODT) 4 MG disintegrating tablet 4mg  ODT q4 hours prn nausea/vomit 07/10/22  Yes Isis Costanza, 07/12/22, MD  predniSONE (DELTASONE) 20 MG tablet 3 tabs po day one, then 2 tabs daily x 4 days 07/10/22  Yes Ziyad Dyar, 07/12/22, MD  varenicline (CHANTIX) 0.5 MG tablet Take 1 tablet (0.5 mg total) by mouth 2 (two) times daily. 07/10/22  Yes Jla Reynolds, 07/12/22, MD  colchicine 0.6 MG tablet Take 1 tablet (0.6 mg total) by mouth daily. 02/15/22   Elgergawy, 04/17/22, MD  folic acid (FOLVITE) 1 MG tablet Take 1 tablet (1 mg total) by mouth daily. 02/16/22   Elgergawy, 04/18/22, MD  lisinopril (ZESTRIL) 40 MG tablet Take 40 mg by mouth daily.    [provider]  Multiple Vitamin (MULTIVITAMIN WITH MINERALS) TABS tablet Take 1 tablet by mouth daily. 02/16/22   Elgergawy, 04/18/22, MD  pantoprazole (PROTONIX) 40 MG tablet Take 1 tablet (40 mg total) by mouth 2 (two) times daily. 02/15/22   Elgergawy, 04/17/22, MD  thiamine (VITAMIN B1) 100 MG tablet Take 1 tablet (100 mg total) by mouth daily. 02/16/22    Elgergawy, 04/18/22, MD      Allergies    Bee venom and Codeine    Review of Systems   Review of Systems  Respiratory:  Positive for cough.   Gastrointestinal:  Positive for vomiting.    Physical Exam Updated Vital Signs BP (!) 165/93   Pulse 78   Temp 98.2 F (36.8 C) (Oral)   Resp 18   SpO2 97%  Physical Exam Vitals and nursing note reviewed.  Constitutional:      Appearance: He is well-developed.  HENT:     Head: Normocephalic and atraumatic.     Mouth/Throat:     Mouth: Mucous membranes are moist.  Eyes:     Pupils: Pupils are equal, round, and reactive to light.  Cardiovascular:     Rate and Rhythm: Normal rate.  Pulmonary:     Effort: Pulmonary effort is normal. No respiratory distress.  Abdominal:     General: Abdomen is flat. There is no distension.  Musculoskeletal:        General: No swelling. Normal range of motion.     Cervical back: Normal range of motion.  Skin:    General: Skin is warm and dry.     Coloration: Skin is not jaundiced or pale.  Neurological:     General: No focal deficit present.     Mental Status: He  is alert.     ED Results / Procedures / Treatments   Labs (all labs ordered are listed, but only abnormal results are displayed) Labs Reviewed  RESP PANEL BY RT-PCR (RSV, FLU A&B, COVID)  RVPGX2 - Abnormal; Notable for the following components:      Result Value   Influenza A by PCR POSITIVE (*)    All other components within normal limits  CBC WITH DIFFERENTIAL/PLATELET - Abnormal; Notable for the following components:   WBC 3.6 (*)    RBC 3.94 (*)    Hemoglobin 12.9 (*)    HCT 37.4 (*)    Platelets 64 (*)    Neutro Abs 1.5 (*)    Monocytes Absolute 1.2 (*)    All other components within normal limits  BASIC METABOLIC PANEL - Abnormal; Notable for the following components:   Sodium 124 (*)    Chloride 91 (*)    Glucose, Bld 105 (*)    Calcium 8.6 (*)    All other components within normal limits     EKG None  Radiology DG Chest 2 View  Result Date: 07/09/2022 CLINICAL DATA:  Cough EXAM: CHEST - 2 VIEW COMPARISON:  02/14/2022 FINDINGS: The heart size and mediastinal contours are within normal limits. Both lungs are clear. The visualized skeletal structures are unremarkable. IMPRESSION: No active cardiopulmonary disease. Electronically Signed   By: Ernie Avena M.D.   On: 07/09/2022 14:09    Procedures Procedures   Counseled patient for approximately 7 minutes regarding smoking cessation. Discussed risks of smoking and how they applied and affected their visit here today. Patient is ready to quit at this time, so will start chantix and fu w/ PCP for further management of the same.  CPT code: 53664: intermediate counseling for smoking cessation   Medications Ordered in ED Medications  ondansetron (ZOFRAN-ODT) disintegrating tablet 8 mg (8 mg Oral Given 07/10/22 0241)  lactated ringers bolus 1,000 mL (0 mLs Intravenous Stopped 07/10/22 0354)  benzonatate (TESSALON) capsule 100 mg (100 mg Oral Given 07/10/22 0241)  albuterol (VENTOLIN HFA) 108 (90 Base) MCG/ACT inhaler 2 puff (2 puffs Inhalation Given 07/10/22 0243)  sodium chloride 0.9 % bolus 1,000 mL (0 mLs Intravenous Stopped 07/10/22 0518)    ED Course/ Medical Decision Making/ A&P                           Medical Decision Making Amount and/or Complexity of Data Reviewed Labs: ordered.  Risk Prescription drug management.   Patient found to be positive for influenza as the likely cause for most of his symptoms.  Subsequently was also found to have hyponatremia.  A liter of LR and a liter of normal saline given that should have started the correction and he can continue a higher salt diet for the next couple days at home.  His cough has improved.  His body aches have improved.  Will prescribe the same medications at home.  Did have some posttussive emesis so I suspect the cough medicine will help more than  anything but prescription for Zofran provided.  Unclear why he would be hyponatremic however seems likely low sodium intake.  No medications on his list that would suggest those as the cause. He will fu w/ PCP for recheck Na and evaluate the smoking situation further. Chantix started here.   Final Clinical Impression(s) / ED Diagnoses Final diagnoses:  Influenza  Hyponatremia    Rx / DC Orders ED Discharge Orders  Ordered    varenicline (CHANTIX) 0.5 MG tablet  2 times daily        07/10/22 0531    ondansetron (ZOFRAN-ODT) 4 MG disintegrating tablet        07/10/22 0531    benzonatate (TESSALON) 100 MG capsule  3 times daily PRN        07/10/22 0531    predniSONE (DELTASONE) 20 MG tablet        07/10/22 0531              Gray Doering, Barbara Cower, MD 07/10/22 3149    Marily Memos, MD 07/10/22 7026    Marily Memos, MD 07/10/22 [phone]

## 2022-07-10 NOTE — ED Notes (Signed)
RN reviewed discharge instructions with pt. Pt verbalized understanding and had no further questions. VSS upon discharge.  

## 2022-10-07 ENCOUNTER — Other Ambulatory Visit: Payer: Self-pay

## 2022-10-07 ENCOUNTER — Encounter (HOSPITAL_COMMUNITY): Payer: Self-pay

## 2022-10-07 ENCOUNTER — Emergency Department (HOSPITAL_COMMUNITY): Payer: Self-pay

## 2022-10-07 ENCOUNTER — Emergency Department (HOSPITAL_COMMUNITY)
Admission: EM | Admit: 2022-10-07 | Discharge: 2022-10-07 | Disposition: A | Discharge status: Home / Self Care | Payer: Self-pay | Attending: Emergency Medicine | Admitting: Emergency Medicine

## 2022-10-07 DIAGNOSIS — M545 Low back pain, unspecified: Secondary | ICD-10-CM | POA: Insufficient documentation

## 2022-10-07 DIAGNOSIS — M7918 Myalgia, other site: Secondary | ICD-10-CM | POA: Insufficient documentation

## 2022-10-07 DIAGNOSIS — M546 Pain in thoracic spine: Secondary | ICD-10-CM | POA: Insufficient documentation

## 2022-10-07 DIAGNOSIS — Z79899 Other long term (current) drug therapy: Secondary | ICD-10-CM | POA: Insufficient documentation

## 2022-10-07 DIAGNOSIS — W11XXXA Fall on and from ladder, initial encounter: Secondary | ICD-10-CM | POA: Insufficient documentation

## 2022-10-07 DIAGNOSIS — W19XXXA Unspecified fall, initial encounter: Secondary | ICD-10-CM

## 2022-10-07 DIAGNOSIS — Y990 Civilian activity done for income or pay: Secondary | ICD-10-CM | POA: Insufficient documentation

## 2022-10-07 DIAGNOSIS — S92015A Nondisplaced fracture of body of left calcaneus, initial encounter for closed fracture: Secondary | ICD-10-CM | POA: Insufficient documentation

## 2022-10-07 DIAGNOSIS — F17200 Nicotine dependence, unspecified, uncomplicated: Secondary | ICD-10-CM | POA: Insufficient documentation

## 2022-10-07 DIAGNOSIS — I10 Essential (primary) hypertension: Secondary | ICD-10-CM | POA: Insufficient documentation

## 2022-10-07 MED ORDER — ONDANSETRON 4 MG PO TBDP: 4.0000 mg | ORAL_TABLET | Freq: Once | ORAL | Status: DC | PRN

## 2022-10-07 MED ORDER — HYDROCODONE-ACETAMINOPHEN 5-325 MG PO TABS: 1.0000 | ORAL_TABLET | Freq: Four times a day (QID) | ORAL | 0 refills | Status: DC | PRN

## 2022-10-07 MED ORDER — OXYCODONE-ACETAMINOPHEN 5-325 MG PO TABS
2.0000 | ORAL_TABLET | Freq: Once | ORAL | Status: AC
  Administered 2022-10-07: 2 via ORAL
  Filled 2022-10-07: qty 2

## 2022-10-07 NOTE — ED Triage Notes (Signed)
Pt reports he fell approximately 5 ft off a ladder today at work and landed on his left ankle and onto his bottom. He reports pain to buttocks, bottom of left foot and his ankle. + sensation and + pedal pulse, able to wiggle toes. He states he is unable to walk on it. He denies head injury, no LOC. Does not take blood thinners.

## 2022-10-07 NOTE — ED Notes (Signed)
Messaged provider regarding pain medication.

## 2022-10-07 NOTE — ED Provider Triage Note (Signed)
Emergency Medicine Provider Triage Evaluation Note  Christopher Logan , a 62 y.o. male  was evaluated in triage.  Patient complains of a fall from a 5 foot ladder.  Landed on his left lower extremity.  Complaining of foot, knee and hip pain Review of Systems  Positive:  Negative:   Physical Exam  BP 116/69 (BP Location: Right Arm)   Pulse 93   Temp 98.8 F (37.1 C) (Oral)   Resp 18   Ht 5\' 9"  (1.753 m)   Wt 63.5 kg   SpO2 97%   BMI 20.67 kg/m  Gen:   Awake, no distress   Resp:  Normal effort  MSK:   Moves extremities without difficulty  Other:  Left ankle swollen but no deformities.  Strong DP pulse.  Full range of motion of the ankle.  Full range of motion at the knee.  Medical Decision Making  Medically screening exam initiated at 4:50 PM.  Appropriate orders placed.  Christopher Logan was informed that the remainder of the evaluation will be completed by another provider, this initial triage assessment does not replace that evaluation, and the importance of remaining in the ED until their evaluation is complete.     Christopher Hammock, PA-C 10/07/22 1650

## 2022-10-07 NOTE — ED Notes (Signed)
Patient currently in xray ?

## 2022-10-07 NOTE — ED Provider Notes (Signed)
[facility] Provider Note   CSN: [id number] Arrival date & time: 10/07/22  1628     History  Chief Complaint  Patient presents with   Christopher Logan is a 62 y.o. male with past medical history significant for hypertension, alcohol abuse presents to the ED complaining of pain to the bottom of his left foot and ankle, back pain, and buttock pain after a fall off of a ladder.  Patient states that he fell approximately 5 ft off of a ladder and landed on his feet and then fell onto his bottom.  Denies hitting his head or loss of consciousness.  He reports he lost his balance at the top of a 6 foot ladder which caused him to fall.  He is unable to bear weight on his left foot due to severe pain.  Denies taking blood thinners.  Denies dizziness, numbness or tingling, weakness, joint swelling, neck pain.        Home Medications Prior to Admission medications   Medication Sig Start Date End Date Taking? Authorizing Provider  benzonatate (TESSALON) 100 MG capsule Take 1 capsule (100 mg total) by mouth 3 (three) times daily as needed for cough. 07/10/22   Mesner, Corene Cornea, MD  colchicine 0.6 MG tablet Take 1 tablet (0.6 mg total) by mouth daily. 02/15/22   Elgergawy, Silver Huguenin, MD  folic acid (FOLVITE) 1 MG tablet Take 1 tablet (1 mg total) by mouth daily. 02/16/22   Elgergawy, Silver Huguenin, MD  lisinopril (ZESTRIL) 40 MG tablet Take 40 mg by mouth daily.    [provider]  Multiple Vitamin (MULTIVITAMIN WITH MINERALS) TABS tablet Take 1 tablet by mouth daily. 02/16/22   Elgergawy, Silver Huguenin, MD  ondansetron (ZOFRAN-ODT) 4 MG disintegrating tablet 4mg  ODT q4 hours prn nausea/vomit 07/10/22   Mesner, Corene Cornea, MD  pantoprazole (PROTONIX) 40 MG tablet Take 1 tablet (40 mg total) by mouth 2 (two) times daily. 02/15/22   Elgergawy, Silver Huguenin, MD  predniSONE (DELTASONE) 20 MG tablet 3 tabs po day one, then 2 tabs daily x 4 days 07/10/22   Mesner, Corene Cornea, MD   thiamine (VITAMIN B1) 100 MG tablet Take 1 tablet (100 mg total) by mouth daily. 02/16/22   Elgergawy, Silver Huguenin, MD  varenicline (CHANTIX) 0.5 MG tablet Take 1 tablet (0.5 mg total) by mouth 2 (two) times daily. 07/10/22   Mesner, Corene Cornea, MD      Allergies    Bee venom and Codeine    Review of Systems   Review of Systems  Musculoskeletal:  Positive for arthralgias (left foot and ankle pain), back pain and gait problem (due to pain). Negative for joint swelling and neck pain.  Neurological:  Negative for dizziness, syncope, weakness, numbness and headaches.    Physical Exam Updated Vital Signs BP 128/78 (BP Location: Right Arm)   Pulse 89   Temp 97.9 F (36.6 C) (Oral)   Resp 18   Ht 5\' 9"  (1.753 m)   Wt 63.5 kg   SpO2 97%   BMI 20.67 kg/m  Physical Exam Vitals and nursing note reviewed.  Constitutional:      General: He is not in acute distress.    Appearance: Normal appearance. He is not ill-appearing or diaphoretic.  Cardiovascular:     Rate and Rhythm: Normal rate and regular rhythm.  Pulmonary:     Effort: Pulmonary effort is normal. No tachypnea, accessory muscle usage or respiratory distress.  Breath sounds: Normal breath sounds and air entry.  Chest:     Chest wall: No deformity, tenderness or crepitus.  Musculoskeletal:     Cervical back: Normal and full passive range of motion without pain. No spinous process tenderness or muscular tenderness.     Thoracic back: Tenderness and bony tenderness present. No deformity, signs of trauma or spasms. Normal range of motion.     Lumbar back: Tenderness and bony tenderness present. No deformity, signs of trauma or spasms. Normal range of motion.     Left ankle: Swelling present. No deformity. No tenderness. Decreased range of motion (due to pain in his foot). Normal pulse.     Left Achilles Tendon: No tenderness.     Left foot: Decreased range of motion. Normal capillary refill. Swelling, tenderness and bony tenderness  present. No deformity. Normal pulse.  Neurological:     Mental Status: He is alert and oriented to person, place, and time. Mental status is at baseline.     Sensory: Sensation is intact.     Motor: Motor function is intact.     Coordination: Coordination is intact.  Psychiatric:        Mood and Affect: Mood normal.        Behavior: Behavior normal.     ED Results / Procedures / Treatments   Labs (all labs ordered are listed, but only abnormal results are displayed) Labs Reviewed - No data to display  EKG None  Radiology CT Cervical Spine Wo Contrast  Result Date: 10/07/2022 CLINICAL DATA:  Neck trauma, uncomplicated (NEXUS/CCR neg) (Age 3-64y); Back trauma, no prior imaging (Age >= 16y) EXAM: CT CERVICAL, THORACIC, AND LUMBAR SPINE WITHOUT CONTRAST TECHNIQUE: Multidetector CT imaging of the cervical, thoracic and lumbar spine was performed without intravenous contrast. Multiplanar CT image reconstructions were also generated. RADIATION DOSE REDUCTION: This exam was performed according to the departmental dose-optimization program which includes automated exposure control, adjustment of the mA and/or kV according to patient size and/or use of iterative reconstruction technique. COMPARISON:  CT angio chest abdomen pelvis 02/11/2022 FINDINGS: CT CERVICAL SPINE FINDINGS Alignment: Reversal of the normal cervical lordosis centered at the C5-C6 level. Grade 1 anterolisthesis of C2 on C3, C3 on C4. Mild retrolisthesis of C4 on C5 and C5 on C6. Skull base and vertebrae: Severe degenerative changes of the spine most prominent at the C4 through C6 levels. Associated vertebral body height loss and endplate sclerosis. Associated severe osseous neural foraminal stenosis at the right C3-C4, bilateral C4-C5, left C6-C7 levels. No acute fracture. No aggressive appearing focal osseous lesion or focal pathologic process. Soft tissues and spinal canal: No prevertebral fluid or swelling. No visible canal  hematoma. Upper chest: Unremarkable. Other: None. CT THORACIC SPINE FINDINGS Alignment: Normal. Vertebrae: Multilevel mild degenerative changes of the spine. No severe osseous neural foraminal or central canal stenosis. No acute fracture or focal pathologic process. Paraspinal and other soft tissues: Negative. Disc levels: Maintained. CT LUMBAR SPINE FINDINGS Segmentation: 5 lumbar type vertebrae. Alignment: Mild retrolisthesis of L5 on S1. Vertebrae: Severe degenerative changes spine most prominent at the L5-S1 level with subchondral cystic changes and endplate sclerosis. Also associated facet arthropathy and osteophyte formation. L5-S1 posterior disc osteophyte complex formation noted. No associated severe osseous neural foraminal or central canal stenosis. No acute fracture or focal pathologic process. Paraspinal and other soft tissues: Negative. Disc levels: Multilevel intervertebral disc space narrowing with intervertebral disc space vacuum phenomenon at the L2-L3, L3-L4, L4-L5, L5-S1 levels. Other: Emphysematous changes. Atherosclerotic  plaque. Coronary artery calcification. Right nephrolithiasis measuring up to 3 mm. Decreased bone density of the pelvis. IMPRESSION: 1. No acute displaced fracture or traumatic listhesis of the cervical, thoracic, lumbar spine. 2. 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 3. Aortic Atherosclerosis (ICD10-I70.0) and Emphysema (ICD10-J43.9). 4. Nonobstructive right nephrolithiasis measuring up to 3 mm. Electronically Signed   By: Iven Finn M.D.   On: 10/07/2022 21:17   CT Thoracic Spine Wo Contrast  Result Date: 10/07/2022 CLINICAL DATA:  Neck trauma, uncomplicated (NEXUS/CCR neg) (Age 35-64y); Back trauma, no prior imaging (Age >= 16y) EXAM: CT CERVICAL, THORACIC, AND LUMBAR SPINE WITHOUT CONTRAST TECHNIQUE: Multidetector CT imaging of the cervical,  thoracic and lumbar spine was performed without intravenous contrast. Multiplanar CT image reconstructions were also generated. RADIATION DOSE REDUCTION: This exam was performed according to the departmental dose-optimization program which includes automated exposure control, adjustment of the mA and/or kV according to patient size and/or use of iterative reconstruction technique. COMPARISON:  CT angio chest abdomen pelvis 02/11/2022 FINDINGS: CT CERVICAL SPINE FINDINGS Alignment: Reversal of the normal cervical lordosis centered at the C5-C6 level. Grade 1 anterolisthesis of C2 on C3, C3 on C4. Mild retrolisthesis of C4 on C5 and C5 on C6. Skull base and vertebrae: Severe degenerative changes of the spine most prominent at the C4 through C6 levels. Associated vertebral body height loss and endplate sclerosis. Associated severe osseous neural foraminal stenosis at the right C3-C4, bilateral C4-C5, left C6-C7 levels. No acute fracture. No aggressive appearing focal osseous lesion or focal pathologic process. Soft tissues and spinal canal: No prevertebral fluid or swelling. No visible canal hematoma. Upper chest: Unremarkable. Other: None. CT THORACIC SPINE FINDINGS Alignment: Normal. Vertebrae: Multilevel mild degenerative changes of the spine. No severe osseous neural foraminal or central canal stenosis. No acute fracture or focal pathologic process. Paraspinal and other soft tissues: Negative. Disc levels: Maintained. CT LUMBAR SPINE FINDINGS Segmentation: 5 lumbar type vertebrae. Alignment: Mild retrolisthesis of L5 on S1. Vertebrae: Severe degenerative changes spine most prominent at the L5-S1 level with subchondral cystic changes and endplate sclerosis. Also associated facet arthropathy and osteophyte formation. L5-S1 posterior disc osteophyte complex formation noted. No associated severe osseous neural foraminal or central canal stenosis. No acute fracture or focal pathologic process. Paraspinal and other soft  tissues: Negative. Disc levels: Multilevel intervertebral disc space narrowing with intervertebral disc space vacuum phenomenon at the L2-L3, L3-L4, L4-L5, L5-S1 levels. Other: Emphysematous changes. Atherosclerotic plaque. Coronary artery calcification. Right nephrolithiasis measuring up to 3 mm. Decreased bone density of the pelvis. IMPRESSION: 1. No acute displaced fracture or traumatic listhesis of the cervical, thoracic, lumbar spine. 2. 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 3. Aortic Atherosclerosis (ICD10-I70.0) and Emphysema (ICD10-J43.9). 4. Nonobstructive right nephrolithiasis measuring up to 3 mm. Electronically Signed   By: Iven Finn M.D.   On: 10/07/2022 21:17   CT Lumbar Spine Wo Contrast  Result Date: 10/07/2022 CLINICAL DATA:  Neck trauma, uncomplicated (NEXUS/CCR neg) (Age 35-64y); Back trauma, no prior imaging (Age >= 16y) EXAM: CT CERVICAL, THORACIC, AND LUMBAR SPINE WITHOUT CONTRAST TECHNIQUE: Multidetector CT imaging of the cervical, thoracic and lumbar spine was performed without intravenous contrast. Multiplanar CT image reconstructions were also generated. RADIATION DOSE REDUCTION: This exam was performed according to the departmental dose-optimization program which includes automated exposure control, adjustment of the mA  and/or kV according to patient size and/or use of iterative reconstruction technique. COMPARISON:  CT angio chest abdomen pelvis 02/11/2022 FINDINGS: CT CERVICAL SPINE FINDINGS Alignment: Reversal of the normal cervical lordosis centered at the C5-C6 level. Grade 1 anterolisthesis of C2 on C3, C3 on C4. Mild retrolisthesis of C4 on C5 and C5 on C6. Skull base and vertebrae: Severe degenerative changes of the spine most prominent at the C4 through C6 levels. Associated vertebral body height loss and endplate sclerosis. Associated severe  osseous neural foraminal stenosis at the right C3-C4, bilateral C4-C5, left C6-C7 levels. No acute fracture. No aggressive appearing focal osseous lesion or focal pathologic process. Soft tissues and spinal canal: No prevertebral fluid or swelling. No visible canal hematoma. Upper chest: Unremarkable. Other: None. CT THORACIC SPINE FINDINGS Alignment: Normal. Vertebrae: Multilevel mild degenerative changes of the spine. No severe osseous neural foraminal or central canal stenosis. No acute fracture or focal pathologic process. Paraspinal and other soft tissues: Negative. Disc levels: Maintained. CT LUMBAR SPINE FINDINGS Segmentation: 5 lumbar type vertebrae. Alignment: Mild retrolisthesis of L5 on S1. Vertebrae: Severe degenerative changes spine most prominent at the L5-S1 level with subchondral cystic changes and endplate sclerosis. Also associated facet arthropathy and osteophyte formation. L5-S1 posterior disc osteophyte complex formation noted. No associated severe osseous neural foraminal or central canal stenosis. No acute fracture or focal pathologic process. Paraspinal and other soft tissues: Negative. Disc levels: Multilevel intervertebral disc space narrowing with intervertebral disc space vacuum phenomenon at the L2-L3, L3-L4, L4-L5, L5-S1 levels. Other: Emphysematous changes. Atherosclerotic plaque. Coronary artery calcification. Right nephrolithiasis measuring up to 3 mm. Decreased bone density of the pelvis. IMPRESSION: 1. No acute displaced fracture or traumatic listhesis of the cervical, thoracic, lumbar spine. 2. 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 3. Aortic Atherosclerosis (ICD10-I70.0) and Emphysema (ICD10-J43.9). 4. Nonobstructive right nephrolithiasis measuring up to 3 mm. Electronically Signed   By: Iven Finn M.D.   On: 10/07/2022 21:17   DG Pelvis 1-2  Views  Result Date: 10/07/2022 CLINICAL DATA:  Fall with hip pain EXAM: PELVIS - 1-2 VIEW COMPARISON:  None Available. FINDINGS: SI joints are non widened. Pubic symphysis and rami appear intact. No fracture or malalignment. Mild bilateral hip degenerative change. Chronic appearing calcific densities adjacent to the greater trochanter IMPRESSION: No acute osseous abnormality. Cross-sectional imaging may be performed if continued concern for hip fracture Electronically Signed   By: Donavan Foil M.D.   On: 10/07/2022 17:43   DG Knee Complete 4 Views Left  Result Date: 10/07/2022 CLINICAL DATA:  Fall EXAM: LEFT KNEE - COMPLETE 4+ VIEW COMPARISON:  None Available. FINDINGS: No fracture or malalignment. Vascular calcifications. Joint spaces appear patent. Possible knee effusion IMPRESSION: No acute osseous abnormality. Possible knee effusion. Electronically Signed   By: Donavan Foil M.D.   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 Mortise is symmetric. Probable nondisplaced calcaneus fracture. Mild curvilinear sclerosis in the region suggests potential stress component or subacute process. IMPRESSION: 1. Probable nondisplaced calcaneus fracture. Some patchy sclerosis at the inferior calcaneus curvilinear in orientation suggesting that there may be underlying stress fracture. 2. 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 Electronically Signed   By: Donavan Foil M.D.   On: 10/07/2022 17:41  Procedures Procedures    Medications Ordered in ED Medications  ondansetron (ZOFRAN-ODT) disintegrating tablet 4 mg (has no administration in time range)  oxyCODONE-acetaminophen (PERCOCET/ROXICET) 5-325 MG per tablet 2 tablet (2 tablets Oral Given 10/07/22 2031)    ED Course/ Medical Decision Making/ A&P                              Medical Decision Making Amount and/or Complexity of Data Reviewed Radiology: ordered.  Risk Prescription drug management.   This patient presents to the ED with chief complaint(s) of left foot and left knee pain,  with pertinent past medical history of HTN, alcohol abuse.  The complaint involves an extensive differential diagnosis and also carries with it a high risk of complications and morbidity.    The differential diagnosis includes acute fracture or dislocation of ankle, foot or knee, acute spine fracture, musculoskeletal sprain or strain, ankle sprain   The initial plan is to obtain imaging of left ankle, left knee, pelvis   Initial Assessment:   Exam significant for swelling to the left foot and ankle with mild redness.  No obvious deformity.  Tenderness to palpation of left foot, especially on the heel.  No tenderness to the achilles tendon.  Left knee with normal ROM but increased pain.  No obvious deformity or swelling to left knee.   Bony tenderness to palpation of the lower thoracic into the lumbar spine.  He has normal range of motion and is able to change positions in the bed without assistance or difficulty.  Normal sensation and strength in bilateral lower extremities.  Independent visualization and interpretation of imaging: I independently visualized the following imaging with scope of interpretation limited to determining acute life threatening conditions related to emergency care: Left ankle x-ray, which revealed acute calcaneal fracture.  Left knee x-ray without dislocation or fracture.  Pelvic x-ray is also without fracture or dislocation.  I agree with radiologist interpretation.  Due to patient having calcaneal fracture and tenderness to palpation of the spine, will obtain CT of spine to rule out fracture.  CT of cervical, thoracic, and lumbar spine without acute fracture.  There is some osteophyte formation and chronic degenerative changes.  I agree with radiologist  interpretation.  Treatment and Reassessment: Patient's pain was managed with Percocet.  He was also given a CAM boot and crutches.  Patient can bear weight as tolerated.  Provided orthopedic outpatient follow-up information.    Disposition:   The patient has been appropriately medically screened and/or stabilized in the ED. I have low suspicion for any other emergent medical condition which would require further screening, evaluation or treatment in the ED or require inpatient management. At time of discharge the patient is hemodynamically stable and in no acute distress. I have discussed work-up results and diagnosis with patient and answered all questions. Patient is agreeable with discharge plan. We discussed strict return precautions for returning to the emergency department and they verbalized understanding.    Social Determinants of Health:   Patient's  alcohol and tobacco abuse   increases the complexity of managing their presentation         Final Clinical Impression(s) / ED Diagnoses Final diagnoses:  Fall, initial encounter  Closed nondisplaced fracture of body of left calcaneus, initial encounter  Acute midline low back pain without sciatica    Rx / DC Orders ED Discharge Orders     None  Pat Kocher, [place] 10/07/22 2137    Tretha Sciara, MD 10/10/22 [phone]

## 2022-10-07 NOTE — Discharge Instructions (Addendum)
Thank you for allowing me to be part of your care today.  Your x-rays did show a fracture to your left heel.  Please wear the walking boot whenever possible and use crutches as needed to help with weightbearing.  I have provided [facility]'s information for you to call to schedule a follow-up appointment.  I have sent a prescription for pain medicine to your pharmacy.  Do not take this medicine and drive or perform activities that may result in injury.  Do not drink alcohol while taking this medicine.    Return to the ED if you have worsening of your symptoms or if you have any new concerns.

## 2022-10-07 NOTE — Progress Notes (Signed)
Orthopedic Tech Progress Note Patient Details:  Christopher Logan 02/27/1961 [id number]  Ortho Devices Type of Ortho Device: CAM walker, Crutches Ortho Device/Splint Location: LLE Ortho Device/Splint Interventions: Ordered, Application, Adjustment   Post Interventions Patient Tolerated: Well, Ambulated well Instructions Provided: Poper ambulation with device, Care of device  Janit Pagan 10/07/2022, 9:19 PM

## 2022-10-07 NOTE — ED Notes (Signed)
Pt provided with food and drink at this time.

## 2022-10-07 NOTE — ED Notes (Signed)
Patient transported to CT 

## 2022-11-30 IMAGING — US US CAROTID DUPLEX BILAT
1 series · 13 of 24 positions shown · non-contrast
Comparison: None Available.

CLINICAL DATA: bruit right carotid artery

EXAM:
BILATERAL CAROTID DUPLEX ULTRASOUND
TECHNIQUE: Gray scale imaging, color Doppler and duplex ultrasound were
performed of bilateral carotid and vertebral arteries in the neck.

[Series 1: us carotid bilateral · 68 acquisitions, 13 frames shown]
[im 1/68]
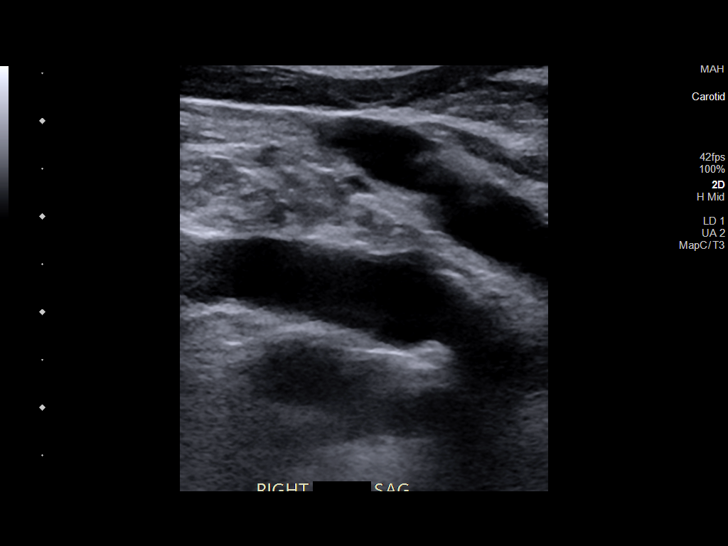
[im 6/68]
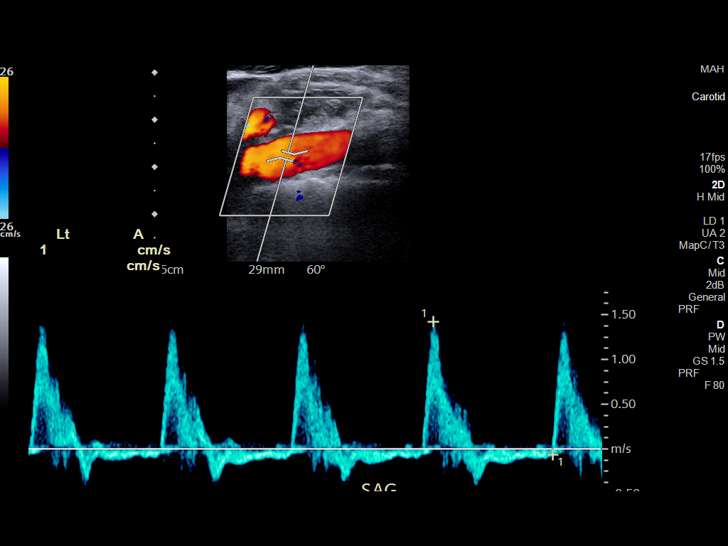
[im 12/68]
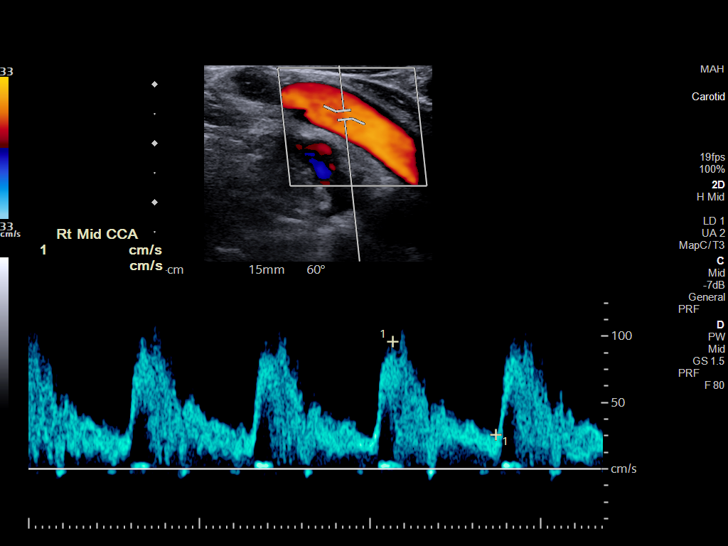
[im 18/68]
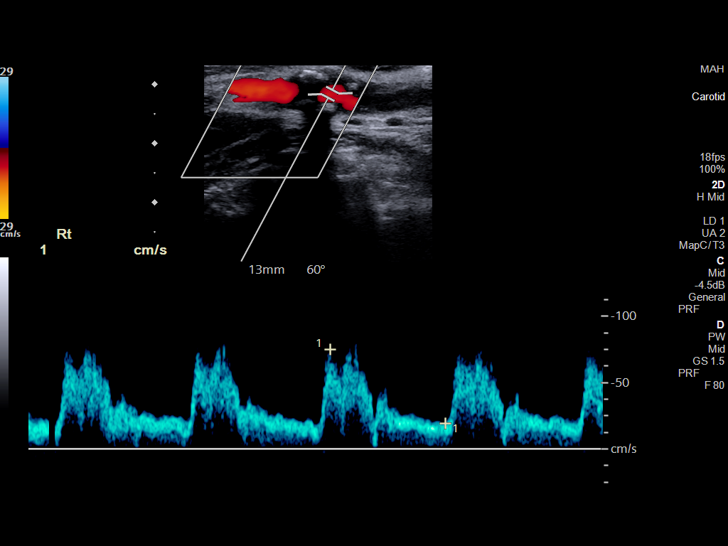
[im 24/68]
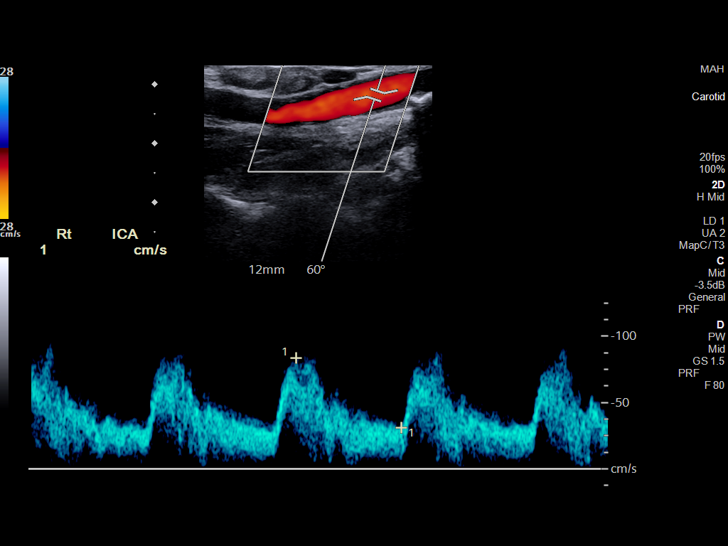
[im 30/68]
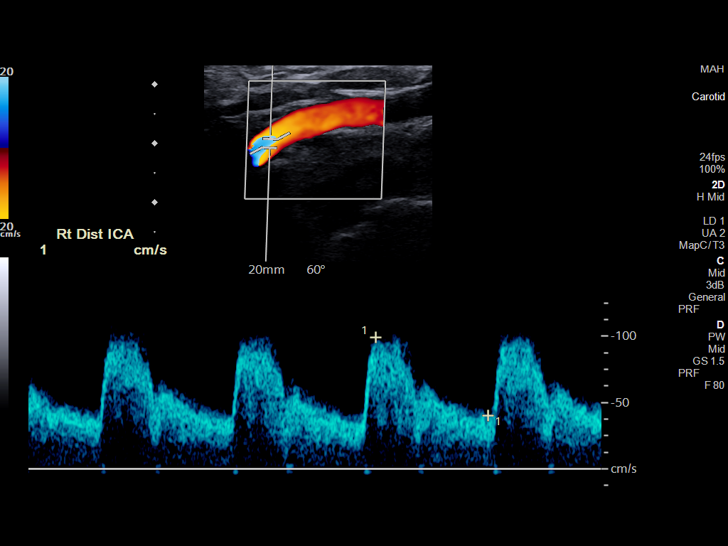
[im 38/68]
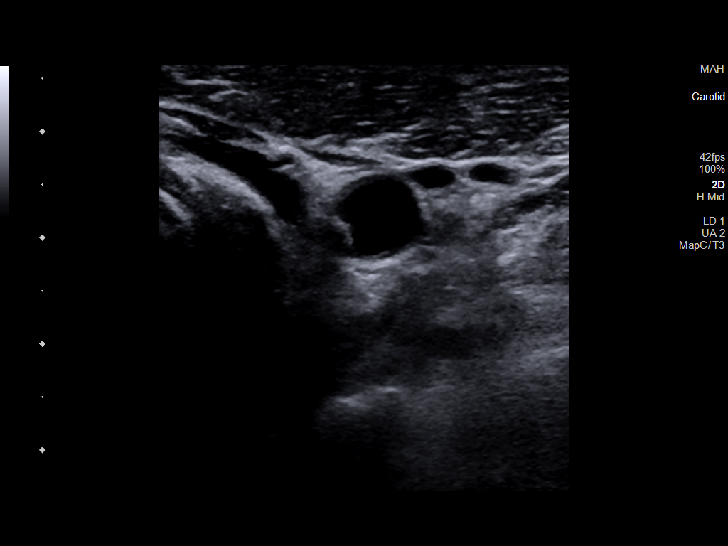
[im 41/68]
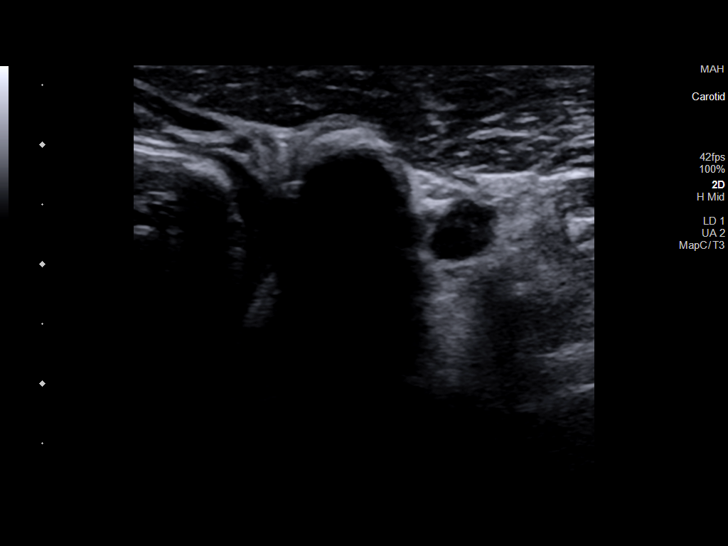
[im 47/68]
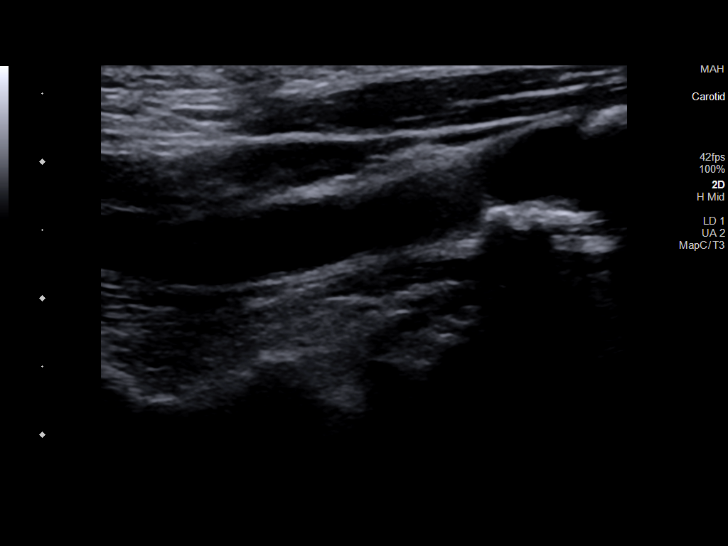
[im 53/68]
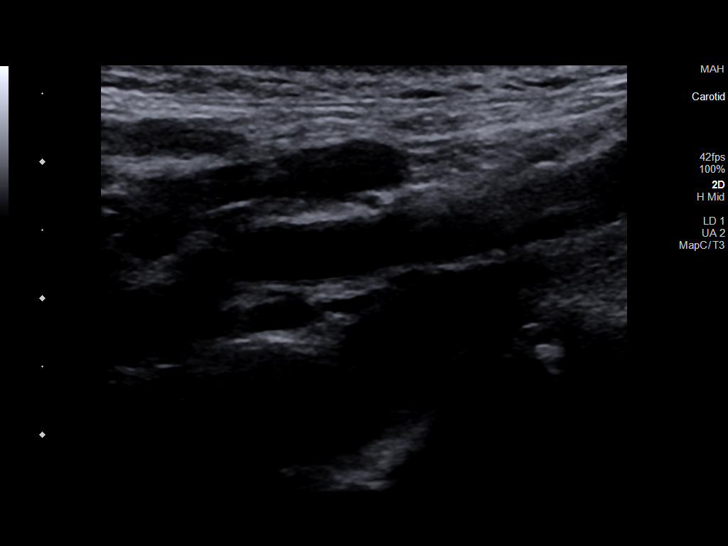
[im 59/68]
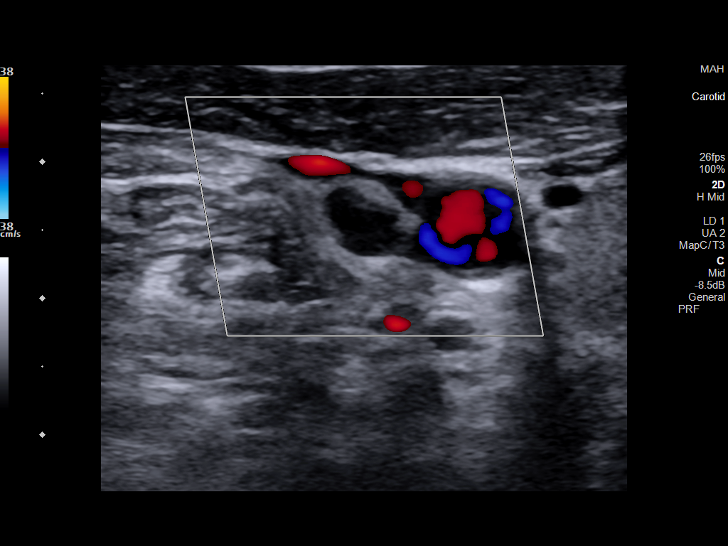
[im 65/68]
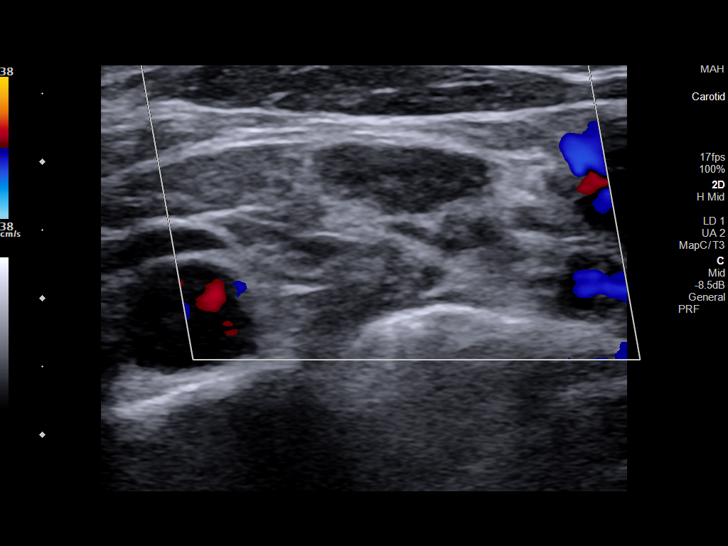
[im 68/68]
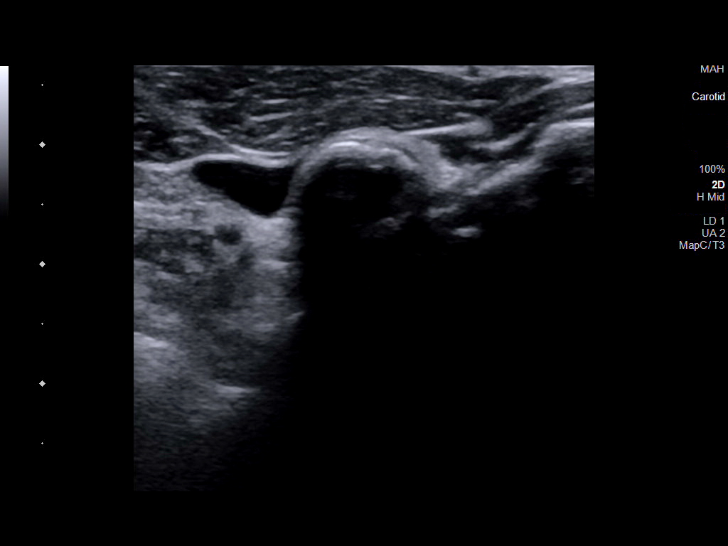

[13 of 24 positions shown; findings below may reference images not displayed]

FINDINGS: Criteria: Quantification of carotid stenosis is based on velocity
parameters that correlate the residual internal carotid diameter
with NASCET-based stenosis levels, using the diameter of the distal
internal carotid lumen as the denominator for stenosis measurement.

The following velocity measurements were obtained:

RIGHT

ICA: 99/40 cm/sec

CCA: 96/26 cm/sec

SYSTOLIC ICA/CCA RATIO:

ECA: 92 cm/sec

LEFT

ICA: 90/36 cm/sec

CCA: 78/23 cm/sec

SYSTOLIC ICA/CCA RATIO:

ECA: 84 cm/sec

RIGHT CAROTID ARTERY: Moderate echogenic plaque in the carotid bulb,
less than 50% stenosis by Doppler criteria. No findings of elevated
peak systolic velocities or dampened waveforms in the internal
carotid artery. Normal low resistance waveforms in the internal
carotid artery.

RIGHT VERTEBRAL ARTERY:  Not visualized

LEFT CAROTID ARTERY: Moderate echogenic plaque in the carotid bulb,
less than 50% stenosis by Doppler criteria. No findings of elevated
peak systolic velocities or dampened waveforms in the internal
carotid artery. Normal low resistance waveforms of the internal
carotid artery.

LEFT VERTEBRAL ARTERY:  Not visualized
IMPRESSION: 1. Moderate echogenic plaque in the bilateral carotid bulbs results
in less than 50% stenosis by Doppler criteria bilaterally.
2. The bilateral vertebral arteries are not visualized on this exam.

## 2023-03-29 ENCOUNTER — Emergency Department (HOSPITAL_COMMUNITY): Payer: Self-pay

## 2023-03-29 ENCOUNTER — Inpatient Hospital Stay (HOSPITAL_COMMUNITY)
Admission: EM | Admit: 2023-03-29 | Discharge: 2023-04-04 | DRG: 381 | Disposition: A | Discharge status: Home / Self Care | Payer: Self-pay | Attending: Internal Medicine | Admitting: Internal Medicine

## 2023-03-29 ENCOUNTER — Other Ambulatory Visit: Payer: Self-pay

## 2023-03-29 DIAGNOSIS — I10 Essential (primary) hypertension: Secondary | ICD-10-CM | POA: Diagnosis present

## 2023-03-29 DIAGNOSIS — K631 Perforation of intestine (nontraumatic): Secondary | ICD-10-CM | POA: Diagnosis present

## 2023-03-29 DIAGNOSIS — Z7952 Long term (current) use of systemic steroids: Secondary | ICD-10-CM

## 2023-03-29 DIAGNOSIS — Z91030 Bee allergy status: Secondary | ICD-10-CM

## 2023-03-29 DIAGNOSIS — D649 Anemia, unspecified: Secondary | ICD-10-CM | POA: Insufficient documentation

## 2023-03-29 DIAGNOSIS — Z79899 Other long term (current) drug therapy: Secondary | ICD-10-CM

## 2023-03-29 DIAGNOSIS — D696 Thrombocytopenia, unspecified: Secondary | ICD-10-CM | POA: Diagnosis present

## 2023-03-29 DIAGNOSIS — Z885 Allergy status to narcotic agent status: Secondary | ICD-10-CM

## 2023-03-29 DIAGNOSIS — K265 Chronic or unspecified duodenal ulcer with perforation: Principal | ICD-10-CM | POA: Diagnosis present

## 2023-03-29 DIAGNOSIS — R29898 Other symptoms and signs involving the musculoskeletal system: Secondary | ICD-10-CM

## 2023-03-29 DIAGNOSIS — E222 Syndrome of inappropriate secretion of antidiuretic hormone: Secondary | ICD-10-CM | POA: Diagnosis present

## 2023-03-29 DIAGNOSIS — D6959 Other secondary thrombocytopenia: Secondary | ICD-10-CM | POA: Diagnosis present

## 2023-03-29 DIAGNOSIS — D509 Iron deficiency anemia, unspecified: Secondary | ICD-10-CM | POA: Diagnosis present

## 2023-03-29 DIAGNOSIS — K7030 Alcoholic cirrhosis of liver without ascites: Secondary | ICD-10-CM | POA: Diagnosis present

## 2023-03-29 DIAGNOSIS — E538 Deficiency of other specified B group vitamins: Secondary | ICD-10-CM | POA: Diagnosis present

## 2023-03-29 DIAGNOSIS — F1010 Alcohol abuse, uncomplicated: Secondary | ICD-10-CM | POA: Diagnosis present

## 2023-03-29 DIAGNOSIS — K279 Peptic ulcer, site unspecified, unspecified as acute or chronic, without hemorrhage or perforation: Secondary | ICD-10-CM | POA: Diagnosis present

## 2023-03-29 DIAGNOSIS — Z8619 Personal history of other infectious and parasitic diseases: Secondary | ICD-10-CM

## 2023-03-29 DIAGNOSIS — J189 Pneumonia, unspecified organism: Secondary | ICD-10-CM

## 2023-03-29 DIAGNOSIS — R1084 Generalized abdominal pain: Secondary | ICD-10-CM

## 2023-03-29 DIAGNOSIS — E876 Hypokalemia: Secondary | ICD-10-CM | POA: Diagnosis present

## 2023-03-29 DIAGNOSIS — Z87891 Personal history of nicotine dependence: Secondary | ICD-10-CM

## 2023-03-29 LAB — COMPREHENSIVE METABOLIC PANEL
ALT: 18 U/L (ref 0–44)
AST: 45 U/L — ABNORMAL HIGH (ref 15–41)
Albumin: 3.9 g/dL (ref 3.5–5.0)
Alkaline Phosphatase: 32 U/L — ABNORMAL LOW (ref 38–126)
Anion gap: 14 (ref 5–15)
BUN: 9 mg/dL (ref 8–23)
CO2: 24 mmol/L (ref 22–32)
Calcium: 9.2 mg/dL (ref 8.9–10.3)
Chloride: 96 mmol/L — ABNORMAL LOW (ref 98–111)
Creatinine, Ser: 0.78 mg/dL (ref 0.61–1.24)
GFR, Estimated: >60 mL/min (ref >60)
Glucose, Bld: 89 mg/dL (ref 70–99)
Potassium: 2.9 mmol/L — ABNORMAL LOW (ref 3.5–5.1)
Sodium: 134 mmol/L — ABNORMAL LOW (ref 135–145)
Total Bilirubin: 0.6 mg/dL (ref 0.3–1.2)
Total Protein: 7.4 g/dL (ref 6.5–8.1)

## 2023-03-29 LAB — URINALYSIS, ROUTINE W REFLEX MICROSCOPIC
Bacteria, UA: NONE SEEN
Bilirubin Urine: NEGATIVE
Glucose, UA: NEGATIVE mg/dL
Hgb urine dipstick: NEGATIVE
Ketones, ur: NEGATIVE mg/dL
Leukocytes,Ua: NEGATIVE
Nitrite: NEGATIVE
Protein, ur: 30 mg/dL — AB
Specific Gravity, Urine: 1.012 (ref 1.005–1.030)
pH: 5 (ref 5.0–8.0)

## 2023-03-29 LAB — CBC
HCT: 26.9 % — ABNORMAL LOW (ref 39.0–52.0)
Hemoglobin: 8.7 g/dL — ABNORMAL LOW (ref 13.0–17.0)
MCH: 28.2 pg (ref 26.0–34.0)
MCHC: 32.3 g/dL (ref 30.0–36.0)
MCV: 87.1 fL (ref 80.0–100.0)
Platelets: 123 10*3/uL — ABNORMAL LOW (ref 150–400)
RBC: 3.09 MIL/uL — ABNORMAL LOW (ref 4.22–5.81)
RDW: 16.5 % — ABNORMAL HIGH (ref 11.5–15.5)
WBC: 9.9 10*3/uL (ref 4.0–10.5)
nRBC: 0 % (ref 0.0–0.2)

## 2023-03-29 LAB — TROPONIN I (HIGH SENSITIVITY)
Troponin I (High Sensitivity): 11 ng/L (ref <18)
Troponin I (High Sensitivity): 11 ng/L (ref <18)

## 2023-03-29 LAB — LIPASE, BLOOD: Lipase: 57 U/L — ABNORMAL HIGH (ref 11–51)

## 2023-03-29 MED ORDER — IOHEXOL 350 MG/ML SOLN
100.0000 mL | Freq: Once | INTRAVENOUS | Status: AC | PRN
  Administered 2023-03-29: 100 mL via INTRAVENOUS

## 2023-03-29 MED ORDER — POTASSIUM CHLORIDE CRYS ER 20 MEQ PO TBCR
40.0000 meq | EXTENDED_RELEASE_TABLET | Freq: Once | ORAL | Status: AC
  Administered 2023-03-29: 40 meq via ORAL
  Filled 2023-03-29: qty 2

## 2023-03-29 MED ORDER — PIPERACILLIN-TAZOBACTAM 3.375 G IVPB 30 MIN
3.3750 g | Freq: Once | INTRAVENOUS | Status: AC
  Administered 2023-03-30: 3.375 g via INTRAVENOUS
  Filled 2023-03-29: qty 50

## 2023-03-29 MED ORDER — HYDROMORPHONE HCL 1 MG/ML IJ SOLN
1.0000 mg | Freq: Once | INTRAMUSCULAR | Status: AC
  Administered 2023-03-29: 1 mg via INTRAVENOUS
  Filled 2023-03-29: qty 1

## 2023-03-29 MED ORDER — FLUCONAZOLE 100 MG PO TABS: 100.0000 mg | ORAL_TABLET | Freq: Once | ORAL | Status: DC

## 2023-03-29 MED ORDER — FAMOTIDINE IN NACL 20-0.9 MG/50ML-% IV SOLN
20.0000 mg | Freq: Once | INTRAVENOUS | Status: AC
  Administered 2023-03-30: 20 mg via INTRAVENOUS
  Filled 2023-03-29: qty 50

## 2023-03-29 MED ORDER — SODIUM CHLORIDE 0.9 % IV BOLUS
500.0000 mL | Freq: Once | INTRAVENOUS | Status: AC
  Administered 2023-03-30: 500 mL via INTRAVENOUS

## 2023-03-29 MED ORDER — ONDANSETRON 4 MG PO TBDP
4.0000 mg | ORAL_TABLET | Freq: Once | ORAL | Status: AC | PRN
  Administered 2023-03-29: 4 mg via ORAL
  Filled 2023-03-29: qty 1

## 2023-03-29 MED ORDER — KETOROLAC TROMETHAMINE 60 MG/2ML IM SOLN
30.0000 mg | Freq: Once | INTRAMUSCULAR | Status: AC
  Administered 2023-03-29: 30 mg via INTRAMUSCULAR
  Filled 2023-03-29: qty 2

## 2023-03-29 NOTE — ED Triage Notes (Signed)
Patient arrives for two hours of abdominal and flank pain and "lung pain." States multiple kidney stones and this feels much different. Emesis x 1 and appears very uncomfortable in triage. Endorses sore throat and nasal congestion as well.

## 2023-03-29 NOTE — ED Notes (Signed)
Patient transported to CT by primary RN. RN and provider present throughout scan.

## 2023-03-29 NOTE — ED Provider Notes (Incomplete)
[place] EMERGENCY DEPARTMENT AT [facility] Provider Note   CSN: [id number] Arrival date & time: 03/29/23  1520     History {Add pertinent medical, surgical, social history, OB history to HPI:1} Chief Complaint  Patient presents with  . Abdominal Pain  . Flank Pain    MASE CASSANI is a 62 y.o. male   Abdominal Pain Flank Pain Associated symptoms include abdominal pain.    Past Medical History:  Diagnosis Date  . Hypertension    Past Surgical History:  Procedure Laterality Date  . BIOPSY  02/12/2022   Procedure: BIOPSY;  Surgeon: Kerin Salen, MD;  Location: [facility] ENDOSCOPY;  Service: Gastroenterology;;  . ESOPHAGOGASTRODUODENOSCOPY (EGD) WITH PROPOFOL N/A 02/12/2022   Procedure: ESOPHAGOGASTRODUODENOSCOPY (EGD) WITH PROPOFOL;  Surgeon: Kerin Salen, MD;  Location: [facility] ENDOSCOPY;  Service: Gastroenterology;  Laterality: N/A;     Home Medications Prior to Admission medications   Medication Sig Start Date End Date Taking? Authorizing Provider  benzonatate (TESSALON) 100 MG capsule Take 1 capsule (100 mg total) by mouth 3 (three) times daily as needed for cough. 07/10/22   Mesner, Barbara Cower, MD  colchicine 0.6 MG tablet Take 1 tablet (0.6 mg total) by mouth daily. 02/15/22   Elgergawy, Leana Roe, MD  folic acid (FOLVITE) 1 MG tablet Take 1 tablet (1 mg total) by mouth daily. 02/16/22   Elgergawy, Leana Roe, MD  HYDROcodone-acetaminophen (NORCO/VICODIN) 5-325 MG tablet Take 1-2 tablets by mouth every 6 (six) hours as needed for severe pain. 10/07/22   Clark, Meghan R, PA-C  lisinopril (ZESTRIL) 40 MG tablet Take 40 mg by mouth daily.    [provider]  Multiple Vitamin (MULTIVITAMIN WITH MINERALS) TABS tablet Take 1 tablet by mouth daily. 02/16/22   Elgergawy, Leana Roe, MD  ondansetron (ZOFRAN-ODT) 4 MG disintegrating tablet 4mg  ODT q4 hours prn nausea/vomit 07/10/22   Mesner, Barbara Cower, MD  pantoprazole (PROTONIX) 40 MG tablet Take 1 tablet (40 mg total) by mouth 2 (two)  times daily. 02/15/22   Elgergawy, Leana Roe, MD  predniSONE (DELTASONE) 20 MG tablet 3 tabs po day one, then 2 tabs daily x 4 days 07/10/22   Mesner, Barbara Cower, MD  thiamine (VITAMIN B1) 100 MG tablet Take 1 tablet (100 mg total) by mouth daily. 02/16/22   Elgergawy, Leana Roe, MD  varenicline (CHANTIX) 0.5 MG tablet Take 1 tablet (0.5 mg total) by mouth 2 (two) times daily. 07/10/22   Mesner, Barbara Cower, MD      Allergies    Bee venom and Codeine    Review of Systems   Review of Systems  Gastrointestinal:  Positive for abdominal pain.  Genitourinary:  Positive for flank pain.    Physical Exam Updated Vital Signs BP 134/80   Pulse 95   Temp 98.7 F (37.1 C) (Oral)   Resp 11   SpO2 95%  Physical Exam Vitals and nursing note reviewed.  Constitutional:      Appearance: Normal appearance.  HENT:     Head: Normocephalic and atraumatic.     Mouth/Throat:     Mouth: Mucous membranes are moist.  Eyes:     General: No scleral icterus. Cardiovascular:     Rate and Rhythm: Normal rate and regular rhythm.     Pulses: Normal pulses.     Heart sounds: Normal heart sounds.  Pulmonary:     Effort: Pulmonary effort is normal.     Breath sounds: Normal breath sounds.  Abdominal:     General: Abdomen is flat.  Palpations: Abdomen is soft.     Tenderness: There is generalized abdominal tenderness.  Musculoskeletal:        General: No deformity.  Skin:    General: Skin is warm.     Findings: No rash.  Neurological:     General: No focal deficit present.     Mental Status: He is alert.  Psychiatric:        Mood and Affect: Mood normal.     ED Results / Procedures / Treatments   Labs (all labs ordered are listed, but only abnormal results are displayed) Labs Reviewed  LIPASE, BLOOD - Abnormal; Notable for the following components:      Result Value   Lipase 57 (*)    All other components within normal limits  COMPREHENSIVE METABOLIC PANEL - Abnormal; Notable for the following  components:   Sodium 134 (*)    Potassium 2.9 (*)    Chloride 96 (*)    AST 45 (*)    Alkaline Phosphatase 32 (*)    All other components within normal limits  CBC - Abnormal; Notable for the following components:   RBC 3.09 (*)    Hemoglobin 8.7 (*)    HCT 26.9 (*)    RDW 16.5 (*)    Platelets 123 (*)    All other components within normal limits  URINALYSIS, ROUTINE W REFLEX MICROSCOPIC - Abnormal; Notable for the following components:   Protein, ur 30 (*)    All other components within normal limits  TROPONIN I (HIGH SENSITIVITY)  TROPONIN I (HIGH SENSITIVITY)    EKG None  Radiology CT Angio Chest/Abd/Pel for Dissection W and/or Wo Contrast  Result Date: 03/29/2023 CLINICAL DATA:  Emesis flank pain EXAM: CT ANGIOGRAPHY CHEST, ABDOMEN AND PELVIS TECHNIQUE: CT 02/11/2022 Multidetector CT imaging through the chest, abdomen and pelvis was performed using the standard protocol during bolus administration of intravenous contrast. Multiplanar reconstructed images and MIPs were obtained and reviewed to evaluate the vascular anatomy. RADIATION DOSE REDUCTION: This exam was performed according to the departmental dose-optimization program which includes automated exposure control, adjustment of the mA and/or kV according to patient size and/or use of iterative reconstruction technique. CONTRAST:  OMNIPAQUE IOHEXOL 350 MG/ML SOLN COMPARISON:  CT 02/11/2022 FINDINGS: CTA CHEST FINDINGS Cardiovascular: Non contrasted images of the chest demonstrate no acute intramural hematoma. Moderate aortic atherosclerosis. No aneurysm. No dissection. Coronary vascular calcification. Normal cardiac size. No pericardial effusion. Mediastinum/Nodes: Midline trachea. No thyroid mass. No suspicious lymph nodes. Subcentimeter AP window lymph nodes. Circumferential mid to distal esophageal thickening. Lungs/Pleura: No consolidation or effusion. Minimal emphysema. Minimal ground-glass density within the right  greater than left lung bases. Musculoskeletal: No acute osseous abnormality. Probable old sternal fracture Review of the MIP images confirms the above findings. CTA ABDOMEN AND PELVIS FINDINGS VASCULAR Aorta: Normal caliber aorta without aneurysm, dissection, vasculitis or significant stenosis. Moderate aortic atherosclerosis. Celiac: Patent without evidence of aneurysm, dissection, vasculitis or significant stenosis. Origin calcification. SMA: Patent without evidence of aneurysm, dissection, vasculitis or significant stenosis. Origin calcification. Renals: Single right and single left renal arteries with origin calcification but no stenosis or dissection. 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 IMA: Diminutive but patent. Inflow: Moderate severe atherosclerosis. Mild narrowing of the right internal iliac artery origin. External iliac arteries are patent. Veins: No obvious venous abnormality within the limitations of this arterial phase study. Review of the MIP images confirms the above findings. NON-VASCULAR Hepatobiliary: No  calcified gallstone. No biliary dilatation. Possible surface nodularity of the liver. Pancreas: Unremarkable. No pancreatic ductal dilatation or surrounding inflammatory changes. Spleen: Normal in size without focal abnormality. Adrenals/Urinary Tract: Adrenal glands are normal. No hydronephrosis. 7 mm stone lower pole right kidney. Bladder is unremarkable Stomach/Bowel: The stomach is nonenlarged. Wall thickening and mucosal enhancement of the pylorus. Irregular wall thickening of the duodenal bulb with surrounding inflammatory changes, series 5, image 179. Wall thickening second portion of duodenum with surrounding fluid and stranding. Focal gas and fluid collection measuring 1.7 x 1.3 cm on series 5, image 179 posterior to the duodenal bulb which may reflect an ulcer crater or contained perforation. Remainder of the small bowel is nondistended.  Lymphatic: No suspicious lymph nodes Reproductive: Negative prostate Other: Negative for pelvic effusion.  No free gas is visualized. Musculoskeletal: No acute or suspicious osseous abnormality Review of the MIP images confirms the above findings. IMPRESSION: 1. Negative for acute aortic dissection or aneurysm. 2. Wall thickening and mucosal enhancement of the pylorus, first and second portion of duodenum with surrounding inflammatory changes, findings are consistent with gastritis/duodenitis/peptic ulcer disease. Focal gas and fluid collection measuring 1.7 cm posterior to the duodenal bulb which may reflect an ulcer crater or contained perforation. No free gas visualized on this exam. 3. Circumferential mid to distal esophageal thickening, question esophagitis or reflux. Correlate with endoscopy if not already performed 4. Possible surface nodularity of the liver, correlate for cirrhosis. 5. Nonobstructing right kidney stone. Aortic Atherosclerosis (ICD10-I70.0) and Emphysema (ICD10-J43.9). Electronically Signed   By: Jasmine Pang M.D.   On: 03/29/2023 21:18    Procedures Procedures  {Document cardiac monitor, telemetry assessment procedure when appropriate:1}  Medications Ordered in ED Medications  ondansetron (ZOFRAN-ODT) disintegrating tablet 4 mg (4 mg Oral Given 03/29/23 1614)  HYDROmorphone (DILAUDID) injection 1 mg (1 mg Intravenous Given 03/29/23 2006)  ketorolac (TORADOL) injection 30 mg (30 mg Intramuscular Given 03/29/23 2009)  iohexol (OMNIPAQUE) 350 MG/ML injection 100 mL (100 mLs Intravenous Contrast Given 03/29/23 2019)  potassium chloride SA (KLOR-CON M) CR tablet 40 mEq (40 mEq Oral Given 03/29/23 2157)    ED Course/ Medical Decision Making/ A&P   {   Click here for ABCD2, HEART and other calculatorsREFRESH Note before signing :1}                              Medical Decision Making Amount and/or Complexity of Data Reviewed Labs: ordered. Radiology:  ordered.  Risk Prescription drug management.   This patient presents to the ED for abdominal pain, this involves an extensive number of treatment options, and is a complaint that carries with a high risk of complications and morbidity.  The differential diagnosis includes diverticulitis, hernia, diverticulitis, UTI, constipation, male- testicular torsion, orchitis epididymitis, Fournier's.  This is not an exhaustive list.  Lab tests: I ordered and personally interpreted labs.  The pertinent results include: WBC unremarkable. Hbg 8.7. Platelets unremarkable. Electrolytes unremarkable. BUN, creatinine unremarkable.  Lipase is 57.  That the troponin is negative.  Imaging studies: I ordered imaging studies, personally reviewed, interpreted imaging and agree with the radiologist's interpretations. The results include: ***   Problem list/ ED course/ Critical interventions/ Medical management: HPI: See above Vital signs ***within normal range and stable throughout visit. Laboratory/imaging studies significant for: See above. On physical examination, patient is afebrile and appears in no acute distress. *** I have reviewed the patient home medicines and have made adjustments  as needed.  Cardiac monitoring/EKG: The patient was maintained on a cardiac monitor.  I personally reviewed and interpreted the cardiac monitor which showed an underlying rhythm of: sinus rhythm.  Additional history obtained: External records from outside source obtained and reviewed including: Chart review including previous notes, labs, imaging.  Consultations obtained: I spoke to Dr. Hillery Hunter general surgery.  He recommended IV antibiotics and admission to medicine. I spoke to Dr. Emmie Niemann hospitalist.  He agrees admit the patient.  Disposition Admit.  This chart was dictated using voice recognition software.  Despite best efforts to proofread,  errors can occur which can change the documentation meaning.     {Document critical care time when appropriate:1} {Document review of labs and clinical decision tools ie heart score, Chads2Vasc2 etc:1}  {Document your independent review of radiology images, and any outside records:1} {Document your discussion with family members, caretakers, and with consultants:1} {Document social determinants of health affecting pt's care:1} {Document your decision making why or why not admission, treatments were needed:1} Final Clinical Impression(s) / ED Diagnoses Final diagnoses:  None    Rx / DC Orders ED Discharge Orders     None

## 2023-03-29 NOTE — ED Provider Notes (Signed)
[place] [facility]ERGENCY DEPARTMENT AT [facility] Provider Note   CSN: [id number] Arrival date & time: 03/29/23  1520     History  Chief Complaint  Patient presents with   Abdominal Pain   Flank Pain    Christopher Logan is a 62 y.o. male history of hypertension present today for evaluation of abdominal pain.  Patient reports that he has had abdominal pain since 3 PM today.  Pain is all over his abdomen and radiates to his back.  He reports 4 episodes of vomiting today.  Denies any abdominal surgery in the past.  Denies any fever, cold/chills, bowel changes, urinary symptoms, blood in his stool or urine.  Patient reports that he smoked about half a pack of cigarettes a day for years.   Abdominal Pain Flank Pain Associated symptoms include abdominal pain.    Past Medical History:  Diagnosis Date   Hypertension    Past Surgical History:  Procedure Laterality Date   BIOPSY  02/12/2022   Procedure: BIOPSY;  Surgeon: Kerin Salen, MD;  Location: [facility] ENDOSCOPY;  Service: Gastroenterology;;   ESOPHAGOGASTRODUODENOSCOPY (EGD) WITH PROPOFOL N/A 02/12/2022   Procedure: ESOPHAGOGASTRODUODENOSCOPY (EGD) WITH PROPOFOL;  Surgeon: Kerin Salen, MD;  Location: [facility] ENDOSCOPY;  Service: Gastroenterology;  Laterality: N/A;     Home Medications Prior to Admission medications   Medication Sig Start Date End Date Taking? Authorizing Provider  benzonatate (TESSALON) 100 MG capsule Take 1 capsule (100 mg total) by mouth 3 (three) times daily as needed for cough. 07/10/22   Mesner, Barbara Cower, MD  colchicine 0.6 MG tablet Take 1 tablet (0.6 mg total) by mouth daily. 02/15/22   Elgergawy, Leana Roe, MD  folic acid (FOLVITE) 1 MG tablet Take 1 tablet (1 mg total) by mouth daily. 02/16/22   Elgergawy, Leana Roe, MD  HYDROcodone-acetaminophen (NORCO/VICODIN) 5-325 MG tablet Take 1-2 tablets by mouth every 6 (six) hours as needed for severe pain. 10/07/22   Clark, Meghan R, PA-C  lisinopril (ZESTRIL) 40 MG tablet  Take 40 mg by mouth daily.    [provider]  Multiple Vitamin (MULTIVITAMIN WITH MINERALS) TABS tablet Take 1 tablet by mouth daily. 02/16/22   Elgergawy, Leana Roe, MD  ondansetron (ZOFRAN-ODT) 4 MG disintegrating tablet 4mg  ODT q4 hours prn nausea/vomit 07/10/22   Mesner, Barbara Cower, MD  pantoprazole (PROTONIX) 40 MG tablet Take 1 tablet (40 mg total) by mouth 2 (two) times daily. 02/15/22   Elgergawy, Leana Roe, MD  predniSONE (DELTASONE) 20 MG tablet 3 tabs po day one, then 2 tabs daily x 4 days 07/10/22   Mesner, Barbara Cower, MD  thiamine (VITAMIN B1) 100 MG tablet Take 1 tablet (100 mg total) by mouth daily. 02/16/22   Elgergawy, Leana Roe, MD  varenicline (CHANTIX) 0.5 MG tablet Take 1 tablet (0.5 mg total) by mouth 2 (two) times daily. 07/10/22   Mesner, Barbara Cower, MD      Allergies    Bee venom and Codeine    Review of Systems   Review of Systems  Gastrointestinal:  Positive for abdominal pain.  Genitourinary:  Positive for flank pain.    Physical Exam Updated Vital Signs BP 134/80   Pulse 95   Temp 98.7 F (37.1 C) (Oral)   Resp 11   SpO2 95%  Physical Exam Vitals and nursing note reviewed.  Constitutional:      Appearance: Normal appearance.  HENT:     Head: Normocephalic and atraumatic.     Mouth/Throat:     Mouth: Mucous  membranes are moist.  Eyes:     General: No scleral icterus. Cardiovascular:     Rate and Rhythm: Normal rate and regular rhythm.     Pulses: Normal pulses.     Heart sounds: Normal heart sounds.  Pulmonary:     Effort: Pulmonary effort is normal.     Breath sounds: Normal breath sounds.  Abdominal:     General: Abdomen is flat.     Palpations: Abdomen is soft.     Tenderness: There is generalized abdominal tenderness.  Musculoskeletal:        General: No deformity.  Skin:    General: Skin is warm.     Findings: No rash.  Neurological:     General: No focal deficit present.     Mental Status: He is alert.  Psychiatric:        Mood and Affect:  Mood normal.    ED Results / Procedures / Treatments   Labs (all labs ordered are listed, but only abnormal results are displayed) Labs Reviewed  LIPASE, BLOOD - Abnormal; Notable for the following components:      Result Value   Lipase 57 (*)    All other components within normal limits  COMPREHENSIVE METABOLIC PANEL - Abnormal; Notable for the following components:   Sodium 134 (*)    Potassium 2.9 (*)    Chloride 96 (*)    AST 45 (*)    Alkaline Phosphatase 32 (*)    All other components within normal limits  CBC - Abnormal; Notable for the following components:   RBC 3.09 (*)    Hemoglobin 8.7 (*)    HCT 26.9 (*)    RDW 16.5 (*)    Platelets 123 (*)    All other components within normal limits  URINALYSIS, ROUTINE W REFLEX MICROSCOPIC - Abnormal; Notable for the following components:   Protein, ur 30 (*)    All other components within normal limits  TROPONIN I (HIGH SENSITIVITY)  TROPONIN I (HIGH SENSITIVITY)    EKG None  Radiology CT Angio Chest/Abd/Pel for Dissection W and/or Wo Contrast  Result Date: 03/29/2023 CLINICAL DATA:  Emesis flank pain EXAM: CT ANGIOGRAPHY CHEST, ABDOMEN AND PELVIS TECHNIQUE: CT 02/11/2022 Multidetector CT imaging through the chest, abdomen and pelvis was performed using the standard protocol during bolus administration of intravenous contrast. Multiplanar reconstructed images and MIPs were obtained and reviewed to evaluate the vascular anatomy. RADIATION DOSE REDUCTION: This exam was performed according to the departmental dose-optimization program which includes automated exposure control, adjustment of the mA and/or kV according to patient size and/or use of iterative reconstruction technique. CONTRAST:  OMNIPAQUE IOHEXOL 350 MG/ML SOLN COMPARISON:  CT 02/11/2022 FINDINGS: CTA CHEST FINDINGS Cardiovascular: Non contrasted images of the chest demonstrate no acute intramural hematoma. Moderate aortic atherosclerosis. No aneurysm. No  dissection. Coronary vascular calcification. Normal cardiac size. No pericardial effusion. Mediastinum/Nodes: Midline trachea. No thyroid mass. No suspicious lymph nodes. Subcentimeter AP window lymph nodes. Circumferential mid to distal esophageal thickening. Lungs/Pleura: No consolidation or effusion. Minimal emphysema. Minimal ground-glass density within the right greater than left lung bases. Musculoskeletal: No acute osseous abnormality. Probable old sternal fracture Review of the MIP images confirms the above findings. CTA ABDOMEN AND PELVIS FINDINGS VASCULAR Aorta: Normal caliber aorta without aneurysm, dissection, vasculitis or significant stenosis. Moderate aortic atherosclerosis. Celiac: Patent without evidence of aneurysm, dissection, vasculitis or significant stenosis. Origin calcification. SMA: Patent without evidence of aneurysm, dissection, vasculitis or significant stenosis. Origin calcification. Renals: Single right  and single left renal arteries with origin calcification but no stenosis or dissection. 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 IMA: Diminutive but patent. Inflow: Moderate severe atherosclerosis. Mild narrowing of the right internal iliac artery origin. External iliac arteries are patent. Veins: No obvious venous abnormality within the limitations of this arterial phase study. Review of the MIP images confirms the above findings. NON-VASCULAR Hepatobiliary: No calcified gallstone. No biliary dilatation. Possible surface nodularity of the liver. Pancreas: Unremarkable. No pancreatic ductal dilatation or surrounding inflammatory changes. Spleen: Normal in size without focal abnormality. Adrenals/Urinary Tract: Adrenal glands are normal. No hydronephrosis. 7 mm stone lower pole right kidney. Bladder is unremarkable Stomach/Bowel: The stomach is nonenlarged. Wall thickening and mucosal enhancement of the pylorus. Irregular wall thickening of the  duodenal bulb with surrounding inflammatory changes, series 5, image 179. Wall thickening second portion of duodenum with surrounding fluid and stranding. Focal gas and fluid collection measuring 1.7 x 1.3 cm on series 5, image 179 posterior to the duodenal bulb which may reflect an ulcer crater or contained perforation. Remainder of the small bowel is nondistended. Lymphatic: No suspicious lymph nodes Reproductive: Negative prostate Other: Negative for pelvic effusion.  No free gas is visualized. Musculoskeletal: No acute or suspicious osseous abnormality Review of the MIP images confirms the above findings. IMPRESSION: 1. Negative for acute aortic dissection or aneurysm. 2. Wall thickening and mucosal enhancement of the pylorus, first and second portion of duodenum with surrounding inflammatory changes, findings are consistent with gastritis/duodenitis/peptic ulcer disease. Focal gas and fluid collection measuring 1.7 cm posterior to the duodenal bulb which may reflect an ulcer crater or contained perforation. No free gas visualized on this exam. 3. Circumferential mid to distal esophageal thickening, question esophagitis or reflux. Correlate with endoscopy if not already performed 4. Possible surface nodularity of the liver, correlate for cirrhosis. 5. Nonobstructing right kidney stone. Aortic Atherosclerosis (ICD10-I70.0) and Emphysema (ICD10-J43.9). Electronically Signed   By: Jasmine Pang M.D.   On: 03/29/2023 21:18    Procedures Procedures    Medications Ordered in ED Medications  ondansetron (ZOFRAN-ODT) disintegrating tablet 4 mg (4 mg Oral Given 03/29/23 1614)  HYDROmorphone (DILAUDID) injection 1 mg (1 mg Intravenous Given 03/29/23 2006)  ketorolac (TORADOL) injection 30 mg (30 mg Intramuscular Given 03/29/23 2009)  iohexol (OMNIPAQUE) 350 MG/ML injection 100 mL (100 mLs Intravenous Contrast Given 03/29/23 2019)  potassium chloride SA (KLOR-CON M) CR tablet 40 mEq (40 mEq Oral Given 03/29/23 2157)     ED Course/ Medical Decision Making/ A&P                                 Medical Decision Making Amount and/or Complexity of Data Reviewed Labs: ordered. Radiology: ordered.  Risk Prescription drug management.   This patient presents to the ED for abdominal pain, this involves an extensive number of treatment options, and is a complaint that carries with a high risk of complications and morbidity.  The differential diagnosis includes diverticulitis, hernia, diverticulitis, UTI, constipation, testicular torsion, orchitis epididymitis, Fournier's.  This is not an exhaustive list.  Lab tests: I ordered and personally interpreted labs.  The pertinent results include: WBC unremarkable. Hbg 8.7. Platelets unremarkable. Electrolytes unremarkable. BUN, creatinine unremarkable.  Lipase is 57.  That the troponin is negative.  Imaging studies: I ordered imaging studies, personally reviewed, interpreted imaging and agree with the radiologist's interpretations. The results include:  CT angio chest abdomen pelvis significant for PUD/gastritis and focal gas and fluid collection behind the duodenum which could be an ulcer crater or contained perforation.  No free gas noted.  Problem list/ ED course/ Critical interventions/ Medical management: HPI: See above Vital signs within normal range and stable throughout visit. Laboratory/imaging studies significant for: See above. On physical examination, patient is afebrile and appears in no acute distress.  Patient has generalized abdominal pain that radiates to the back.  When I was in the room patient was in excruciating pain so code medical activated.  CT angio chest abdomen pelvis showed no evidence of dissection or AAA.  It did show PUD/gastritis and a contained perforation.  Will consult general surgery.  Given IV Zosyn, Toradol and Dilaudid for pain.  Reevaluation of patient after this medication showed the patient improved.  Patient will require  admission for IV antibiotics. I have reviewed the patient home medicines and have made adjustments as needed.  Cardiac monitoring/EKG: The patient was maintained on a cardiac monitor.  I personally reviewed and interpreted the cardiac monitor which showed an underlying rhythm of: sinus rhythm.  Additional history obtained: External records from outside source obtained and reviewed including: Chart review including previous notes, labs, imaging.  Consultations obtained: I spoke to Dr. Hillery Hunter general surgery.  He recommended IV antibiotics, NPO at midnight and admission to medicine.  Likely no surgery needed. I spoke to Dr. Lazarus Salines [facility] hospitalist.  He will consult general surgery and decide.  Disposition Care signed out to Barrie Dunker with pending Medicine consult.  This chart was dictated using voice recognition software.  Despite best efforts to proofread,  errors can occur which can change the documentation meaning.          Final Clinical Impression(s) / ED Diagnoses Final diagnoses:  Duodenal bulb ulcer perforation (HCC)  Generalized abdominal pain    Rx / DC Orders ED Discharge Orders     None         Jeanelle Malling, [place] 03/30/23 0024    Gerhard Munch, MD 04/06/23 1047

## 2023-03-29 NOTE — Consult Note (Signed)
Christopher Logan 1961-04-03  [id number].    Requesting MD: Conley Rolls Chief Complaint/Reason for Consult: Duodenal Perforation   HPI:  62 y/o M w a hx of Hep C, Cirrhosis, and active alcohol abuse who presented to the ED with abdominal pain and has a CT c/f contained duodenal perforation.  Approximately 1 year ago he presented with abdominal pain and melena and ultimately underwent an EGD that demonstrated gastric and duodenal ulcers.  The gastric ulcer was biopsied and was negative for H.Pylori.  The duodenal ulcer was not biopsied.  He was prescribed a PPI and reports that he has been unable to fill the prescription for at least 2 weeks because of issues with the pharmacy.  Earlier today he developed sudden onset abdominal pain that prompted him to present to the ED.  He denies fevers.  He had a BM within the past 24 hrs and denies blood.  WBC 9.  He is AF and HDS.  At the time of my exam he reports that his pain has resolved and denies current complaints.   Of note, he drinks a bottle of liquor daily.  His last drink was 9/11.    ROS: Review of Systems  Constitutional: Negative.   HENT: Negative.    Eyes: Negative.   Respiratory: Negative.    Cardiovascular: Negative.   Gastrointestinal:  Positive for abdominal pain.  Genitourinary: Negative.   Musculoskeletal: Negative.   Skin: Negative.   Neurological: Negative.   Endo/Heme/Allergies: Negative.   Psychiatric/Behavioral: Negative.      Family History  Problem Relation Age of Onset   Cancer Mother     Past Medical History:  Diagnosis Date   Hypertension     Past Surgical History:  Procedure Laterality Date   BIOPSY  02/12/2022   Procedure: BIOPSY;  Surgeon: Kerin Salen, MD;  Location: [facility] ENDOSCOPY;  Service: Gastroenterology;;   ESOPHAGOGASTRODUODENOSCOPY (EGD) WITH PROPOFOL N/A 02/12/2022   Procedure: ESOPHAGOGASTRODUODENOSCOPY (EGD) WITH PROPOFOL;  Surgeon: Kerin Salen, MD;  Location: [facility] ENDOSCOPY;  Service:  Gastroenterology;  Laterality: N/A;    Social History:  reports that he quit smoking about 15 months ago. His smoking use included cigarettes. He started smoking about 46 years ago. He has a 67.9 pack-year smoking history. He has never been exposed to tobacco smoke. He has never used smokeless tobacco. He reports current alcohol use. He reports current drug use. Drug: Marijuana.  Allergies:  Allergies  Allergen Reactions   Bee Venom Anaphylaxis   Codeine Nausea Only    (Not in a hospital admission)   Physical Exam: Blood pressure 134/80, pulse 95, temperature 98.7 F (37.1 C), temperature source Oral, resp. rate 11, SpO2 95%. Gen: elderly male, NAD Resp: no increased WOB CV: HR 90s, RRR Abd: soft, non-distended, mild RUQ pain without rebound/guarding, no peritoneal signs Neuro: moving all extremities  Results for orders placed or performed during the hospital encounter of 03/29/23 (from the past 48 hour(s))  Lipase, blood     Status: Abnormal   Collection Time: 03/29/23  4:37 PM  Result Value Ref Range   Lipase 57 (H) 11 - 51 U/L    Comment: Performed at [facility], [address] [place], [place] [address]  Comprehensive metabolic panel     Status: Abnormal   Collection Time: 03/29/23  4:37 PM  Result Value Ref Range   Sodium 134 (L) 135 - 145 mmol/L   Potassium 2.9 (L) 3.5 - 5.1 mmol/L   Chloride 96 (L)  98 - 111 mmol/L   CO2 24 22 - 32 mmol/L   Glucose, Bld 89 70 - 99 mg/dL    Comment: Glucose reference range applies only to samples taken after fasting for at least 8 hours.   BUN 9 8 - 23 mg/dL   Creatinine, Ser 4.09 0.61 - 1.24 mg/dL   Calcium 9.2 8.9 - 81.1 mg/dL   Total Protein 7.4 6.5 - 8.1 g/dL   Albumin 3.9 3.5 - 5.0 g/dL   AST 45 (H) 15 - 41 U/L   ALT 18 0 - 44 U/L   Alkaline Phosphatase 32 (L) 38 - 126 U/L   Total Bilirubin 0.6 0.3 - 1.2 mg/dL   GFR, Estimated >91 >47 mL/min    Comment: (NOTE) Calculated using the CKD-EPI Creatinine Equation  (2021)    Anion gap 14 5 - 15    Comment: Performed at [facility], [address], [place], [place] [address]  CBC     Status: Abnormal   Collection Time: 03/29/23  4:37 PM  Result Value Ref Range   WBC 9.9 4.0 - 10.5 K/uL   RBC 3.09 (L) 4.22 - 5.81 MIL/uL   Hemoglobin 8.7 (L) 13.0 - 17.0 g/dL   HCT 21.3 (L) 08.6 - 57.8 %   MCV 87.1 80.0 - 100.0 fL   MCH 28.2 26.0 - 34.0 pg   MCHC 32.3 30.0 - 36.0 g/dL   RDW 46.9 (H) 62.9 - 52.8 %   Platelets 123 (L) 150 - 400 K/uL    Comment: SPECIMEN CHECKED FOR CLOTS REPEATED TO VERIFY    nRBC 0.0 0.0 - 0.2 %    Comment: Performed at [facility], [address], [place], [place] [address]  Troponin I (High Sensitivity)     Status: None   Collection Time: 03/29/23  4:37 PM  Result Value Ref Range   Troponin I (High Sensitivity) 11 <18 ng/L    Comment: (NOTE) Elevated high sensitivity troponin I (hsTnI) values and significant  changes across serial measurements may suggest ACS but many other  chronic and acute conditions are known to elevate hsTnI results.  Refer to the "Links" section for chest pain algorithms and additional  guidance. Performed at [facility], [address], [place], [place] [address]   Urinalysis, Routine w reflex microscopic -Urine, Clean Catch     Status: Abnormal   Collection Time: 03/29/23  5:30 PM  Result Value Ref Range   Color, Urine YELLOW YELLOW   APPearance CLEAR CLEAR   Specific Gravity, Urine 1.012 1.005 - 1.030   pH 5.0 5.0 - 8.0   Glucose, UA NEGATIVE NEGATIVE mg/dL   Hgb urine dipstick NEGATIVE NEGATIVE   Bilirubin Urine NEGATIVE NEGATIVE   Ketones, ur NEGATIVE NEGATIVE mg/dL   Protein, ur 30 (A) NEGATIVE mg/dL   Nitrite NEGATIVE NEGATIVE   Leukocytes,Ua NEGATIVE NEGATIVE   RBC / HPF 0-5 0 - 5 RBC/hpf   WBC, UA 0-5 0 - 5 WBC/hpf   Bacteria, UA NONE SEEN NONE SEEN   Squamous Epithelial / HPF 0-5 0 - 5 /HPF   Mucus PRESENT    Hyaline Casts, UA PRESENT     Comment: Performed  at [facility], [address] [place], [place] [address]  Troponin I (High Sensitivity)     Status: None   Collection Time: 03/29/23  8:04 PM  Result Value Ref Range   Troponin I (High Sensitivity) 11 <18 ng/L  Comment: (NOTE) Elevated high sensitivity troponin I (hsTnI) values and significant  changes across serial measurements may suggest ACS but many other  chronic and acute conditions are known to elevate hsTnI results.  Refer to the "Links" section for chest pain algorithms and additional  guidance. Performed at [facility], [address], [place], [place] [address]    CT Angio Chest/Abd/Pel for Dissection W and/or Wo Contrast  Result Date: 03/29/2023 CLINICAL DATA:  Emesis flank pain EXAM: CT ANGIOGRAPHY CHEST, ABDOMEN AND PELVIS TECHNIQUE: CT 02/11/2022 Multidetector CT imaging through the chest, abdomen and pelvis was performed using the standard protocol during bolus administration of intravenous contrast. Multiplanar reconstructed images and MIPs were obtained and reviewed to evaluate the vascular anatomy. RADIATION DOSE REDUCTION: This exam was performed according to the departmental dose-optimization program which includes automated exposure control, adjustment of the mA and/or kV according to patient size and/or use of iterative reconstruction technique. CONTRAST:  OMNIPAQUE IOHEXOL 350 MG/ML SOLN COMPARISON:  CT 02/11/2022 FINDINGS: CTA CHEST FINDINGS Cardiovascular: Non contrasted images of the chest demonstrate no acute intramural hematoma. Moderate aortic atherosclerosis. No aneurysm. No dissection. Coronary vascular calcification. Normal cardiac size. No pericardial effusion. Mediastinum/Nodes: Midline trachea. No thyroid mass. No suspicious lymph nodes. Subcentimeter AP window lymph nodes. Circumferential mid to distal esophageal thickening. Lungs/Pleura: No consolidation or effusion. Minimal emphysema. Minimal ground-glass density within the right greater  than left lung bases. Musculoskeletal: No acute osseous abnormality. Probable old sternal fracture Review of the MIP images confirms the above findings. CTA ABDOMEN AND PELVIS FINDINGS VASCULAR Aorta: Normal caliber aorta without aneurysm, dissection, vasculitis or significant stenosis. Moderate aortic atherosclerosis. Celiac: Patent without evidence of aneurysm, dissection, vasculitis or significant stenosis. Origin calcification. SMA: Patent without evidence of aneurysm, dissection, vasculitis or significant stenosis. Origin calcification. Renals: Single right and single left renal arteries with origin calcification but no stenosis or dissection. 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 IMA: Diminutive but patent. Inflow: Moderate severe atherosclerosis. Mild narrowing of the right internal iliac artery origin. External iliac arteries are patent. Veins: No obvious venous abnormality within the limitations of this arterial phase study. Review of the MIP images confirms the above findings. NON-VASCULAR Hepatobiliary: No calcified gallstone. No biliary dilatation. Possible surface nodularity of the liver. Pancreas: Unremarkable. No pancreatic ductal dilatation or surrounding inflammatory changes. Spleen: Normal in size without focal abnormality. Adrenals/Urinary Tract: Adrenal glands are normal. No hydronephrosis. 7 mm stone lower pole right kidney. Bladder is unremarkable Stomach/Bowel: The stomach is nonenlarged. Wall thickening and mucosal enhancement of the pylorus. Irregular wall thickening of the duodenal bulb with surrounding inflammatory changes, series 5, image 179. Wall thickening second portion of duodenum with surrounding fluid and stranding. Focal gas and fluid collection measuring 1.7 x 1.3 cm on series 5, image 179 posterior to the duodenal bulb which may reflect an ulcer crater or contained perforation. Remainder of the small bowel is nondistended.  Lymphatic: No suspicious lymph nodes Reproductive: Negative prostate Other: Negative for pelvic effusion.  No free gas is visualized. Musculoskeletal: No acute or suspicious osseous abnormality Review of the MIP images confirms the above findings. IMPRESSION: 1. Negative for acute aortic dissection or aneurysm. 2. Wall thickening and mucosal enhancement of the pylorus, first and second portion of duodenum with surrounding inflammatory changes, findings are consistent with gastritis/duodenitis/peptic ulcer disease. Focal gas and fluid collection measuring 1.7 cm posterior to the duodenal bulb which may reflect an ulcer crater or contained  perforation. No free gas visualized on this exam. 3. Circumferential mid to distal esophageal thickening, question esophagitis or reflux. Correlate with endoscopy if not already performed 4. Possible surface nodularity of the liver, correlate for cirrhosis. 5. Nonobstructing right kidney stone. Aortic Atherosclerosis (ICD10-I70.0) and Emphysema (ICD10-J43.9). Electronically Signed   By: Jasmine Pang M.D.   On: 03/29/2023 21:18    Assessment/Plan 63 y/o M with a hx of Cirrhosis and alcohol abuse who presents with abdominal pain and has a CT showing c/f a contained duodenal perforation  - No indication for urgent surgical intervention.  He appears to have a contained perforation on imaging and is non-toxic in appearance with a normal WBC - Would recommend admission for management of his multiple comorbidities - Strict NPO - Would treat empirically for H.Pylori  - Will order an UGI to evaluate for extravasation  - Will continue to follow clinically tonight to determine need for surgery  I reviewed last 24 h vitals and pain scores, last 24 h labs and trends, and last 24 h imaging results.  Tacy [facility] 03/29/2023, 11:20 PM Please see Amion for pager number during day hours 7:00am-4:30pm or 7:00am -11:30am on weekends

## 2023-03-29 NOTE — Progress Notes (Signed)
ED Pharmacy Antibiotic Sign Off An antibiotic consult was received from an ED provider for zosyn per pharmacy dosing for contained duodenal perforation. A chart review was completed to assess appropriateness.   The following one time order(s) were placed:  Zosyn 3.375g over 30 minutes  Further antibiotic and/or antibiotic pharmacy consults should be ordered by the admitting provider if indicated.   Thank you for allowing pharmacy to be a part of this patient's care.   Marja Kays, [facility]  Clinical Pharmacist 03/29/23 11:55 PM

## 2023-03-30 ENCOUNTER — Encounter (HOSPITAL_COMMUNITY): Payer: Self-pay | Admitting: Internal Medicine

## 2023-03-30 ENCOUNTER — Inpatient Hospital Stay (HOSPITAL_COMMUNITY): Payer: Self-pay

## 2023-03-30 DIAGNOSIS — E876 Hypokalemia: Secondary | ICD-10-CM | POA: Insufficient documentation

## 2023-03-30 DIAGNOSIS — D649 Anemia, unspecified: Secondary | ICD-10-CM | POA: Insufficient documentation

## 2023-03-30 DIAGNOSIS — K631 Perforation of intestine (nontraumatic): Secondary | ICD-10-CM

## 2023-03-30 LAB — COMPREHENSIVE METABOLIC PANEL
ALT: 15 U/L (ref 0–44)
AST: 34 U/L (ref 15–41)
Albumin: 3.2 g/dL — ABNORMAL LOW (ref 3.5–5.0)
Alkaline Phosphatase: 26 U/L — ABNORMAL LOW (ref 38–126)
Anion gap: 10 (ref 5–15)
BUN: 6 mg/dL — ABNORMAL LOW (ref 8–23)
CO2: 26 mmol/L (ref 22–32)
Calcium: 8.1 mg/dL — ABNORMAL LOW (ref 8.9–10.3)
Chloride: 98 mmol/L (ref 98–111)
Creatinine, Ser: 0.62 mg/dL (ref 0.61–1.24)
GFR, Estimated: >60 mL/min (ref >60)
Glucose, Bld: 95 mg/dL (ref 70–99)
Potassium: 3.3 mmol/L — ABNORMAL LOW (ref 3.5–5.1)
Sodium: 134 mmol/L — ABNORMAL LOW (ref 135–145)
Total Bilirubin: 0.7 mg/dL (ref 0.3–1.2)
Total Protein: 6.3 g/dL — ABNORMAL LOW (ref 6.5–8.1)

## 2023-03-30 LAB — CBC
HCT: 27.1 % — ABNORMAL LOW (ref 39.0–52.0)
Hemoglobin: 8.7 g/dL — ABNORMAL LOW (ref 13.0–17.0)
MCH: 27.7 pg (ref 26.0–34.0)
MCHC: 32.1 g/dL (ref 30.0–36.0)
MCV: 86.3 fL (ref 80.0–100.0)
Platelets: 109 10*3/uL — ABNORMAL LOW (ref 150–400)
RBC: 3.14 MIL/uL — ABNORMAL LOW (ref 4.22–5.81)
RDW: 16.3 % — ABNORMAL HIGH (ref 11.5–15.5)
WBC: 7.3 10*3/uL (ref 4.0–10.5)
nRBC: 0 % (ref 0.0–0.2)

## 2023-03-30 LAB — TRANSFERRIN: Transferrin: 299 mg/dL (ref 180–329)

## 2023-03-30 LAB — TYPE AND SCREEN
ABO/RH(D): A POS
Antibody Screen: NEGATIVE

## 2023-03-30 LAB — LACTIC ACID, PLASMA
Lactic Acid, Venous: 1 mmol/L (ref 0.5–1.9)
Lactic Acid, Venous: 0.9 mmol/L (ref 0.5–1.9)

## 2023-03-30 LAB — IRON AND TIBC
Iron: 34 ug/dL — ABNORMAL LOW (ref 45–182)
Saturation Ratios: 8 % — ABNORMAL LOW (ref 17.9–39.5)
TIBC: 421 ug/dL (ref 250–450)
UIBC: 387 ug/dL

## 2023-03-30 LAB — FERRITIN: Ferritin: 31 ng/mL (ref 24–336)

## 2023-03-30 LAB — VITAMIN B12: Vitamin B-12: 130 pg/mL — ABNORMAL LOW (ref 180–914)

## 2023-03-30 LAB — HIV ANTIBODY (ROUTINE TESTING W REFLEX): HIV Screen 4th Generation wRfx: NONREACTIVE

## 2023-03-30 LAB — PROTIME-INR
INR: 1.1 (ref 0.8–1.2)
Prothrombin Time: 13.9 s (ref 11.4–15.2)

## 2023-03-30 MED ORDER — PIPERACILLIN-TAZOBACTAM 3.375 G IVPB: 3.3750 g | Freq: Three times a day (TID) | INTRAVENOUS | Status: DC

## 2023-03-30 MED ORDER — SODIUM CHLORIDE 0.9% FLUSH
3.0000 mL | Freq: Two times a day (BID) | INTRAVENOUS | Status: DC
  Administered 2023-03-30 – 2023-04-04 (×10): 3 mL via INTRAVENOUS

## 2023-03-30 MED ORDER — PANTOPRAZOLE SODIUM 40 MG IV SOLR
40.0000 mg | Freq: Two times a day (BID) | INTRAVENOUS | Status: DC
  Administered 2023-03-30 – 2023-04-03 (×9): 40 mg via INTRAVENOUS
  Filled 2023-03-30 (×9): qty 10

## 2023-03-30 MED ORDER — LACTATED RINGERS IV SOLN: INTRAVENOUS | Status: DC

## 2023-03-30 MED ORDER — HYDROMORPHONE HCL 1 MG/ML IJ SOLN
1.0000 mg | INTRAMUSCULAR | Status: DC | PRN
  Administered 2023-03-30 – 2023-04-04 (×13): 1 mg via INTRAVENOUS
  Filled 2023-03-30 (×14): qty 1

## 2023-03-30 MED ORDER — POTASSIUM CHLORIDE 10 MEQ/100ML IV SOLN
10.0000 meq | INTRAVENOUS | Status: AC
  Administered 2023-03-30 (×4): 10 meq via INTRAVENOUS
  Filled 2023-03-30 (×4): qty 100

## 2023-03-30 MED ORDER — IOHEXOL 300 MG/ML  SOLN
100.0000 mL | Freq: Once | INTRAMUSCULAR | Status: AC | PRN
  Administered 2023-03-30: 100 mL via ORAL

## 2023-03-30 MED ORDER — PIPERACILLIN-TAZOBACTAM 3.375 G IVPB
3.3750 g | Freq: Three times a day (TID) | INTRAVENOUS | Status: DC
  Administered 2023-03-30 – 2023-04-04 (×14): 3.375 g via INTRAVENOUS
  Filled 2023-03-30 (×14): qty 50

## 2023-03-30 MED ORDER — HYDROMORPHONE HCL 1 MG/ML IJ SOLN
0.5000 mg | INTRAMUSCULAR | Status: DC | PRN
  Administered 2023-03-30 – 2023-04-03 (×6): 0.5 mg via INTRAVENOUS
  Filled 2023-03-30 (×8): qty 0.5

## 2023-03-30 MED ORDER — PIPERACILLIN SOD-TAZOBACTAM SO 2.25 (2-0.25) G IV SOLR
4.5000 g | Freq: Three times a day (TID) | INTRAVENOUS | Status: DC
  Filled 2023-03-30 (×4): qty 20

## 2023-03-30 MED ORDER — LACTATED RINGERS IV BOLUS
1000.0000 mL | Freq: Once | INTRAVENOUS | Status: AC
  Administered 2023-03-30: 1000 mL via INTRAVENOUS

## 2023-03-30 MED ORDER — PIPERACILLIN-TAZOBACTAM 4.5 G IVPB
4.5000 g | Freq: Three times a day (TID) | INTRAVENOUS | Status: DC
  Filled 2023-03-30 (×3): qty 100

## 2023-03-30 MED ORDER — NICOTINE 14 MG/24HR TD PT24
14.0000 mg | MEDICATED_PATCH | Freq: Every day | TRANSDERMAL | Status: DC
  Administered 2023-03-30 – 2023-04-04 (×6): 14 mg via TRANSDERMAL
  Filled 2023-03-30 (×6): qty 1

## 2023-03-30 MED ORDER — HYDROMORPHONE HCL 1 MG/ML IJ SOLN
1.0000 mg | Freq: Once | INTRAMUSCULAR | Status: AC
  Administered 2023-03-30: 1 mg via INTRAVENOUS
  Filled 2023-03-30: qty 1

## 2023-03-30 MED ORDER — PIPERACILLIN SOD-TAZOBACTAM SO 2.25 (2-0.25) G IV SOLR
4.5000 g | Freq: Three times a day (TID) | INTRAVENOUS | Status: AC
  Administered 2023-03-30 (×2): 4.5 g via INTRAVENOUS
  Filled 2023-03-30 (×3): qty 20

## 2023-03-30 MED ORDER — ONDANSETRON HCL 4 MG/2ML IJ SOLN
4.0000 mg | Freq: Four times a day (QID) | INTRAMUSCULAR | Status: DC | PRN
  Administered 2023-03-30 – 2023-04-04 (×9): 4 mg via INTRAVENOUS
  Filled 2023-03-30 (×9): qty 2

## 2023-03-30 NOTE — Progress Notes (Signed)
Subjective: Patient admitted this morning, see detailed H&P by Dr Lazarus Salines 62 y.o. male with medical history significant for PUD, portal HTN gastropathy with last EGD 8/'23, anemia, HTN, compensated cirrhosis, treated Hep C, alcohol abuse, current smoking, who presents with acute onset severe epigastric abd pain x 1 day. Acute and abrupt onset of severe epigastric pain, 10/10, radiating to his back this afternoon.   Found to have contained duodenal perforation Gen surg consulted   Vitals:   03/30/23 0720 03/30/23 0729  BP: (!) 155/78   Pulse: 69   Resp:    Temp:    SpO2:  97%      A/P  Contained duodenal perforation  Acute, severe epigastric abd pain  Seen on CT abdomen/pelvis which showed focal gas and fluid collection measuring 1.7 cm posterior to the duodenal bulb.  General surgery consulted. -Continue IV Zosyn, pantoprazole -Empiric treatment for H. pylori following acute management above -General Surgery to order upper GI series to evaluate for extravasation -Continue n.p.o.   Anemia, acute on chronic  Thrombocytopenia  Baseline Hb variable, last was as high as 12 but previously requiring treatment for anemia associated with PUD. On admission 8.7. Question thrombocytopenia in setting of cirrhosis  - Type and screen  - Trend CBC q 12 hr until stabilized  - Check iron panel, B12    Hypokalemia, asymptomatic, treating  - Repletion, trend BMP    At risk for alcohol withdrawal  Drinks 40 oz malt liquor daily  - CIWA protocol, if elevated add on prn Ativan  - Once taking POs can add MV, thiamine, folate,    Chronic medical problems:    Compensated cirrhosis  Hx HCV, treated  - OP hepatology f/u, needs further counseling on alcohol cessation prior to discharge  - Previously with portal HTN gastropathy but no varices on imaging    Current smoker:  Nicotine patch    HTN:  - Hold home Amlodipine in setting of perforation        Keyri Salberg S [place] [facility]

## 2023-03-30 NOTE — H&P (Addendum)
History and Physical    Patient: Christopher Logan:[id number] DOB: [birth year] DOA: 03/29/2023 DOS: the patient was seen and examined on 03/30/2023 PCP: [name], MD  Patient coming from: Home  Chief Complaint:  Chief Complaint  Patient presents with   Abdominal Pain   Flank Pain   HPI: Christopher Logan is a 62 y.o. male with medical history significant for PUD, portal HTN gastropathy with last EGD 8/'23, anemia, HTN, compensated cirrhosis, treated Hep C, alcohol abuse, current smoking, who presents with acute onset severe epigastric abd pain x 1 day. Acute and abrupt onset of severe epigastric pain, 10/10, radiating to his back this afternoon. Reports nausea and vomiting. Denies hematemesis / coffee ground emesis. No stools, no melena or hematochezia. Has associated SOB thinks related to worsening abd pain with deep breathing. Acknowledges active alcohol and smoking. Not taking anticoagulants. Last EGD per above in 8/'23 with gastric and duodenal ulcer, portal HTN gastropathy.   Review of Systems: As mentioned in the history of present illness. All other systems reviewed and are negative. Past Medical History:  Diagnosis Date   Hypertension    Past Surgical History:  Procedure Laterality Date   BIOPSY  02/12/2022   Procedure: BIOPSY;  Surgeon: Kerin Salen, MD;  Location: [facility] ENDOSCOPY;  Service: Gastroenterology;;   ESOPHAGOGASTRODUODENOSCOPY (EGD) WITH PROPOFOL N/A 02/12/2022   Procedure: ESOPHAGOGASTRODUODENOSCOPY (EGD) WITH PROPOFOL;  Surgeon: Kerin Salen, MD;  Location: [facility] ENDOSCOPY;  Service: Gastroenterology;  Laterality: N/A;   Social History:  reports that he quit smoking about 15 months ago. His smoking use included cigarettes. He started smoking about 46 years ago. He has a 67.9 pack-year smoking history. He has never been exposed to tobacco smoke. He has never used smokeless tobacco. He reports current alcohol use. He reports current drug use. Drug: Marijuana.  Allergies   Allergen Reactions   Bee Venom Anaphylaxis   Lisinopril Swelling    Caused facial swelling   Codeine Nausea Only    Family History  Problem Relation Age of Onset   Cancer Mother     Prior to Admission medications   Medication Sig Start Date End Date Taking? Authorizing Provider  amLODipine (NORVASC) 5 MG tablet Take 5 mg by mouth daily. 02/09/23  Yes [provider]  pantoprazole (PROTONIX) 40 MG tablet Take 1 tablet (40 mg total) by mouth 2 (two) times daily. 02/15/22  Yes Elgergawy, Leana Roe, MD  lisinopril (ZESTRIL) 40 MG tablet Take 40 mg by mouth daily. Patient not taking: Reported on 03/30/2023    [provider]    Physical Exam: Vitals:   03/29/23 2115 03/29/23 2353 03/30/23 0100 03/30/23 0159  BP: 134/80 (!) 146/86 (!) 154/93 (!) 143/95  Pulse: 95 90 86 76  Resp: 11 15 13 18   Temp:  98.1 F (36.7 C)  98.5 F (36.9 C)  TempSrc:  Oral  Oral  SpO2: 95% 94% 100% 100%    Gen: Awake, alert, NAD  CV: Regular, normal S1, S2, no murmurs  Resp: Normal WOB, CTAB  Abd: scaphoid, hyperactive, severe epigastric and RUQ tenderness, with associated guarding and mild rebound. On serial abd exam later in the morning, abd tenderness improved and no current guarding or rebound.  MSK: Symmetric, no edema  Skin: No rashes or lesions to exposed skin  Neuro: Alert and interactive  Psych: euthymic, appropriate   Data Reviewed:   Reviewed, notable for:  K 2.9  Lipase 57  AST 45, other LFT normal  Lactate 1  HS trop neg x 2  Hb 8.7, PLT 123  Imaging reviewed:  CLINICAL DATA:  Emesis flank pain   EXAM: CT ANGIOGRAPHY CHEST, ABDOMEN AND PELVIS   TECHNIQUE: CT 02/11/2022   Multidetector CT imaging through the chest, abdomen and pelvis was performed using the standard protocol during bolus administration of intravenous contrast. Multiplanar reconstructed images and MIPs were obtained and reviewed to evaluate the vascular anatomy.   RADIATION DOSE  REDUCTION: This exam was performed according to the departmental dose-optimization program which includes automated exposure control, adjustment of the mA and/or kV according to patient size and/or use of iterative reconstruction technique.   CONTRAST:  OMNIPAQUE IOHEXOL 350 MG/ML SOLN   COMPARISON:  CT 02/11/2022   FINDINGS: CTA CHEST FINDINGS   Cardiovascular: Non contrasted images of the chest demonstrate no acute intramural hematoma. Moderate aortic atherosclerosis. No aneurysm. No dissection. Coronary vascular calcification. Normal cardiac size. No pericardial effusion.   Mediastinum/Nodes: Midline trachea. No thyroid mass. No suspicious lymph nodes. Subcentimeter AP window lymph nodes. Circumferential mid to distal esophageal thickening.   Lungs/Pleura: No consolidation or effusion. Minimal emphysema. Minimal ground-glass density within the right greater than left lung bases.   Musculoskeletal: No acute osseous abnormality. Probable old sternal fracture   Review of the MIP images confirms the above findings.   CTA ABDOMEN AND PELVIS FINDINGS   VASCULAR   Aorta: Normal caliber aorta without aneurysm, dissection, vasculitis or significant stenosis. Moderate aortic atherosclerosis.   Celiac: Patent without evidence of aneurysm, dissection, vasculitis or significant stenosis. Origin calcification.   SMA: Patent without evidence of aneurysm, dissection, vasculitis or significant stenosis. Origin calcification.   Renals: Single right and single left renal arteries with origin calcification but no stenosis or dissection. 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   IMA: Diminutive but patent.   Inflow: Moderate severe atherosclerosis. Mild narrowing of the right internal iliac artery origin. External iliac arteries are patent.   Veins: No obvious venous abnormality within the limitations of this arterial phase  study.   Review of the MIP images confirms the above findings.   NON-VASCULAR   Hepatobiliary: No calcified gallstone. No biliary dilatation. Possible surface nodularity of the liver.   Pancreas: Unremarkable. No pancreatic ductal dilatation or surrounding inflammatory changes.   Spleen: Normal in size without focal abnormality.   Adrenals/Urinary Tract: Adrenal glands are normal. No hydronephrosis. 7 mm stone lower pole right kidney. Bladder is unremarkable   Stomach/Bowel: The stomach is nonenlarged. Wall thickening and mucosal enhancement of the pylorus. Irregular wall thickening of the duodenal bulb with surrounding inflammatory changes, series 5, image 179. Wall thickening second portion of duodenum with surrounding fluid and stranding. Focal gas and fluid collection measuring 1.7 x 1.3 cm on series 5, image 179 posterior to the duodenal bulb which may reflect an ulcer crater or contained perforation. Remainder of the small bowel is nondistended.   Lymphatic: No suspicious lymph nodes   Reproductive: Negative prostate   Other: Negative for pelvic effusion.  No free gas is visualized.   Musculoskeletal: No acute or suspicious osseous abnormality   Review of the MIP images confirms the above findings.   IMPRESSION: 1. Negative for acute aortic dissection or aneurysm. 2. Wall thickening and mucosal enhancement of the pylorus, first and second portion of duodenum with surrounding inflammatory changes, findings are consistent with gastritis/duodenitis/peptic ulcer disease. Focal gas and fluid collection measuring 1.7 cm posterior to the duodenal bulb which may  reflect an ulcer crater or contained perforation. No free gas visualized on this exam. 3. Circumferential mid to distal esophageal thickening, question esophagitis or reflux. Correlate with endoscopy if not already performed 4. Possible surface nodularity of the liver, correlate for cirrhosis. 5. Nonobstructing  right kidney stone.   Assessment and Plan:  45 M with hx PUD, portal HTN gastropathy with last EGD 8/'23, anemia, HTN, compensated cirrhosis, treated Hep C, alcohol abuse, current smoking, who presents with acute onset of epigastric abd pain, found to have a contained duodenal perforation.   Contained duodenal perforation  Acute, severe epigastric abd pain  Hx of PUD and duodenal ulcer, risk factors including etoh and smoking. Found to have contained duodenal perforation; CT with Focal gas and fluid collection measuring 1.7 cm posterior to the duodenal bulb. Lactate 1. Tachycardia improved with IVF. No other signs of sepsis. Initial abd exam concerning for acute abd, discussed with general surgery who were previously consulted. On serial abd exam improving into the morning.  - General surgery consulted, evaluated by Dr. Hillery Hunter. Discussed case with him again at time of referral for admission. Discussed preferred general surgery admission with medicine consultation which was offered. Surgery feels more comfortable with medicine admission and surgery consultation.  - No immediate plan for operative intervention, close following by general surgery  - Serial abd exam  - Serial lactate  - s/p 500 cc of IVF by ED, give additional 1 L IVF, and start on mIVF at 150 cc/hr of LR x 48 hr  - Zosyn 4.5 g q 6 hr IV for intraabd coverage  - Pantoprazole 40 mg IV q12 hr  - Empiric H pylori treatment following acute management above  - General surgery to order UGI series to eval for extravasation  - Strict NPO   Anemia, acute on chronic  Thrombocytopenia  Baseline Hb variable, last was as high as 12 but previously requiring treatment for anemia associated with PUD. On admission 8.7. Question thrombocytopenia in setting of cirrhosis  - Type and screen  - Trend CBC q 12 hr until stabilized  - Check iron panel, B12   Hypokalemia, asymptomatic, treating  - Repletion, trend BMP   At risk for alcohol  withdrawal  Drinks 40 oz malt liquor daily  - CIWA protocol, if elevated add on prn Ativan  - Once taking POs can add MV, thiamine, folate,   Chronic medical problems:   Compensated cirrhosis  Hx HCV, treated  - OP hepatology f/u, needs further counseling on alcohol cessation prior to discharge  - Previously with portal HTN gastropathy but no varices on imaging   Current smoker:  Nicotine patch   HTN:  - Hold home Amlodipine in setting of perforation     Advance Care Planning:   Code Status: Full Code ; confirmed with patient   Consults: General surgery   Family Communication: No   Severity of Illness: The appropriate patient status for this patient is INPATIENT. Inpatient status is judged to be reasonable and necessary in order to provide the required intensity of service to ensure the patient's safety. The patient's presenting symptoms, physical exam findings, and initial radiographic and laboratory data in the context of their chronic comorbidities is felt to place them at high risk for further clinical deterioration. Furthermore, it is not anticipated that the patient will be medically stable for discharge from the hospital within 2 midnights of admission.   * I certify that at the point of admission it is my clinical judgment  that the patient will require inpatient hospital care spanning beyond 2 midnights from the point of admission due to high intensity of service, high risk for further deterioration and high frequency of surveillance required.*  LOC: stepdown   Author: Dolly Rias, MD 03/30/2023 3:42 AM  For on call review [email or web address].

## 2023-03-30 NOTE — ED Notes (Signed)
Surgeon at bedside.  

## 2023-03-30 NOTE — ED Notes (Signed)
Patient transported to X-ray 

## 2023-03-30 NOTE — ED Notes (Signed)
ED TO INPATIENT HANDOFF REPORT  ED Nurse Name and Phone #: Darvin Neighbours [phone]  S Name/Age/Gender Christopher Logan 62 y.o. male Room/Bed: 010C/010C  Code Status   Code Status: Full Code  Home/SNF/Other Home Patient oriented to: self, place, time, and situation Is this baseline? Yes   Triage Complete: Triage complete  Chief Complaint Duodenal perforation (HCC) [K63.1]  Triage Note Patient arrives for two hours of abdominal and flank pain and "lung pain." States multiple kidney stones and this feels much different. Emesis x 1 and appears very uncomfortable in triage. Endorses sore throat and nasal congestion as well.    Allergies Allergies  Allergen Reactions   Bee Venom Anaphylaxis   Lisinopril Swelling    Caused facial swelling   Codeine Nausea Only    Level of Care/Admitting Diagnosis ED Disposition     ED Disposition  Admit   Condition  --   Comment  Hospital Area: [facility] [[id number]]  Level of Care: Progressive [102]  Admit to Progressive based on following criteria: Other see comments  Comments: Contained duodenal perforation  May admit patient to [facility] if equivalent level of care is available:: No  Covid Evaluation: Asymptomatic - no recent exposure (last 10 days) testing not required  Diagnosis: Duodenal perforation [facility]) [[id number]]  Admitting Physician: Dolly Rias [[id number]]  Attending Physician: Dolly Rias [[id number]]  Certification:: I certify this patient will need inpatient services for at least 2 midnights  Expected Medical Readiness: 04/04/2023          B Medical/Surgery History Past Medical History:  Diagnosis Date   Hypertension    Past Surgical History:  Procedure Laterality Date   BIOPSY  02/12/2022   Procedure: BIOPSY;  Surgeon: Kerin Salen, MD;  Location: [facility] ENDOSCOPY;  Service: Gastroenterology;;   ESOPHAGOGASTRODUODENOSCOPY (EGD) WITH PROPOFOL N/A 02/12/2022   Procedure:  ESOPHAGOGASTRODUODENOSCOPY (EGD) WITH PROPOFOL;  Surgeon: Kerin Salen, MD;  Location: [facility] ENDOSCOPY;  Service: Gastroenterology;  Laterality: N/A;     A IV Location/Drains/Wounds Patient Lines/Drains/Airways Status     Active Line/Drains/Airways     Name Placement date Placement time Site Days   Peripheral IV 03/29/23 20 G Left Antecubital 03/29/23  2006  Antecubital  1            Intake/Output Last 24 hours  Intake/Output Summary (Last 24 hours) at 03/30/2023 1043 Last data filed at 03/30/2023 0758 Gross per 24 hour  Intake 2001.67 ml  Output --  Net 2001.67 ml    Labs/Imaging Results for orders placed or performed during the hospital encounter of 03/29/23 (from the past 48 hour(s))  Lipase, blood     Status: Abnormal   Collection Time: 03/29/23  4:37 PM  Result Value Ref Range   Lipase 57 (H) 11 - 51 U/L    Comment: Performed at [facility], [address] [place], [place] [address]  Comprehensive metabolic panel     Status: Abnormal   Collection Time: 03/29/23  4:37 PM  Result Value Ref Range   Sodium 134 (L) 135 - 145 mmol/L   Potassium 2.9 (L) 3.5 - 5.1 mmol/L   Chloride 96 (L) 98 - 111 mmol/L   CO2 24 22 - 32 mmol/L   Glucose, Bld 89 70 - 99 mg/dL    Comment: Glucose reference range applies only to samples taken after fasting for at least 8 hours.   BUN 9 8 - 23 mg/dL   Creatinine, Ser 8.46 0.61 -  1.24 mg/dL   Calcium 9.2 8.9 - 16.1 mg/dL   Total Protein 7.4 6.5 - 8.1 g/dL   Albumin 3.9 3.5 - 5.0 g/dL   AST 45 (H) 15 - 41 U/L   ALT 18 0 - 44 U/L   Alkaline Phosphatase 32 (L) 38 - 126 U/L   Total Bilirubin 0.6 0.3 - 1.2 mg/dL   GFR, Estimated >09 >60 mL/min    Comment: (NOTE) Calculated using the CKD-EPI Creatinine Equation (2021)    Anion gap 14 5 - 15    Comment: Performed at [facility], [address] [place], [place] [address]  CBC     Status: Abnormal   Collection Time: 03/29/23  4:37 PM  Result Value Ref Range   WBC 9.9 4.0 - 10.5  K/uL   RBC 3.09 (L) 4.22 - 5.81 MIL/uL   Hemoglobin 8.7 (L) 13.0 - 17.0 g/dL   HCT 81.1 (L) 91.4 - 78.2 %   MCV 87.1 80.0 - 100.0 fL   MCH 28.2 26.0 - 34.0 pg   MCHC 32.3 30.0 - 36.0 g/dL   RDW 95.6 (H) 21.3 - 08.6 %   Platelets 123 (L) 150 - 400 K/uL    Comment: SPECIMEN CHECKED FOR CLOTS REPEATED TO VERIFY    nRBC 0.0 0.0 - 0.2 %    Comment: Performed at [facility], [address] [place], [place] [address]  Troponin I (High Sensitivity)     Status: None   Collection Time: 03/29/23  4:37 PM  Result Value Ref Range   Troponin I (High Sensitivity) 11 <18 ng/L    Comment: (NOTE) Elevated high sensitivity troponin I (hsTnI) values and significant  changes across serial measurements may suggest ACS but many other  chronic and acute conditions are known to elevate hsTnI results.  Refer to the "Links" section for chest pain algorithms and additional  guidance. Performed at [facility], [address] [place], [place] [address]   Urinalysis, Routine w reflex microscopic -Urine, Clean Catch     Status: Abnormal   Collection Time: 03/29/23  5:30 PM  Result Value Ref Range   Color, Urine YELLOW YELLOW   APPearance CLEAR CLEAR   Specific Gravity, Urine 1.012 1.005 - 1.030   pH 5.0 5.0 - 8.0   Glucose, UA NEGATIVE NEGATIVE mg/dL   Hgb urine dipstick NEGATIVE NEGATIVE   Bilirubin Urine NEGATIVE NEGATIVE   Ketones, ur NEGATIVE NEGATIVE mg/dL   Protein, ur 30 (A) NEGATIVE mg/dL   Nitrite NEGATIVE NEGATIVE   Leukocytes,Ua NEGATIVE NEGATIVE   RBC / HPF 0-5 0 - 5 RBC/hpf   WBC, UA 0-5 0 - 5 WBC/hpf   Bacteria, UA NONE SEEN NONE SEEN   Squamous Epithelial / HPF 0-5 0 - 5 /HPF   Mucus PRESENT    Hyaline Casts, UA PRESENT     Comment: Performed at [facility], [address], [place], [place] [address]  Troponin I (High Sensitivity)     Status: None   Collection Time: 03/29/23  8:04 PM  Result Value Ref Range   Troponin I (High Sensitivity) 11 <18 ng/L     Comment: (NOTE) Elevated high sensitivity troponin I (hsTnI) values and significant  changes across serial measurements may suggest ACS but many other  chronic and acute conditions are known to elevate hsTnI results.  Refer to the "Links" section for chest pain algorithms and additional  guidance. Performed at [facility], [address] [place], [place] [address]   Comprehensive metabolic panel     Status: Abnormal   Collection Time: 03/30/23  2:51 AM  Result Value Ref Range   Sodium 134 (L) 135 - 145 mmol/L   Potassium 3.3 (L) 3.5 - 5.1 mmol/L   Chloride 98 98 - 111 mmol/L   CO2 26 22 - 32 mmol/L   Glucose, Bld 95 70 - 99 mg/dL    Comment: Glucose reference range applies only to samples taken after fasting for at least 8 hours.   BUN 6 (L) 8 - 23 mg/dL   Creatinine, Ser 4.54 0.61 - 1.24 mg/dL   Calcium 8.1 (L) 8.9 - 10.3 mg/dL   Total Protein 6.3 (L) 6.5 - 8.1 g/dL   Albumin 3.2 (L) 3.5 - 5.0 g/dL   AST 34 15 - 41 U/L   ALT 15 0 - 44 U/L   Alkaline Phosphatase 26 (L) 38 - 126 U/L   Total Bilirubin 0.7 0.3 - 1.2 mg/dL   GFR, Estimated >09 >81 mL/min    Comment: (NOTE) Calculated using the CKD-EPI Creatinine Equation (2021)    Anion gap 10 5 - 15    Comment: Performed at [facility], [address] [place], [place] [address]  Protime-INR     Status: None   Collection Time: 03/30/23  2:51 AM  Result Value Ref Range   Prothrombin Time 13.9 11.4 - 15.2 seconds   INR 1.1 0.8 - 1.2    Comment: (NOTE) INR goal varies based on device and disease states. Performed at [facility], [address], [place], [place] [address]   Lactic acid, plasma     Status: None   Collection Time: 03/30/23  2:51 AM  Result Value Ref Range   Lactic Acid, Venous 1.0 0.5 - 1.9 mmol/L    Comment: Performed at [facility], [address], [place], [place] [address]  Ferritin     Status: None   Collection Time: 03/30/23  2:51 AM  Result Value Ref Range   Ferritin 31 24  - 336 ng/mL    Comment: Performed at [facility], [address], [place], [place] [address]  Transferrin     Status: None   Collection Time: 03/30/23  2:51 AM  Result Value Ref Range   Transferrin 299 180 - 329 mg/dL    Comment: Performed at [facility], [address], [place], [place] [address]  Iron and TIBC     Status: Abnormal   Collection Time: 03/30/23  2:51 AM  Result Value Ref Range   Iron 34 (L) 45 - 182 ug/dL   TIBC 528 413 - 244 ug/dL   Saturation Ratios 8 (L) 17.9 - 39.5 %   UIBC 387 ug/dL    Comment: Performed at [facility], [address] [place], [place] [address]  Vitamin B12     Status: Abnormal   Collection Time: 03/30/23  2:51 AM  Result Value Ref Range   Vitamin B-12 130 (L) 180 - 914 pg/mL    Comment: (NOTE) This assay is not validated for testing neonatal or myeloproliferative syndrome specimens for Vitamin B12 levels. Performed at [facility], [address], [place], [place] [address]   HIV Antibody (routine testing w rflx)     Status: None   Collection Time: 03/30/23  3:08 AM  Result Value Ref Range   HIV Screen 4th Generation wRfx Non Reactive Non  Reactive    Comment: Performed at [facility], [address], [place], [place] [address]  Type and screen [facility]     Status: None   Collection Time: 03/30/23  7:12 AM  Result Value Ref Range   ABO/RH(D) A POS    Antibody Screen NEG    Sample Expiration      04/02/2023,2359 Performed at [facility], [address], [place], [place] [address]   Lactic acid, plasma     Status: None   Collection Time: 03/30/23  7:16 AM  Result Value Ref Range   Lactic Acid, Venous 0.9 0.5 - 1.9 mmol/L    Comment: Performed at [facility], [address] [place], [place] [address]   CT Angio Chest/Abd/Pel for Dissection W and/or Wo Contrast  Result Date: 03/29/2023 CLINICAL DATA:  Emesis flank pain EXAM: CT ANGIOGRAPHY CHEST, ABDOMEN AND PELVIS  TECHNIQUE: CT 02/11/2022 Multidetector CT imaging through the chest, abdomen and pelvis was performed using the standard protocol during bolus administration of intravenous contrast. Multiplanar reconstructed images and MIPs were obtained and reviewed to evaluate the vascular anatomy. RADIATION DOSE REDUCTION: This exam was performed according to the departmental dose-optimization program which includes automated exposure control, adjustment of the mA and/or kV according to patient size and/or use of iterative reconstruction technique. CONTRAST:  OMNIPAQUE IOHEXOL 350 MG/ML SOLN COMPARISON:  CT 02/11/2022 FINDINGS: CTA CHEST FINDINGS Cardiovascular: Non contrasted images of the chest demonstrate no acute intramural hematoma. Moderate aortic atherosclerosis. No aneurysm. No dissection. Coronary vascular calcification. Normal cardiac size. No pericardial effusion. Mediastinum/Nodes: Midline trachea. No thyroid mass. No suspicious lymph nodes. Subcentimeter AP window lymph nodes. Circumferential mid to distal esophageal thickening. Lungs/Pleura: No consolidation or effusion. Minimal emphysema. Minimal ground-glass density within the right greater than left lung bases. Musculoskeletal: No acute osseous abnormality. Probable old sternal fracture Review of the MIP images confirms the above findings. CTA ABDOMEN AND PELVIS FINDINGS VASCULAR Aorta: Normal caliber aorta without aneurysm, dissection, vasculitis or significant stenosis. Moderate aortic atherosclerosis. Celiac: Patent without evidence of aneurysm, dissection, vasculitis or significant stenosis. Origin calcification. SMA: Patent without evidence of aneurysm, dissection, vasculitis or significant stenosis. Origin calcification. Renals: Single right and single left renal arteries with origin calcification but no stenosis or dissection. 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 IMA: Diminutive but patent. Inflow:  Moderate severe atherosclerosis. Mild narrowing of the right internal iliac artery origin. External iliac arteries are patent. Veins: No obvious venous abnormality within the limitations of this arterial phase study. Review of the MIP images confirms the above findings. NON-VASCULAR Hepatobiliary: No calcified gallstone. No biliary dilatation. Possible surface nodularity of the liver. Pancreas: Unremarkable. No pancreatic ductal dilatation or surrounding inflammatory changes. Spleen: Normal in size without focal abnormality. Adrenals/Urinary Tract: Adrenal glands are normal. No hydronephrosis. 7 mm stone lower pole right kidney. Bladder is unremarkable Stomach/Bowel: The stomach is nonenlarged. Wall thickening and mucosal enhancement of the pylorus. Irregular wall thickening of the duodenal bulb with surrounding inflammatory changes, series 5, image 179. Wall thickening second portion of duodenum with surrounding fluid and stranding. Focal gas and fluid collection measuring 1.7 x 1.3 cm on series 5, image 179 posterior to the duodenal bulb which may reflect an ulcer crater or contained perforation. Remainder of the small bowel is nondistended. Lymphatic: No suspicious lymph nodes Reproductive: Negative prostate Other: Negative for pelvic effusion.  No free gas is visualized. Musculoskeletal: No  acute or suspicious osseous abnormality Review of the MIP images confirms the above findings. IMPRESSION: 1. Negative for acute aortic dissection or aneurysm. 2. Wall thickening and mucosal enhancement of the pylorus, first and second portion of duodenum with surrounding inflammatory changes, findings are consistent with gastritis/duodenitis/peptic ulcer disease. Focal gas and fluid collection measuring 1.7 cm posterior to the duodenal bulb which may reflect an ulcer crater or contained perforation. No free gas visualized on this exam. 3. Circumferential mid to distal esophageal thickening, question esophagitis or reflux.  Correlate with endoscopy if not already performed 4. Possible surface nodularity of the liver, correlate for cirrhosis. 5. Nonobstructing right kidney stone. Aortic Atherosclerosis (ICD10-I70.0) and Emphysema (ICD10-J43.9). Electronically Signed   By: Jasmine Pang M.D.   On: 03/29/2023 21:18    Pending Labs Unresulted Labs (From admission, onward)     Start     Ordered   03/30/23 0500  CBC  Once,   R        03/30/23 0500            Vitals/Pain Today's Vitals   03/30/23 0800 03/30/23 0801 03/30/23 0915 03/30/23 0917  BP: (!) 141/81  (!) 166/90   Pulse: 76  73   Resp: 18  12   Temp:    98 F (36.7 C)  TempSrc:    Oral  SpO2: 100%  100%   Weight:      Height:      PainSc:  7       Isolation Precautions No active isolations  Medications Medications  sodium chloride flush (NS) 0.9 % injection 3 mL (3 mLs Intravenous Given 03/30/23 0917)  HYDROmorphone (DILAUDID) injection 0.5 mg ( Intravenous See Alternative 03/30/23 0758)    Or  HYDROmorphone (DILAUDID) injection 1 mg (1 mg Intravenous Given 03/30/23 0758)  nicotine (NICODERM CQ - dosed in mg/24 hours) patch 14 mg (14 mg Transdermal Patch Applied 03/30/23 0158)  lactated ringers infusion ( Intravenous New Bag/Given 03/30/23 0427)  piperacillin-tazobactam (ZOSYN) 4.5 g in dextrose 5 % 50 mL IVPB (0 g Intravenous Stopped 03/30/23 0758)  piperacillin-tazobactam (ZOSYN) IVPB 3.375 g (has no administration in time range)  pantoprazole (PROTONIX) injection 40 mg (40 mg Intravenous Given 03/30/23 0917)  ondansetron (ZOFRAN-ODT) disintegrating tablet 4 mg (4 mg Oral Given 03/29/23 1614)  HYDROmorphone (DILAUDID) injection 1 mg (1 mg Intravenous Given 03/29/23 2006)  ketorolac (TORADOL) injection 30 mg (30 mg Intramuscular Given 03/29/23 2009)  iohexol (OMNIPAQUE) 350 MG/ML injection 100 mL (100 mLs Intravenous Contrast Given 03/29/23 2019)  potassium chloride SA (KLOR-CON M) CR tablet 40 mEq (40 mEq Oral Given 03/29/23 2157)  sodium  chloride 0.9 % bolus 500 mL (0 mLs Intravenous Stopped 03/30/23 0153)  famotidine (PEPCID) IVPB 20 mg premix (0 mg Intravenous Stopped 03/30/23 0153)  piperacillin-tazobactam (ZOSYN) IVPB 3.375 g (0 g Intravenous Stopped 03/30/23 0058)  HYDROmorphone (DILAUDID) injection 1 mg (1 mg Intravenous Given 03/30/23 0056)  potassium chloride 10 mEq in 100 mL IVPB (0 mEq Intravenous Stopped 03/30/23 0644)  lactated ringers bolus 1,000 mL (0 mLs Intravenous Stopped 03/30/23 0306)  iohexol (OMNIPAQUE) 300 MG/ML solution 100 mL (100 mLs Oral Contrast Given 03/30/23 0913)    Mobility walks     Focused Assessments     R Recommendations: See Admitting Provider Note  Report given to:   Additional Notes:

## 2023-03-30 NOTE — ED Notes (Signed)
Called regarding Zosyn coming from pharmacy, they advised they were in the process of tubing it up.

## 2023-03-30 NOTE — Plan of Care (Signed)
Received pt from ED alert and oriented on room air no distress noted pt able to transfer self from stretcher to bed. C/O RLQ abd pain meds given as ordered Skin assessment complete with Hessie Diener charge nurse bruising bilateral upper and lower extremities, Pt NPO will continue to monitor

## 2023-03-30 NOTE — Progress Notes (Signed)
Subjective: Patient having some RUQ abdominal pain, intermittent at pain meds wear off.  No nausea or vomiting.    ROS: See above, otherwise other systems negative  Objective: Vital signs in last 24 hours: Temp:  [98.1 F (36.7 C)-98.7 F (37.1 C)] 98.2 F (36.8 C) (09/12 0433) Pulse Rate:  [56-101] 69 (09/12 0720) Resp:  [11-23] 18 (09/12 0715) BP: (134-168)/(70-98) 155/78 (09/12 0720) SpO2:  [94 %-100 %] 97 % (09/12 0729) Weight:  [65.8 kg] 65.8 kg (09/12 0718) Last BM Date : 03/29/23  Intake/Output from previous day: 09/11 0701 - 09/12 0700 In: 1950 [IV Piggyback:1950] Out: -  Intake/Output this shift: No intake/output data recorded.  PE: Gen: NAD, laying in bed Abd: soft, tender as expected in RUQ with no peritonitis, ND  Lab Results:  Recent Labs    03/29/23 1637  WBC 9.9  HGB 8.7*  HCT 26.9*  PLT 123*   BMET Recent Labs    03/29/23 1637 03/30/23 0251  NA 134* 134*  K 2.9* 3.3*  CL 96* 98  CO2 24 26  GLUCOSE 89 95  BUN 9 6*  CREATININE 0.78 0.62  CALCIUM 9.2 8.1*   PT/INR Recent Labs    03/30/23 0251  LABPROT 13.9  INR 1.1   CMP     Component Value Date/Time   NA 134 (L) 03/30/2023 0251   K 3.3 (L) 03/30/2023 0251   CL 98 03/30/2023 0251   CO2 26 03/30/2023 0251   GLUCOSE 95 03/30/2023 0251   BUN 6 (L) 03/30/2023 0251   CREATININE 0.62 03/30/2023 0251   CALCIUM 8.1 (L) 03/30/2023 0251   PROT 6.3 (L) 03/30/2023 0251   ALBUMIN 3.2 (L) 03/30/2023 0251   AST 34 03/30/2023 0251   ALT 15 03/30/2023 0251   ALKPHOS 26 (L) 03/30/2023 0251   BILITOT 0.7 03/30/2023 0251   GFRNONAA >60 03/30/2023 0251   GFRAA >60 03/29/2020 1502   Lipase     Component Value Date/Time   LIPASE 57 (H) 03/29/2023 1637       Studies/Results: CT Angio Chest/Abd/Pel for Dissection W and/or Wo Contrast  Result Date: 03/29/2023 CLINICAL DATA:  Emesis flank pain EXAM: CT ANGIOGRAPHY CHEST, ABDOMEN AND PELVIS TECHNIQUE: CT 02/11/2022 Multidetector CT  imaging through the chest, abdomen and pelvis was performed using the standard protocol during bolus administration of intravenous contrast. Multiplanar reconstructed images and MIPs were obtained and reviewed to evaluate the vascular anatomy. RADIATION DOSE REDUCTION: This exam was performed according to the departmental dose-optimization program which includes automated exposure control, adjustment of the mA and/or kV according to patient size and/or use of iterative reconstruction technique. CONTRAST:  OMNIPAQUE IOHEXOL 350 MG/ML SOLN COMPARISON:  CT 02/11/2022 FINDINGS: CTA CHEST FINDINGS Cardiovascular: Non contrasted images of the chest demonstrate no acute intramural hematoma. Moderate aortic atherosclerosis. No aneurysm. No dissection. Coronary vascular calcification. Normal cardiac size. No pericardial effusion. Mediastinum/Nodes: Midline trachea. No thyroid mass. No suspicious lymph nodes. Subcentimeter AP window lymph nodes. Circumferential mid to distal esophageal thickening. Lungs/Pleura: No consolidation or effusion. Minimal emphysema. Minimal ground-glass density within the right greater than left lung bases. Musculoskeletal: No acute osseous abnormality. Probable old sternal fracture Review of the MIP images confirms the above findings. CTA ABDOMEN AND PELVIS FINDINGS VASCULAR Aorta: Normal caliber aorta without aneurysm, dissection, vasculitis or significant stenosis. Moderate aortic atherosclerosis. Celiac: Patent without evidence of aneurysm, dissection, vasculitis or significant stenosis. Origin calcification. SMA: Patent without evidence of aneurysm, dissection, vasculitis or  significant stenosis. Origin calcification. Renals: Single right and single left renal arteries with origin calcification but no stenosis or dissection. 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 IMA: Diminutive but patent. Inflow: Moderate severe atherosclerosis. Mild  narrowing of the right internal iliac artery origin. External iliac arteries are patent. Veins: No obvious venous abnormality within the limitations of this arterial phase study. Review of the MIP images confirms the above findings. NON-VASCULAR Hepatobiliary: No calcified gallstone. No biliary dilatation. Possible surface nodularity of the liver. Pancreas: Unremarkable. No pancreatic ductal dilatation or surrounding inflammatory changes. Spleen: Normal in size without focal abnormality. Adrenals/Urinary Tract: Adrenal glands are normal. No hydronephrosis. 7 mm stone lower pole right kidney. Bladder is unremarkable Stomach/Bowel: The stomach is nonenlarged. Wall thickening and mucosal enhancement of the pylorus. Irregular wall thickening of the duodenal bulb with surrounding inflammatory changes, series 5, image 179. Wall thickening second portion of duodenum with surrounding fluid and stranding. Focal gas and fluid collection measuring 1.7 x 1.3 cm on series 5, image 179 posterior to the duodenal bulb which may reflect an ulcer crater or contained perforation. Remainder of the small bowel is nondistended. Lymphatic: No suspicious lymph nodes Reproductive: Negative prostate Other: Negative for pelvic effusion.  No free gas is visualized. Musculoskeletal: No acute or suspicious osseous abnormality Review of the MIP images confirms the above findings. IMPRESSION: 1. Negative for acute aortic dissection or aneurysm. 2. Wall thickening and mucosal enhancement of the pylorus, first and second portion of duodenum with surrounding inflammatory changes, findings are consistent with gastritis/duodenitis/peptic ulcer disease. Focal gas and fluid collection measuring 1.7 cm posterior to the duodenal bulb which may reflect an ulcer crater or contained perforation. No free gas visualized on this exam. 3. Circumferential mid to distal esophageal thickening, question esophagitis or reflux. Correlate with endoscopy if not already  performed 4. Possible surface nodularity of the liver, correlate for cirrhosis. 5. Nonobstructing right kidney stone. Aortic Atherosclerosis (ICD10-I70.0) and Emphysema (ICD10-J43.9). Electronically Signed   By: Jasmine Pang M.D.   On: 03/29/2023 21:18    Anti-infectives: Anti-infectives (From admission, onward)    Start     Dose/Rate Route Frequency Ordered Stop   03/30/23 2200  piperacillin-tazobactam (ZOSYN) IVPB 3.375 g        3.375 g 12.5 mL/hr over 240 Minutes Intravenous Every 8 hours 03/30/23 0726     03/30/23 0800  piperacillin-tazobactam (ZOSYN) IVPB 3.375 g  Status:  Discontinued        3.375 g 12.5 mL/hr over 240 Minutes Intravenous Every 8 hours 03/30/23 0111 03/30/23 0538   03/30/23 0730  piperacillin-tazobactam (ZOSYN) 4.5 g in dextrose 5 % 50 mL IVPB        4.5 g 100 mL/hr over 30 Minutes Intravenous Every 8 hours 03/30/23 0725 03/30/23 2159   03/30/23 0630  piperacillin-tazobactam (ZOSYN) 4.5 g in dextrose 5 % 50 mL IVPB  Status:  Discontinued        4.5 g 100 mL/hr over 30 Minutes Intravenous Every 8 hours 03/30/23 0616 03/30/23 0725   03/30/23 0600  piperacillin-tazobactam (ZOSYN) IVPB 4.5 g  Status:  Discontinued        4.5 g 200 mL/hr over 30 Minutes Intravenous Every 8 hours 03/30/23 0538 03/30/23 0616   03/30/23 0000  fluconazole (DIFLUCAN) tablet 100 mg  Status:  Discontinued        100 mg Oral  Once 03/29/23 2354 03/29/23 2355   03/30/23 0000  piperacillin-tazobactam (ZOSYN) IVPB  3.375 g        3.375 g 100 mL/hr over 30 Minutes Intravenous  Once 03/29/23 2358 03/30/23 0058        Assessment/Plan Contained duodenal perforation, likely secondary to ulcer disease -known history of ulcer disease, smokes and drinks 1/5 liquor daily -BID IV PPI -IV abx therapy -STRICT NPO -plan for UGI over the next several days to eval for leak -if develops distention, may need NGT for decompression but think we can avoid this for now -monitor closely   FEN - NPO/IVFs/IV  protonix VTE - ok for chemical prophylaxis from our standpoint ID - [email or web address]  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   LOS: 0 days    Letha Cape , [facility] 03/30/2023, 7:57 AM Please see Amion for pager number during day hours 7:00am-4:30pm or 7:00am -11:30am on weekends

## 2023-03-31 ENCOUNTER — Inpatient Hospital Stay (HOSPITAL_COMMUNITY): Payer: Self-pay

## 2023-03-31 LAB — CBC WITH DIFFERENTIAL/PLATELET
Abs Immature Granulocytes: 0.11 10*3/uL — ABNORMAL HIGH (ref 0.00–0.07)
Basophils Absolute: 0.1 10*3/uL (ref 0.0–0.1)
Basophils Relative: 1 %
Eosinophils Absolute: 0.2 10*3/uL (ref 0.0–0.5)
Eosinophils Relative: 2 %
HCT: 25.7 % — ABNORMAL LOW (ref 39.0–52.0)
Hemoglobin: 8.5 g/dL — ABNORMAL LOW (ref 13.0–17.0)
Immature Granulocytes: 1 %
Lymphocytes Relative: 12 %
Lymphs Abs: 1 10*3/uL (ref 0.7–4.0)
MCH: 29.4 pg (ref 26.0–34.0)
MCHC: 33.1 g/dL (ref 30.0–36.0)
MCV: 88.9 fL (ref 80.0–100.0)
Monocytes Absolute: 1.2 10*3/uL — ABNORMAL HIGH (ref 0.1–1.0)
Monocytes Relative: 15 %
Neutro Abs: 5.6 10*3/uL (ref 1.7–7.7)
Neutrophils Relative %: 69 %
Platelets: 108 10*3/uL — ABNORMAL LOW (ref 150–400)
RBC: 2.89 MIL/uL — ABNORMAL LOW (ref 4.22–5.81)
RDW: 16.1 % — ABNORMAL HIGH (ref 11.5–15.5)
WBC: 8.2 10*3/uL (ref 4.0–10.5)
nRBC: 0 % (ref 0.0–0.2)

## 2023-03-31 LAB — COMPREHENSIVE METABOLIC PANEL
ALT: 15 U/L (ref 0–44)
AST: 31 U/L (ref 15–41)
Albumin: 3 g/dL — ABNORMAL LOW (ref 3.5–5.0)
Alkaline Phosphatase: 28 U/L — ABNORMAL LOW (ref 38–126)
Anion gap: 10 (ref 5–15)
BUN: 7 mg/dL — ABNORMAL LOW (ref 8–23)
CO2: 29 mmol/L (ref 22–32)
Calcium: 8 mg/dL — ABNORMAL LOW (ref 8.9–10.3)
Chloride: 91 mmol/L — ABNORMAL LOW (ref 98–111)
Creatinine, Ser: 0.79 mg/dL (ref 0.61–1.24)
GFR, Estimated: >60 mL/min (ref >60)
Glucose, Bld: 77 mg/dL (ref 70–99)
Potassium: 3.5 mmol/L (ref 3.5–5.1)
Sodium: 130 mmol/L — ABNORMAL LOW (ref 135–145)
Total Bilirubin: 0.8 mg/dL (ref 0.3–1.2)
Total Protein: 6.4 g/dL — ABNORMAL LOW (ref 6.5–8.1)

## 2023-03-31 LAB — PROCALCITONIN: Procalcitonin: <0.1 ng/mL

## 2023-03-31 LAB — C-REACTIVE PROTEIN: CRP: 2.1 mg/dL — ABNORMAL HIGH (ref <1.0)

## 2023-03-31 LAB — OSMOLALITY: Osmolality: 279 mosm/kg (ref 275–295)

## 2023-03-31 LAB — BRAIN NATRIURETIC PEPTIDE: B Natriuretic Peptide: 107.1 pg/mL — ABNORMAL HIGH (ref 0.0–100.0)

## 2023-03-31 LAB — CREATININE, URINE, RANDOM: Creatinine, Urine: 157 mg/dL

## 2023-03-31 LAB — MAGNESIUM: Magnesium: 1.5 mg/dL — ABNORMAL LOW (ref 1.7–2.4)

## 2023-03-31 LAB — URIC ACID: Uric Acid, Serum: 2.3 mg/dL — ABNORMAL LOW (ref 3.7–8.6)

## 2023-03-31 LAB — SODIUM, URINE, RANDOM: Sodium, Ur: 41 mmol/L

## 2023-03-31 LAB — OSMOLALITY, URINE: Osmolality, Ur: 471 mosm/kg (ref 300–900)

## 2023-03-31 MED ORDER — FOLIC ACID 1 MG PO TABS: 1.0000 mg | ORAL_TABLET | Freq: Every day | ORAL | Status: DC

## 2023-03-31 MED ORDER — PHENOBARBITAL SODIUM 130 MG/ML IJ SOLN
65.0000 mg | Freq: Three times a day (TID) | INTRAMUSCULAR | Status: AC
  Administered 2023-03-31 – 2023-04-02 (×6): 65 mg via INTRAVENOUS
  Filled 2023-03-31 (×6): qty 1

## 2023-03-31 MED ORDER — ADULT MULTIVITAMIN W/MINERALS CH
1.0000 | ORAL_TABLET | Freq: Every day | ORAL | Status: DC
  Administered 2023-04-02 – 2023-04-04 (×3): 1 via ORAL
  Filled 2023-03-31 (×3): qty 1

## 2023-03-31 MED ORDER — THIAMINE MONONITRATE 100 MG PO TABS
100.0000 mg | ORAL_TABLET | Freq: Every day | ORAL | Status: DC
  Administered 2023-04-03 – 2023-04-04 (×2): 100 mg via ORAL
  Filled 2023-03-31 (×2): qty 1

## 2023-03-31 MED ORDER — LORAZEPAM 2 MG/ML IJ SOLN
1.0000 mg | INTRAMUSCULAR | Status: AC | PRN
  Filled 2023-03-31: qty 1

## 2023-03-31 MED ORDER — LORAZEPAM 2 MG/ML IJ SOLN
0.0000 mg | Freq: Four times a day (QID) | INTRAMUSCULAR | Status: DC
  Administered 2023-03-31 – 2023-04-02 (×6): 1 mg via INTRAVENOUS
  Filled 2023-03-31 (×6): qty 1

## 2023-03-31 MED ORDER — THIAMINE HCL 100 MG/ML IJ SOLN
100.0000 mg | Freq: Every day | INTRAMUSCULAR | Status: DC
  Administered 2023-03-31 – 2023-04-02 (×3): 100 mg via INTRAVENOUS
  Filled 2023-03-31 (×3): qty 2

## 2023-03-31 MED ORDER — PHENOBARBITAL SODIUM 130 MG/ML IJ SOLN: 65.0000 mg | Freq: Three times a day (TID) | INTRAMUSCULAR | Status: DC

## 2023-03-31 MED ORDER — LORAZEPAM 1 MG PO TABS
1.0000 mg | ORAL_TABLET | ORAL | Status: AC | PRN
  Administered 2023-04-03: 1 mg via ORAL
  Administered 2023-04-03: 2 mg via ORAL
  Filled 2023-03-31: qty 1
  Filled 2023-03-31: qty 2

## 2023-03-31 MED ORDER — PHENOBARBITAL SODIUM 65 MG/ML IJ SOLN
32.5000 mg | Freq: Three times a day (TID) | INTRAMUSCULAR | Status: AC
  Administered 2023-04-02 – 2023-04-03 (×6): 32.5 mg via INTRAVENOUS
  Filled 2023-03-31 (×6): qty 1

## 2023-03-31 MED ORDER — ACETAMINOPHEN 325 MG PO TABS: 650.0000 mg | ORAL_TABLET | Freq: Four times a day (QID) | ORAL | Status: DC | PRN

## 2023-03-31 MED ORDER — FOLIC ACID 5 MG/ML IJ SOLN
1.0000 mg | Freq: Every day | INTRAMUSCULAR | Status: DC
  Administered 2023-03-31 – 2023-04-02 (×3): 1 mg via INTRAVENOUS
  Filled 2023-03-31 (×6): qty 0.2

## 2023-03-31 MED ORDER — CYANOCOBALAMIN 1000 MCG/ML IJ SOLN
1000.0000 ug | Freq: Every day | INTRAMUSCULAR | Status: DC
  Administered 2023-03-31 – 2023-04-04 (×5): 1000 ug via SUBCUTANEOUS
  Filled 2023-03-31 (×5): qty 1

## 2023-03-31 MED ORDER — LACTATED RINGERS IV SOLN: INTRAVENOUS | Status: DC

## 2023-03-31 MED ORDER — HYDRALAZINE HCL 20 MG/ML IJ SOLN: 10.0000 mg | Freq: Four times a day (QID) | INTRAMUSCULAR | Status: DC | PRN

## 2023-03-31 MED ORDER — LORAZEPAM 2 MG/ML IJ SOLN: 0.0000 mg | Freq: Two times a day (BID) | INTRAMUSCULAR | Status: DC

## 2023-03-31 MED ORDER — MAGNESIUM SULFATE 4 GM/100ML IV SOLN
4.0000 g | Freq: Once | INTRAVENOUS | Status: AC
  Administered 2023-03-31: 4 g via INTRAVENOUS
  Filled 2023-03-31: qty 100

## 2023-03-31 MED ORDER — HEPARIN SODIUM (PORCINE) 5000 UNIT/ML IJ SOLN
5000.0000 [IU] | Freq: Three times a day (TID) | INTRAMUSCULAR | Status: DC
  Administered 2023-03-31 – 2023-04-04 (×13): 5000 [IU] via SUBCUTANEOUS
  Filled 2023-03-31 (×13): qty 1

## 2023-03-31 NOTE — Progress Notes (Signed)
PROGRESS NOTE                                                                                                                                                                                                             Patient Demographics:    Christopher Logan, is a 62 y.o. male, DOB - [birth year], WUJ:[id number]  Outpatient Primary MD for the patient is [name], MD    LOS - 1  Admit date - 03/29/2023    Chief Complaint  Patient presents with   Abdominal Pain   Flank Pain       Brief Narrative (HPI from H&P)     62 y.o. male with medical history significant for PUD, portal HTN gastropathy with last EGD 8/'23, anemia, HTN, compensated cirrhosis, treated Hep C, alcohol abuse, current smoking, who presents with acute onset severe epigastric abd pain x 1 day. Acute and abrupt onset of severe epigastric pain, 10/10, radiating to his back starting on the afternoon of admission.  ER workup suggestive of contained upper GI perforation, surgery was consulted and he was admitted for further care.   Subjective:    Christopher Logan today has, No headache, No chest pain, +ve abdominal pain - No Nausea, No new weakness tingling or numbness, no SOB   Assessment  & Plan :    Contained duodenal perforation - Acute, severe epigastric abd pain - Rest, IV PPI, general surgery on board, cardiology consulted, being treated conservatively, continue IV fluids, IV antibiotics and monitor clinically.   Anemia, acute on chronic , Thrombocytopenia - has evidence of both iron deficiency and B12 deficiency, replace B12, placed on oral iron once he is better.    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  Check serum osmolality, uric acid and urine electrolytes.  Monitor.  May need to give as needed IV Lasix for free water excretion.    Alcohol abuse.  Counseled to quit.  CIWA  protocol, IV fluids and thiamine and folic acid.  Will place on IV phenobarb as well.   Compensated cirrhosis, Hx HCV, treated  - follows with [facility].  Consulted.  No acute issues   Current smoker: Nicotine patch, counseled to quit.   HTN: - As needed IV hydralazine        Condition -  Fair  Family Communication  : Merlene Laughter [phone]  on  03/31/2023  Code Status :  Full  Consults  :  CCS, GI  PUD Prophylaxis : PPI   Procedures  :     CTA -  1. Negative for acute aortic dissection or aneurysm. 2. Wall thickening and mucosal enhancement of the pylorus, first and second portion of duodenum with surrounding inflammatory changes, findings are consistent with gastritis/duodenitis/peptic ulcer disease. Focal gas and fluid collection measuring 1.7 cm posterior to the duodenal bulb which may reflect an ulcer crater or contained perforation. No free gas visualized on this exam. 3. Circumferential mid to distal esophageal thickening, question esophagitis or reflux. Correlate with endoscopy if not already performed 4. Possible surface nodularity of the liver, correlate for cirrhosis. 5. Nonobstructing right kidney stone. Aortic Atherosclerosis (ICD10-I70.0) and Emphysema   Barium -  Irregular narrowing of the proximal duodenum consistent with known segment of inflammation. No extravasation of contrast is identified to indicate continued leak      Disposition Plan  :    Status is: Inpatient   DVT Prophylaxis  :    SCDs Start: 03/30/23 0109    Lab Results  Component Value Date   PLT 108 (L) 03/31/2023    Diet :  Diet Order             Diet NPO time specified  Diet effective now                    Inpatient Medications  Scheduled Meds:  nicotine  14 mg Transdermal Daily   pantoprazole (PROTONIX) IV  40 mg Intravenous Q12H   sodium chloride flush  3 mL Intravenous Q12H   Continuous Infusions:  lactated ringers     magnesium sulfate bolus IVPB      piperacillin-tazobactam (ZOSYN)  IV 3.375 g (03/31/23 0514)   PRN Meds:.HYDROmorphone (DILAUDID) injection **OR** HYDROmorphone (DILAUDID) injection, ondansetron (ZOFRAN) IV     Objective:   Vitals:   03/30/23 1100 03/30/23 2000 03/31/23 0000 03/31/23 0400  BP: (!) 174/88 (!) 144/88 137/81 132/73  Pulse: 81 68 72 72  Resp: (!) 21 16 16 18   Temp:  98.1 F (36.7 C) 98.2 F (36.8 C)   TempSrc:  Oral Oral   SpO2: 100% 95% 98% 100%  Weight:      Height:        Wt Readings from Last 3 Encounters:  03/30/23 65.8 kg  10/07/22 63.5 kg  02/12/22 62.5 kg     Intake/Output Summary (Last 24 hours) at 03/31/2023 4401 Last data filed at 03/31/2023 0515 Gross per 24 hour  Intake --  Output 1300 ml  Net -1300 ml     Physical Exam  Awake Alert, No new F.N deficits, Normal affect [place].AT,PERRAL Supple Neck, No JVD,   Symmetrical Chest wall movement, Good air movement bilaterally, CTAB RRR,No Gallops,Rubs or new Murmurs,  +ve B.Sounds, Abd Soft, No tenderness,   No Cyanosis, Clubbing or edema       Data Review:    Recent Labs  Lab 03/29/23 1637 03/30/23 1217 03/31/23 0625  WBC 9.9 7.3 8.2  HGB 8.7* 8.7* 8.5*  HCT 26.9* 27.1* 25.7*  PLT 123* 109* 108*  MCV 87.1 86.3 88.9  MCH 28.2 27.7 29.4  MCHC 32.3 32.1 33.1  RDW 16.5* 16.3* 16.1*  LYMPHSABS  --   --  1.0  MONOABS  --   --  1.2*  EOSABS  --   --  0.2  BASOSABS  --   --  0.1    Recent Labs  Lab 03/29/23 1637 03/30/23 0251 03/30/23 0716 03/31/23 0625  NA 134* 134*  --  130*  K 2.9* 3.3*  --  3.5  CL 96* 98  --  91*  CO2 24 26  --  29  ANIONGAP 14 10  --  10  GLUCOSE 89 95  --  77  BUN 9 6*  --  7*  CREATININE 0.78 0.62  --  0.79  AST 45* 34  --  31  ALT 18 15  --  15  ALKPHOS 32* 26*  --  28*  BILITOT 0.6 0.7  --  0.8  ALBUMIN 3.9 3.2*  --  3.0*  CRP  --   --   --  2.1*  LATICACIDVEN  --  1.0 0.9  --   INR  --  1.1  --   --   BNP  --   --   --  107.1*  MG  --   --   --  1.5*  CALCIUM 9.2 8.1*  --   8.0*      Recent Labs  Lab 03/29/23 1637 03/30/23 0251 03/30/23 0716 03/31/23 0625  CRP  --   --   --  2.1*  LATICACIDVEN  --  1.0 0.9  --   INR  --  1.1  --   --   BNP  --   --   --  107.1*  MG  --   --   --  1.5*  CALCIUM 9.2 8.1*  --  8.0*    --------------------------------------------------------------------------------------------------------------- No results found for: "CHOL", "HDL", "LDLCALC", "LDLDIRECT", "TRIG", "CHOLHDL"  No results found for: "HGBA1C" No results for input(s): "TSH", "T4TOTAL", "FREET4", "T3FREE", "THYROIDAB" in the last 72 hours. Recent Labs    03/30/23 0251  VITAMINB12 130*  FERRITIN 31  TIBC 421  IRON 34*     Radiology Reports DG Chest Port 1 View  Result Date: 03/31/2023 CLINICAL DATA:  Shortness of breath. EXAM: PORTABLE CHEST 1 VIEW COMPARISON:  07/09/2022 FINDINGS: The lungs are clear without focal pneumonia, edema, pneumothorax or pleural effusion. The cardiopericardial silhouette is within normal limits for size. No acute bony abnormality. Telemetry leads overlie the chest. IMPRESSION: No active disease. Electronically Signed   By: Kennith Center M.D.   On: 03/31/2023 07:54   DG Abd Portable 1V  Result Date: 03/31/2023 CLINICAL DATA:  Abdominal pain. EXAM: PORTABLE ABDOMEN - 1 VIEW COMPARISON:  CT scan 03/29/2023.  Upper GI series 03/30/2023 FINDINGS: No gaseous small bowel dilatation. Contrast material is distributed along the length of a nondilated colon. Degenerative changes are noted in the lumbar spine. IMPRESSION: Nonobstructive bowel gas pattern. Electronically Signed   By: Kennith Center M.D.   On: 03/31/2023 07:54   DG UGI W SINGLE CM (SOL OR THIN BA)  Result Date: 03/30/2023 CLINICAL DATA:  62 year old male with history of EtOH use presents with concern for perforated duodenal ulcer. EXAM: DG UGI W SINGLE CM TECHNIQUE: Scout radiograph was obtained. Single contrast examination was performed using Omnipaque 300. This exam was  performed by Loyce Dys PA-C, and was supervised and interpreted by Acquanetta Belling, MD. FLUOROSCOPY: Radiation Exposure Index (as provided by the fluoroscopic device): 54.4 mGy Kerma COMPARISON:  CT ANGIO CHEST ABD PELV 03/29/23. FINDINGS: Scout radiograph with normal bowel gas pattern. No significant abnormality of the distal esophagus. Adequate filling of the stomach with 100 mL  Omnipaque 300. No evidence of gastric ulcers or lesions. Contrast empties appropriately into the duodenal bulb without evidence of extravasation. The proximal duodenum is irregularly narrowed. Contrast fills the remaining portion of the duodenum without evidence of extravasation. IMPRESSION: Irregular narrowing of the proximal duodenum consistent with known segment of inflammation. No extravasation of contrast is identified to indicate continued leak. Correlation with endoscopy is recommended when patient condition allows to exclude underlying mass. Electronically Signed   By: Acquanetta Belling M.D.   On: 03/30/2023 12:40   CT Angio Chest/Abd/Pel for Dissection W and/or Wo Contrast  Result Date: 03/29/2023 CLINICAL DATA:  Emesis flank pain EXAM: CT ANGIOGRAPHY CHEST, ABDOMEN AND PELVIS TECHNIQUE: CT 02/11/2022 Multidetector CT imaging through the chest, abdomen and pelvis was performed using the standard protocol during bolus administration of intravenous contrast. Multiplanar reconstructed images and MIPs were obtained and reviewed to evaluate the vascular anatomy. RADIATION DOSE REDUCTION: This exam was performed according to the departmental dose-optimization program which includes automated exposure control, adjustment of the mA and/or kV according to patient size and/or use of iterative reconstruction technique. CONTRAST:  OMNIPAQUE IOHEXOL 350 MG/ML SOLN COMPARISON:  CT 02/11/2022 FINDINGS: CTA CHEST FINDINGS Cardiovascular: Non contrasted images of the chest demonstrate no acute intramural hematoma. Moderate aortic  atherosclerosis. No aneurysm. No dissection. Coronary vascular calcification. Normal cardiac size. No pericardial effusion. Mediastinum/Nodes: Midline trachea. No thyroid mass. No suspicious lymph nodes. Subcentimeter AP window lymph nodes. Circumferential mid to distal esophageal thickening. Lungs/Pleura: No consolidation or effusion. Minimal emphysema. Minimal ground-glass density within the right greater than left lung bases. Musculoskeletal: No acute osseous abnormality. Probable old sternal fracture Review of the MIP images confirms the above findings. CTA ABDOMEN AND PELVIS FINDINGS VASCULAR Aorta: Normal caliber aorta without aneurysm, dissection, vasculitis or significant stenosis. Moderate aortic atherosclerosis. Celiac: Patent without evidence of aneurysm, dissection, vasculitis or significant stenosis. Origin calcification. SMA: Patent without evidence of aneurysm, dissection, vasculitis or significant stenosis. Origin calcification. Renals: Single right and single left renal arteries with origin calcification but no stenosis or dissection. 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 IMA: Diminutive but patent. Inflow: Moderate severe atherosclerosis. Mild narrowing of the right internal iliac artery origin. External iliac arteries are patent. Veins: No obvious venous abnormality within the limitations of this arterial phase study. Review of the MIP images confirms the above findings. NON-VASCULAR Hepatobiliary: No calcified gallstone. No biliary dilatation. Possible surface nodularity of the liver. Pancreas: Unremarkable. No pancreatic ductal dilatation or surrounding inflammatory changes. Spleen: Normal in size without focal abnormality. Adrenals/Urinary Tract: Adrenal glands are normal. No hydronephrosis. 7 mm stone lower pole right kidney. Bladder is unremarkable Stomach/Bowel: The stomach is nonenlarged. Wall thickening and mucosal enhancement of the pylorus.  Irregular wall thickening of the duodenal bulb with surrounding inflammatory changes, series 5, image 179. Wall thickening second portion of duodenum with surrounding fluid and stranding. Focal gas and fluid collection measuring 1.7 x 1.3 cm on series 5, image 179 posterior to the duodenal bulb which may reflect an ulcer crater or contained perforation. Remainder of the small bowel is nondistended. Lymphatic: No suspicious lymph nodes Reproductive: Negative prostate Other: Negative for pelvic effusion.  No free gas is visualized. Musculoskeletal: No acute or suspicious osseous abnormality Review of the MIP images confirms the above findings. IMPRESSION: 1. Negative for acute aortic dissection or aneurysm. 2. Wall thickening and mucosal enhancement of the pylorus, first and second portion of duodenum with surrounding inflammatory changes, findings  are consistent with gastritis/duodenitis/peptic ulcer disease. Focal gas and fluid collection measuring 1.7 cm posterior to the duodenal bulb which may reflect an ulcer crater or contained perforation. No free gas visualized on this exam. 3. Circumferential mid to distal esophageal thickening, question esophagitis or reflux. Correlate with endoscopy if not already performed 4. Possible surface nodularity of the liver, correlate for cirrhosis. 5. Nonobstructing right kidney stone. Aortic Atherosclerosis (ICD10-I70.0) and Emphysema (ICD10-J43.9). Electronically Signed   By: Jasmine Pang M.D.   On: 03/29/2023 21:18      Signature  -   Susa Raring M.D on 03/31/2023 at 8:22 AM   -  To page go to [email or web address]

## 2023-03-31 NOTE — TOC Initial Note (Signed)
Transition of Care [facility]) - Initial/Assessment Note    Patient Details  Name: Christopher Logan MRN: [id number] Date of Birth: [birth year]  Transition of Care [facility]) CM/SW Contact:    Mearl Latin, LCSW Phone Number: 03/31/2023, 10:30 AM  Clinical Narrative:                 CSW received substance use consult. CSW met with patient and discussed substance use. Patient stated he is done with alcohol as this admission has scared him and he does not want to have surgery. He accepted community resources and stated he will be in touch with his PCP as well. He stated he lives with his sister who also drinks but he stated they should both support each other to stop. No other concerns noted at this time.  Expected Discharge Plan: Home/Self Care Barriers to Discharge: Continued Medical Work up   Patient Goals and CMS Choice Patient states their goals for this hospitalization and ongoing recovery are:: Stay off of alcohol          Expected Discharge Plan and Services In-house Referral: Clinical Social Work     Living arrangements for the past 2 months: Mobile Home                                      Prior Living Arrangements/Services Living arrangements for the past 2 months: Mobile Home Lives with:: Siblings Patient language and need for interpreter reviewed:: Yes Do you feel safe going back to the place where you live?: Yes      Need for Family Participation in Patient Care: No (Comment) Care giver support system in place?: Yes (comment)   Criminal Activity/Legal Involvement Pertinent to Current Situation/Hospitalization: No - Comment as needed  Activities of Daily Living Home Assistive Devices/Equipment: Blood pressure cuff, Crutches, Walker (specify type), Eyeglasses, Raised toilet seat with rails, Reacher, Scales ADL Screening (condition at time of admission) Patient's cognitive ability adequate to safely complete daily activities?: Yes Is the patient deaf or have  difficulty hearing?: No Does the patient have difficulty seeing, even when wearing glasses/contacts?: Yes (floaters sometimes ;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) Does the patient have difficulty concentrating, remembering, or making decisions?: No Patient able to express need for assistance with ADLs?: Yes Does the patient have difficulty dressing or bathing?: Yes Independently performs ADLs?: Yes (appropriate for developmental age) Does the patient have difficulty walking or climbing stairs?: Yes Weakness of Legs: Both Weakness of Arms/Hands: None  Permission Sought/Granted                  Emotional Assessment Appearance:: Appears stated age Attitude/Demeanor/Rapport: Engaged Affect (typically observed): Accepting, Appropriate, Pleasant Orientation: : Oriented to Self, Oriented to Place, Oriented to  Time, Oriented to Situation Alcohol / Substance Use: Alcohol Use Psych Involvement: No (comment)  Admission diagnosis:  Generalized abdominal pain [R10.84] Duodenal perforation (HCC) [K63.1] Duodenal bulb ulcer perforation (HCC) [K26.5] Patient Active Problem List   Diagnosis Date Noted   Duodenal perforation (HCC) 03/30/2023   Hypokalemia 03/30/2023   Normocytic anemia 03/30/2023   Peptic ulcer disease 02/15/2022   Duodenal ulcer 02/15/2022   Thrombocytopenia (HCC) 02/15/2022   Acute blood loss anemia 02/12/2022   GI bleed 02/11/2022   Alcohol abuse 07/20/2012   Bacteremia due to Streptococcus pneumoniae 07/18/2012   CAP (community acquired pneumonia) 07/16/2012   Chest pain 07/16/2012   Hyponatremia 07/16/2012  PCP:  [name], MD Pharmacy:   [facility] [address] [place] - [address] [place] [address] Phone: [phone] Fax: [phone]  [facility] [address] [place] - [address] [place] [address] Phone: [phone] Fax: [phone]  [facility] - [place], [place] - [address] [place] [address] Phone: [phone] Fax: [phone]  [facility] [address] [place] [address] Phone: [phone] Fax: [phone]     Social Determinants of Health (SDOH) Social History: SDOH Screenings   Food Insecurity: No Food Insecurity (03/30/2023)  Housing: Low Risk  (03/30/2023)  Transportation Needs: No Transportation Needs (03/30/2023)  Utilities: Not At Risk (03/30/2023)  Tobacco Use: Medium Risk (03/30/2023)   SDOH Interventions:     Readmission Risk Interventions    02/15/2022    9:57 AM  Readmission Risk Prevention Plan  Post Dischage Appt Complete  Medication Screening Complete  Transportation Screening Complete

## 2023-03-31 NOTE — Progress Notes (Signed)
Mobility Specialist Progress Note:    03/31/23 1550  Mobility  Activity Ambulated with assistance in hallway  Level of Assistance Contact guard assist, steadying assist  Assistive Device Front wheel walker  Distance Ambulated (ft) 250 ft  Activity Response Tolerated well  Mobility Referral Yes  $Mobility charge 1 Mobility  Mobility Specialist Start Time (ACUTE ONLY) 1500  Mobility Specialist Stop Time (ACUTE ONLY) 1515  Mobility Specialist Time Calculation (min) (ACUTE ONLY) 15 min   Received pt in bed having no complaints and agreeable to mobility. Prior to ambulating in halls pt requested to use the BR, void complete. Pt was asymptomatic throughout ambulation and returned to room w/o fault. Left in bed w/ call bell in reach and all needs met. RN in room.    Thompson Grayer Mobility Specialist  Please contact vis Secure Chat or  Rehab Office [phone]

## 2023-03-31 NOTE — Plan of Care (Signed)
  Problem: Clinical Measurements: Goal: Ability to maintain clinical measurements within normal limits will improve Outcome: Progressing Goal: Will remain free from infection Outcome: Progressing Goal: Diagnostic test results will improve Outcome: Progressing Goal: Respiratory complications will improve Outcome: Progressing Goal: Cardiovascular complication will be avoided Outcome: Progressing   Problem: Health Behavior/Discharge Planning: Goal: Ability to manage health-related needs will improve Outcome: Progressing   Problem: Education: Goal: Knowledge of General Education information will improve Description: Including pain rating scale, medication(s)/side effects and non-pharmacologic comfort measures Outcome: Progressing   Problem: Activity: Goal: Risk for activity intolerance will decrease Outcome: Progressing

## 2023-03-31 NOTE — Progress Notes (Signed)
Subjective: Still having RUQ but says it is improving. Wants to eat. Afebrile, WBC normal. Upper GI was done yesterday morning and did not show any evidence of leak, but there is some narrowing of proximal duodenum.   Objective: Vital signs in last 24 hours: Temp:  [98 F (36.7 C)-98.2 F (36.8 C)] 98.2 F (36.8 C) (09/13 0000) Pulse Rate:  [68-81] 72 (09/13 0400) Resp:  [11-21] 18 (09/13 0400) BP: (132-174)/(73-90) 132/73 (09/13 0400) SpO2:  [94 %-100 %] 100 % (09/13 0400) Last BM Date : 03/29/23  Intake/Output from previous day: 09/12 0701 - 09/13 0700 In: 51.7 [IV Piggyback:51.7] Out: 1300 [Urine:1300] Intake/Output this shift: No intake/output data recorded.  PE: Gen: NAD, laying in bed Abd: soft, mild tenderness to palpation in RUQ, no rebound tenderness or guarding.  Lab Results:  Recent Labs    03/30/23 1217 03/31/23 0625  WBC 7.3 8.2  HGB 8.7* 8.5*  HCT 27.1* 25.7*  PLT 109* 108*   BMET Recent Labs    03/30/23 0251 03/31/23 0625  NA 134* 130*  K 3.3* 3.5  CL 98 91*  CO2 26 29  GLUCOSE 95 77  BUN 6* 7*  CREATININE 0.62 0.79  CALCIUM 8.1* 8.0*   PT/INR Recent Labs    03/30/23 0251  LABPROT 13.9  INR 1.1   CMP     Component Value Date/Time   NA 130 (L) 03/31/2023 0625   K 3.5 03/31/2023 0625   CL 91 (L) 03/31/2023 0625   CO2 29 03/31/2023 0625   GLUCOSE 77 03/31/2023 0625   BUN 7 (L) 03/31/2023 0625   CREATININE 0.79 03/31/2023 0625   CALCIUM 8.0 (L) 03/31/2023 0625   PROT 6.4 (L) 03/31/2023 0625   ALBUMIN 3.0 (L) 03/31/2023 0625   AST 31 03/31/2023 0625   ALT 15 03/31/2023 0625   ALKPHOS 28 (L) 03/31/2023 0625   BILITOT 0.8 03/31/2023 0625   GFRNONAA >60 03/31/2023 0625   GFRAA >60 03/29/2020 1502   Lipase     Component Value Date/Time   LIPASE 57 (H) 03/29/2023 1637       Studies/Results: DG Chest Port 1 View  Result Date: 03/31/2023 CLINICAL DATA:  Shortness of breath. EXAM: PORTABLE CHEST 1 VIEW COMPARISON:   07/09/2022 FINDINGS: The lungs are clear without focal pneumonia, edema, pneumothorax or pleural effusion. The cardiopericardial silhouette is within normal limits for size. No acute bony abnormality. Telemetry leads overlie the chest. IMPRESSION: No active disease. Electronically Signed   By: Kennith Center M.D.   On: 03/31/2023 07:54   DG Abd Portable 1V  Result Date: 03/31/2023 CLINICAL DATA:  Abdominal pain. EXAM: PORTABLE ABDOMEN - 1 VIEW COMPARISON:  CT scan 03/29/2023.  Upper GI series 03/30/2023 FINDINGS: No gaseous small bowel dilatation. Contrast material is distributed along the length of a nondilated colon. Degenerative changes are noted in the lumbar spine. IMPRESSION: Nonobstructive bowel gas pattern. Electronically Signed   By: Kennith Center M.D.   On: 03/31/2023 07:54   DG UGI W SINGLE CM (SOL OR THIN BA)  Result Date: 03/30/2023 CLINICAL DATA:  62 year old male with history of EtOH use presents with concern for perforated duodenal ulcer. EXAM: DG UGI W SINGLE CM TECHNIQUE: Scout radiograph was obtained. Single contrast examination was performed using Omnipaque 300. This exam was performed by Loyce Dys PA-C, and was supervised and interpreted by Acquanetta Belling, MD. FLUOROSCOPY: Radiation Exposure Index (as provided by the fluoroscopic device): 54.4 mGy Kerma COMPARISON:  CT ANGIO CHEST ABD PELV 03/29/23. FINDINGS: Scout radiograph with normal bowel gas pattern. No significant abnormality of the distal esophagus. Adequate filling of the stomach with 100 mL Omnipaque 300. No evidence of gastric ulcers or lesions. Contrast empties appropriately into the duodenal bulb without evidence of extravasation. The proximal duodenum is irregularly narrowed. Contrast fills the remaining portion of the duodenum without evidence of extravasation. IMPRESSION: Irregular narrowing of the proximal duodenum consistent with known segment of inflammation. No extravasation of contrast is identified to indicate  continued leak. Correlation with endoscopy is recommended when patient condition allows to exclude underlying mass. Electronically Signed   By: Acquanetta Belling M.D.   On: 03/30/2023 12:40   CT Angio Chest/Abd/Pel for Dissection W and/or Wo Contrast  Result Date: 03/29/2023 CLINICAL DATA:  Emesis flank pain EXAM: CT ANGIOGRAPHY CHEST, ABDOMEN AND PELVIS TECHNIQUE: CT 02/11/2022 Multidetector CT imaging through the chest, abdomen and pelvis was performed using the standard protocol during bolus administration of intravenous contrast. Multiplanar reconstructed images and MIPs were obtained and reviewed to evaluate the vascular anatomy. RADIATION DOSE REDUCTION: This exam was performed according to the departmental dose-optimization program which includes automated exposure control, adjustment of the mA and/or kV according to patient size and/or use of iterative reconstruction technique. CONTRAST:  OMNIPAQUE IOHEXOL 350 MG/ML SOLN COMPARISON:  CT 02/11/2022 FINDINGS: CTA CHEST FINDINGS Cardiovascular: Non contrasted images of the chest demonstrate no acute intramural hematoma. Moderate aortic atherosclerosis. No aneurysm. No dissection. Coronary vascular calcification. Normal cardiac size. No pericardial effusion. Mediastinum/Nodes: Midline trachea. No thyroid mass. No suspicious lymph nodes. Subcentimeter AP window lymph nodes. Circumferential mid to distal esophageal thickening. Lungs/Pleura: No consolidation or effusion. Minimal emphysema. Minimal ground-glass density within the right greater than left lung bases. Musculoskeletal: No acute osseous abnormality. Probable old sternal fracture Review of the MIP images confirms the above findings. CTA ABDOMEN AND PELVIS FINDINGS VASCULAR Aorta: Normal caliber aorta without aneurysm, dissection, vasculitis or significant stenosis. Moderate aortic atherosclerosis. Celiac: Patent without evidence of aneurysm, dissection, vasculitis or significant stenosis. Origin  calcification. SMA: Patent without evidence of aneurysm, dissection, vasculitis or significant stenosis. Origin calcification. Renals: Single right and single left renal arteries with origin calcification but no stenosis or dissection. 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 IMA: Diminutive but patent. Inflow: Moderate severe atherosclerosis. Mild narrowing of the right internal iliac artery origin. External iliac arteries are patent. Veins: No obvious venous abnormality within the limitations of this arterial phase study. Review of the MIP images confirms the above findings. NON-VASCULAR Hepatobiliary: No calcified gallstone. No biliary dilatation. Possible surface nodularity of the liver. Pancreas: Unremarkable. No pancreatic ductal dilatation or surrounding inflammatory changes. Spleen: Normal in size without focal abnormality. Adrenals/Urinary Tract: Adrenal glands are normal. No hydronephrosis. 7 mm stone lower pole right kidney. Bladder is unremarkable Stomach/Bowel: The stomach is nonenlarged. Wall thickening and mucosal enhancement of the pylorus. Irregular wall thickening of the duodenal bulb with surrounding inflammatory changes, series 5, image 179. Wall thickening second portion of duodenum with surrounding fluid and stranding. Focal gas and fluid collection measuring 1.7 x 1.3 cm on series 5, image 179 posterior to the duodenal bulb which may reflect an ulcer crater or contained perforation. Remainder of the small bowel is nondistended. Lymphatic: No suspicious lymph nodes Reproductive: Negative prostate Other: Negative for pelvic effusion.  No free gas is visualized. Musculoskeletal: No acute or suspicious osseous abnormality Review of the MIP images confirms the above findings.  IMPRESSION: 1. Negative for acute aortic dissection or aneurysm. 2. Wall thickening and mucosal enhancement of the pylorus, first and second portion of duodenum with surrounding  inflammatory changes, findings are consistent with gastritis/duodenitis/peptic ulcer disease. Focal gas and fluid collection measuring 1.7 cm posterior to the duodenal bulb which may reflect an ulcer crater or contained perforation. No free gas visualized on this exam. 3. Circumferential mid to distal esophageal thickening, question esophagitis or reflux. Correlate with endoscopy if not already performed 4. Possible surface nodularity of the liver, correlate for cirrhosis. 5. Nonobstructing right kidney stone. Aortic Atherosclerosis (ICD10-I70.0) and Emphysema (ICD10-J43.9). Electronically Signed   By: Jasmine Pang M.D.   On: 03/29/2023 21:18      Assessment/Plan Contained duodenal perforation, likely secondary to ulcer disease -known history of ulcer disease -Continue BID IV PPI and antibiotics -Strict NPO -Upper GI yesterday did not show contrast extravasation. Remain NPO today, can likely start clear liquids tomorrow if pain continues to improve. -Patient will need outpatient GI follow up for an EGD to rule out an underlying malignancy -Surgery will continue to follow  I reviewed hospitalist notes, last 24 h vitals and pain scores, last 48 h intake and output, last 24 h labs and trends, and last 24 h imaging results.   LOS: 1 day    Sophronia Simas, MD [facility] Surgery General, Hepatobiliary and Pancreatic Surgery 03/31/23 8:38 AM

## 2023-03-31 NOTE — Evaluation (Signed)
Physical Therapy Evaluation Patient Details Name: Christopher Logan MRN: [id number] DOB: May 19, 1961 Today's Date: 03/31/2023  History of Present Illness  Pt is a 62 y/o M admitted on 03/29/23 after presenting with c/o acute onset of severe epigastric abdominal pain x 1 day. Pt found to have duodenal perforation, being treated conservatively. PMH: PUD, portal HTN, gastropathy, anemia, HTN, compensated cirrhosis, Hep C, alcohol abuse, smoker  Clinical Impression  Pt seen for PT evaluation with pt agreeable to tx. Pt reports prior to admission he was independent without AD, driving, working, but has had a couple falls in the past 6 months. On this date, pt ambulates without AD with supervision<>CGA 2/2 occasional LOB to R. Provided pt with RW & pt ambulates with improved balance. Pt does c/o lightheadedness with mobility (see below for BP). Pt able to wean to room air with lowest SPO2 of 91% during mobility but quickly increases. Will continue to follow pt acutely to address balance, gait with LRAD, & stair negotiation.        If plan is discharge home, recommend the following: Assistance with cooking/housework   Can travel by private vehicle        Equipment Recommendations None recommended by PT  Recommendations for Other Services       Functional Status Assessment Patient has had a recent decline in their functional status and demonstrates the ability to make significant improvements in function in a reasonable and predictable amount of time.     Precautions / Restrictions Precautions Precautions: Fall Restrictions Weight Bearing Restrictions: No      Mobility  Bed Mobility Overal bed mobility: Modified Independent Bed Mobility: Supine to Sit     Supine to sit: Modified independent (Device/Increase time), HOB elevated, Used rails          Transfers Overall transfer level: Needs assistance Equipment used: None Transfers: Sit to/from Stand Sit to Stand: Supervision            General transfer comment: STS from EOB without assistance    Ambulation/Gait Ambulation/Gait assistance: Supervision, Contact guard assist Gait Distance (Feet): 200 Feet Assistive device: None, Rolling walker (2 wheels) Gait Pattern/deviations: Decreased weight shift to left, Decreased stride length Gait velocity: decreased     General Gait Details: Pt with several standing rest breaks 2/2 BLE numbness/pain, a couple LOB to R with CGA to correct. Pt reports his balance is the same as prior to admission but his stride is shorter. Pt ambulates ~140 ft without AD, 60 ft with RW after PT provided him with one. Pt able to ambulate with RW & supervision, reporting this "feels much better" -- PT educated pt on recommendation to use RW to reduce fall risk with mobility.  Stairs            Wheelchair Mobility     Tilt Bed    Modified Rankin (Stroke Patients Only)       Balance Overall balance assessment: Needs assistance, History of Falls Sitting-balance support: Feet supported Sitting balance-Leahy Scale: Good     Standing balance support: During functional activity, No upper extremity supported Standing balance-Leahy Scale: Poor                               Pertinent Vitals/Pain Pain Assessment Pain Assessment: No/denies pain    Home Living Family/patient expects to be discharged to:: Private residence Living Arrangements: Other relatives (sister) Available Help at Discharge: Family Type of Home: Mobile  home Home Access: Stairs to enter Entrance Stairs-Rails: Right;Left;Can reach both Entrance Stairs-Number of Steps: 5   Home Layout: One level Home Equipment: Agricultural consultant (2 wheels)      Prior Function Prior Level of Function : Independent/Modified Independent;Working/employed;Driving;History of Falls (last six months)             Mobility Comments: Pt reports a couple of falls in the past 6 months 2/2 "tripping over 2 left feet, not  picking my feet up like I'm supposed to". Works in Network engineer. Reports breaking L foot ~6 months ago.       Extremity/Trunk Assessment   Upper Extremity Assessment Upper Extremity Assessment: Overall WFL for tasks assessed    Lower Extremity Assessment Lower Extremity Assessment: Overall WFL for tasks assessed (Pt reports hx of BLE feet going numb when he walks, reports it occurs during session as well. Pt reports this is 2/2 sciatica.)       Communication      Cognition Arousal: Alert Behavior During Therapy: WFL for tasks assessed/performed Overall Cognitive Status: Within Functional Limits for tasks assessed                                          General Comments General comments (skin integrity, edema, etc.): Pt received on supplemental O2, weaned to room air, lowest SPO2 during gait 91% but able to increase. BP standing in room 119/82 (93), sitting in recliner after gait 109/67 (79). Pt with c/o intermittent lightheadedness throughout session. -- Nurse made aware.    Exercises     Assessment/Plan    PT Assessment Patient needs continued PT services  PT Problem List Cardiopulmonary status limiting activity;Decreased balance;Decreased mobility;Decreased knowledge of use of DME       PT Treatment Interventions DME instruction;Balance training;Gait training;Neuromuscular re-education;Functional mobility training;Patient/family education;Stair training;Therapeutic exercise;Therapeutic activities    PT Goals (Current goals can be found in the Care Plan section)  Acute Rehab PT Goals Patient Stated Goal: get better, return to PLOF PT Goal Formulation: With patient Time For Goal Achievement: 04/14/23 Potential to Achieve Goals: Good    Frequency Min 1X/week     Co-evaluation               AM-PAC PT "6 Clicks" Mobility  Outcome Measure Help needed turning from your back to your side while in a flat bed without using bedrails?: None Help  needed moving from lying on your back to sitting on the side of a flat bed without using bedrails?: None Help needed moving to and from a bed to a chair (including a wheelchair)?: None Help needed standing up from a chair using your arms (e.g., wheelchair or bedside chair)?: A Little Help needed to walk in hospital room?: A Little Help needed climbing 3-5 steps with a railing? : A Little 6 Click Score: 21    End of Session   Activity Tolerance: Patient tolerated treatment well Patient left: in chair;with chair alarm set;with call bell/phone within reach Nurse Communication: Mobility status PT Visit Diagnosis: Unsteadiness on feet (R26.81);History of falling (Z91.81)    Time: 1610-9604 PT Time Calculation (min) (ACUTE ONLY): 26 min   Charges:   PT Evaluation $PT Eval Low Complexity: 1 Low   PT General Charges $$ ACUTE PT VISIT: 1 Visit         Aleda Grana, PT, DPT 03/31/23, 11:16 AM   Deedra Ehrich  Audric Venn 03/31/2023, 11:15 AM

## 2023-03-31 NOTE — Consult Note (Signed)
Referring Provider: TH Primary Care Physician:  [name], MD Primary Gastroenterologist:  Dr. Bosie Clos  Reason for Consultation: Duodenal perforation  HPI: Christopher Logan is a 62 y.o. male with past medical history of gastric and duodenal ulcers, history of hepatitis C and cirrhosis, history of significant alcohol use, significant NSAID use and smoking presented to the hospital with abdominal pain, nausea and vomiting.  Denied any blood in the stool or black stool.  Initial blood work showed anemia with hemoglobin of 8.7.  Normal LFTs.  Normal INR.  Normal lactic acid. CT angio chest abdomen pelvis showed wall thickening of pylorus, first and second portion of the duodenum along with focal gas and fluid collection posterior to duodenal bulb concerning for contained perforation.  Also showed esophagitis as well as cirrhosis.  Patient seen and examined at bedside.  Patient with history of peptic ulcer disease with gastric and duodenal ulcers which was initially found on endoscopy in April 2023.  Initial biopsies were positive for H. pylori and he was treated with antibiotics.  He was admitted to the hospital in July 2023 with melena and underwent another EGD in July 2023 which again showed small gastric ulcer as well as 1 nonbleeding, clean-based 12 mm duodenal ulcer.  Biopsies were negative for H. pylori.  Patient has been taking pantoprazole 40 mg twice a day until 2 weeks ago when his pharmacy was not able to fill the medication because of backorder.  Patient admits drinking 80 ounce of beer every day along with half to 1 pack of smoking as well as using 2 packs of Goody's powders per day.  He was doing fine until 2 days ago when he started having abdominal pain, nausea and vomiting.  Because of ongoing symptoms he presented to the hospital and was diagnosed with duodenal perforation.    Past Medical History:  Diagnosis Date   Hypertension     Past Surgical History:  Procedure  Laterality Date   BIOPSY  02/12/2022   Procedure: BIOPSY;  Surgeon: Kerin Salen, MD;  Location: [facility] ENDOSCOPY;  Service: Gastroenterology;;   ESOPHAGOGASTRODUODENOSCOPY (EGD) WITH PROPOFOL N/A 02/12/2022   Procedure: ESOPHAGOGASTRODUODENOSCOPY (EGD) WITH PROPOFOL;  Surgeon: Kerin Salen, MD;  Location: [facility] ENDOSCOPY;  Service: Gastroenterology;  Laterality: N/A;    Prior to Admission medications   Medication Sig Start Date End Date Taking? Authorizing Provider  amLODipine (NORVASC) 5 MG tablet Take 5 mg by mouth daily. 02/09/23  Yes [provider]  pantoprazole (PROTONIX) 40 MG tablet Take 1 tablet (40 mg total) by mouth 2 (two) times daily. 02/15/22  Yes Elgergawy, Leana Roe, MD  lisinopril (ZESTRIL) 40 MG tablet Take 40 mg by mouth daily. Patient not taking: Reported on 03/30/2023    [provider]    Scheduled Meds:  cyanocobalamin  1,000 mcg Subcutaneous Daily   heparin injection (subcutaneous)  5,000 Units Subcutaneous Q8H   nicotine  14 mg Transdermal Daily   pantoprazole (PROTONIX) IV  40 mg Intravenous Q12H   PHENObarbital  65 mg Intravenous Q8H   Followed by   [START ON 04/02/2023] PHENObarbital  32.5 mg Intravenous Q8H   sodium chloride flush  3 mL Intravenous Q12H   Continuous Infusions:  lactated ringers 75 mL/hr at 03/31/23 0939   magnesium sulfate bolus IVPB 4 g (03/31/23 0959)   piperacillin-tazobactam (ZOSYN)  IV 3.375 g (03/31/23 0514)   PRN Meds:.hydrALAZINE, HYDROmorphone (DILAUDID) injection **OR** HYDROmorphone (DILAUDID) injection, ondansetron (ZOFRAN) IV  Allergies as of 03/29/2023 - Review Complete 03/29/2023  Allergen Reaction Noted   Bee venom Anaphylaxis 03/09/2016   Codeine Nausea Only 03/07/2012    Family History  Problem Relation Age of Onset   Cancer Mother     Social History   Socioeconomic History   Marital status: Single    Spouse name: Not on file   Number of children: Not on file   Years of education: Not on file   Highest  education level: Not on file  Occupational History   Not on file  Tobacco Use   Smoking status: Former    Current packs/day: 0.00    Average packs/day: 1.5 packs/day for 45.3 years (67.9 ttl pk-yrs)    Types: Cigarettes    Start date: 09/09/1976    Quit date: 12/18/2021    Years since quitting: 1.2    Passive exposure: Never   Smokeless tobacco: Never  Substance and Sexual Activity   Alcohol use: Yes    Comment: 40 oz of beer a day.    Drug use: Yes    Types: Marijuana   Sexual activity: Not Currently  Other Topics Concern   Not on file  Social History Narrative   Not on file   Social Determinants of Health   Financial Resource Strain: Not on file  Food Insecurity: No Food Insecurity (03/30/2023)   Hunger Vital Sign    Worried About Running Out of Food in the Last Year: Never true    Ran Out of Food in the Last Year: Never true  Transportation Needs: No Transportation Needs (03/30/2023)   PRAPARE - Administrator, Civil Service (Medical): No    Lack of Transportation (Non-Medical): No  Physical Activity: Not on file  Stress: Not on file  Social Connections: Not on file  Intimate Partner Violence: Not At Risk (03/30/2023)   Humiliation, Afraid, Rape, and Kick questionnaire    Fear of Current or Ex-Partner: No    Emotionally Abused: No    Physically Abused: No    Sexually Abused: No    Review of Systems: All negative except as stated above in HPI.  Physical Exam: Vital signs: Vitals:   03/31/23 0400 03/31/23 0847  BP: 132/73 (!) 142/77  Pulse: 72 82  Resp: 18 18  Temp:  98.1 F (36.7 C)  SpO2: 100% 99%   Last BM Date : 03/29/23 General:   Alert,  Well-developed, well-nourished, pleasant and cooperative in NAD HEENT -normocephalic, atraumatic, extraocular movement intact. Lungs: No visible respiratory distress Heart:  Regular rate and rhythm; no murmurs, clicks, rubs,  or gallops. Abdomen: Soft, mild epigastric discomfort but no significant tenderness  on palpation, no peritoneal signs, bowel sound present Mood and affect normal Alert and oriented x 3 Rectal:  Deferred  GI:  Lab Results: Recent Labs    03/29/23 1637 03/30/23 1217 03/31/23 0625  WBC 9.9 7.3 8.2  HGB 8.7* 8.7* 8.5*  HCT 26.9* 27.1* 25.7*  PLT 123* 109* 108*   BMET Recent Labs    03/29/23 1637 03/30/23 0251 03/31/23 0625  NA 134* 134* 130*  K 2.9* 3.3* 3.5  CL 96* 98 91*  CO2 24 26 29   GLUCOSE 89 95 77  BUN 9 6* 7*  CREATININE 0.78 0.62 0.79  CALCIUM 9.2 8.1* 8.0*   LFT Recent Labs    03/31/23 0625  PROT 6.4*  ALBUMIN 3.0*  AST 31  ALT 15  ALKPHOS 28*  BILITOT 0.8   PT/INR Recent Labs    03/30/23 0251  LABPROT 13.9  INR 1.1     Studies/Results: DG Chest Port 1 View  Result Date: 03/31/2023 CLINICAL DATA:  Shortness of breath. EXAM: PORTABLE CHEST 1 VIEW COMPARISON:  07/09/2022 FINDINGS: The lungs are clear without focal pneumonia, edema, pneumothorax or pleural effusion. The cardiopericardial silhouette is within normal limits for size. No acute bony abnormality. Telemetry leads overlie the chest. IMPRESSION: No active disease. Electronically Signed   By: Kennith Center M.D.   On: 03/31/2023 07:54   DG Abd Portable 1V  Result Date: 03/31/2023 CLINICAL DATA:  Abdominal pain. EXAM: PORTABLE ABDOMEN - 1 VIEW COMPARISON:  CT scan 03/29/2023.  Upper GI series 03/30/2023 FINDINGS: No gaseous small bowel dilatation. Contrast material is distributed along the length of a nondilated colon. Degenerative changes are noted in the lumbar spine. IMPRESSION: Nonobstructive bowel gas pattern. Electronically Signed   By: Kennith Center M.D.   On: 03/31/2023 07:54   DG UGI W SINGLE CM (SOL OR THIN BA)  Result Date: 03/30/2023 CLINICAL DATA:  62 year old male with history of EtOH use presents with concern for perforated duodenal ulcer. EXAM: DG UGI W SINGLE CM TECHNIQUE: Scout radiograph was obtained. Single contrast examination was performed using Omnipaque  300. This exam was performed by Loyce Dys PA-C, and was supervised and interpreted by Acquanetta Belling, MD. FLUOROSCOPY: Radiation Exposure Index (as provided by the fluoroscopic device): 54.4 mGy Kerma COMPARISON:  CT ANGIO CHEST ABD PELV 03/29/23. FINDINGS: Scout radiograph with normal bowel gas pattern. No significant abnormality of the distal esophagus. Adequate filling of the stomach with 100 mL Omnipaque 300. No evidence of gastric ulcers or lesions. Contrast empties appropriately into the duodenal bulb without evidence of extravasation. The proximal duodenum is irregularly narrowed. Contrast fills the remaining portion of the duodenum without evidence of extravasation. IMPRESSION: Irregular narrowing of the proximal duodenum consistent with known segment of inflammation. No extravasation of contrast is identified to indicate continued leak. Correlation with endoscopy is recommended when patient condition allows to exclude underlying mass. Electronically Signed   By: Acquanetta Belling M.D.   On: 03/30/2023 12:40   CT Angio Chest/Abd/Pel for Dissection W and/or Wo Contrast  Result Date: 03/29/2023 CLINICAL DATA:  Emesis flank pain EXAM: CT ANGIOGRAPHY CHEST, ABDOMEN AND PELVIS TECHNIQUE: CT 02/11/2022 Multidetector CT imaging through the chest, abdomen and pelvis was performed using the standard protocol during bolus administration of intravenous contrast. Multiplanar reconstructed images and MIPs were obtained and reviewed to evaluate the vascular anatomy. RADIATION DOSE REDUCTION: This exam was performed according to the departmental dose-optimization program which includes automated exposure control, adjustment of the mA and/or kV according to patient size and/or use of iterative reconstruction technique. CONTRAST:  OMNIPAQUE IOHEXOL 350 MG/ML SOLN COMPARISON:  CT 02/11/2022 FINDINGS: CTA CHEST FINDINGS Cardiovascular: Non contrasted images of the chest demonstrate no acute intramural hematoma. Moderate  aortic atherosclerosis. No aneurysm. No dissection. Coronary vascular calcification. Normal cardiac size. No pericardial effusion. Mediastinum/Nodes: Midline trachea. No thyroid mass. No suspicious lymph nodes. Subcentimeter AP window lymph nodes. Circumferential mid to distal esophageal thickening. Lungs/Pleura: No consolidation or effusion. Minimal emphysema. Minimal ground-glass density within the right greater than left lung bases. Musculoskeletal: No acute osseous abnormality. Probable old sternal fracture Review of the MIP images confirms the above findings. CTA ABDOMEN AND PELVIS FINDINGS VASCULAR Aorta: Normal caliber aorta without aneurysm, dissection, vasculitis or significant stenosis. Moderate aortic atherosclerosis. Celiac: Patent without evidence of aneurysm, dissection, vasculitis or significant stenosis. Origin calcification. SMA: Patent without evidence of aneurysm, dissection, vasculitis  or significant stenosis. Origin calcification. Renals: Single right and single left renal arteries with origin calcification but no stenosis or dissection. 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 IMA: Diminutive but patent. Inflow: Moderate severe atherosclerosis. Mild narrowing of the right internal iliac artery origin. External iliac arteries are patent. Veins: No obvious venous abnormality within the limitations of this arterial phase study. Review of the MIP images confirms the above findings. NON-VASCULAR Hepatobiliary: No calcified gallstone. No biliary dilatation. Possible surface nodularity of the liver. Pancreas: Unremarkable. No pancreatic ductal dilatation or surrounding inflammatory changes. Spleen: Normal in size without focal abnormality. Adrenals/Urinary Tract: Adrenal glands are normal. No hydronephrosis. 7 mm stone lower pole right kidney. Bladder is unremarkable Stomach/Bowel: The stomach is nonenlarged. Wall thickening and mucosal enhancement of the  pylorus. Irregular wall thickening of the duodenal bulb with surrounding inflammatory changes, series 5, image 179. Wall thickening second portion of duodenum with surrounding fluid and stranding. Focal gas and fluid collection measuring 1.7 x 1.3 cm on series 5, image 179 posterior to the duodenal bulb which may reflect an ulcer crater or contained perforation. Remainder of the small bowel is nondistended. Lymphatic: No suspicious lymph nodes Reproductive: Negative prostate Other: Negative for pelvic effusion.  No free gas is visualized. Musculoskeletal: No acute or suspicious osseous abnormality Review of the MIP images confirms the above findings. IMPRESSION: 1. Negative for acute aortic dissection or aneurysm. 2. Wall thickening and mucosal enhancement of the pylorus, first and second portion of duodenum with surrounding inflammatory changes, findings are consistent with gastritis/duodenitis/peptic ulcer disease. Focal gas and fluid collection measuring 1.7 cm posterior to the duodenal bulb which may reflect an ulcer crater or contained perforation. No free gas visualized on this exam. 3. Circumferential mid to distal esophageal thickening, question esophagitis or reflux. Correlate with endoscopy if not already performed 4. Possible surface nodularity of the liver, correlate for cirrhosis. 5. Nonobstructing right kidney stone. Aortic Atherosclerosis (ICD10-I70.0) and Emphysema (ICD10-J43.9). Electronically Signed   By: Jasmine Pang M.D.   On: 03/29/2023 21:18    Impression/Plan: -Perforated duodenal ulcer in a patient with known history of peptic ulcer disease with gastric and duodenal ulcers based on 2 endoscopies in 2023.Patient admits drinking 80 ounce of beer every day along with half to 1 pack of smoking as well as using 2 packs of Goody's powders per day. 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 pylori infection.  S/p treatment with antibiotics.  Biopsies were  negative for H. pylori in July 2023.  Recommendations --------------------------- -Patient with perforated ulcer disease likely from ongoing NSAID use, smoking and alcohol use.  Upper GI series showed no extravasation of the contrast but showed irregular narrowing of the proximal duodenum which is likely from ulcer disease.  Less likely malignancy given no mass lesion seen on 2 previous endoscopy as well as recent CT scan. -EGD  is contraindicated at this time -Recommend to keep him on Protonix 40 mg twice a day for long-term at this time. -Recommend to avoid NSAIDs, smoking and alcohol use. -No further inpatient GI workup planned at this time.  Diet advancement per surgical team.  Follow-up with primary GI doctor in 2 months after discharge to arrange for outpatient elective endoscopy. -GI will sign off.  Discussed with hospitalist.  Call us back if needed.   LOS: 1 day   Kathi Der  MD, FACP 03/31/2023, 10:39 AM  Contact #  [phone]  

## 2023-04-01 LAB — CBC WITH DIFFERENTIAL/PLATELET
Abs Immature Granulocytes: 0.05 10*3/uL (ref 0.00–0.07)
Basophils Absolute: 0.1 10*3/uL (ref 0.0–0.1)
Basophils Relative: 1 %
Eosinophils Absolute: 0.2 10*3/uL (ref 0.0–0.5)
Eosinophils Relative: 4 %
HCT: 27.2 % — ABNORMAL LOW (ref 39.0–52.0)
Hemoglobin: 8.8 g/dL — ABNORMAL LOW (ref 13.0–17.0)
Immature Granulocytes: 1 %
Lymphocytes Relative: 16 %
Lymphs Abs: 0.9 10*3/uL (ref 0.7–4.0)
MCH: 28.9 pg (ref 26.0–34.0)
MCHC: 32.4 g/dL (ref 30.0–36.0)
MCV: 89.5 fL (ref 80.0–100.0)
Monocytes Absolute: 0.8 10*3/uL (ref 0.1–1.0)
Monocytes Relative: 14 %
Neutro Abs: 3.6 10*3/uL (ref 1.7–7.7)
Neutrophils Relative %: 64 %
Platelets: 112 10*3/uL — ABNORMAL LOW (ref 150–400)
RBC: 3.04 MIL/uL — ABNORMAL LOW (ref 4.22–5.81)
RDW: 16.1 % — ABNORMAL HIGH (ref 11.5–15.5)
WBC: 5.5 10*3/uL (ref 4.0–10.5)
nRBC: 0 % (ref 0.0–0.2)

## 2023-04-01 LAB — C-REACTIVE PROTEIN: CRP: 3.7 mg/dL — ABNORMAL HIGH (ref <1.0)

## 2023-04-01 LAB — COMPREHENSIVE METABOLIC PANEL
ALT: 16 U/L (ref 0–44)
AST: 32 U/L (ref 15–41)
Albumin: 3 g/dL — ABNORMAL LOW (ref 3.5–5.0)
Alkaline Phosphatase: 27 U/L — ABNORMAL LOW (ref 38–126)
Anion gap: 12 (ref 5–15)
BUN: 11 mg/dL (ref 8–23)
CO2: 27 mmol/L (ref 22–32)
Calcium: 7.9 mg/dL — ABNORMAL LOW (ref 8.9–10.3)
Chloride: 95 mmol/L — ABNORMAL LOW (ref 98–111)
Creatinine, Ser: 0.88 mg/dL (ref 0.61–1.24)
GFR, Estimated: >60 mL/min (ref >60)
Glucose, Bld: 68 mg/dL — ABNORMAL LOW (ref 70–99)
Potassium: 2.9 mmol/L — ABNORMAL LOW (ref 3.5–5.1)
Sodium: 134 mmol/L — ABNORMAL LOW (ref 135–145)
Total Bilirubin: 1 mg/dL (ref 0.3–1.2)
Total Protein: 6.3 g/dL — ABNORMAL LOW (ref 6.5–8.1)

## 2023-04-01 LAB — BRAIN NATRIURETIC PEPTIDE: B Natriuretic Peptide: 70.2 pg/mL (ref 0.0–100.0)

## 2023-04-01 LAB — PROCALCITONIN: Procalcitonin: <0.1 ng/mL

## 2023-04-01 LAB — GLUCOSE, CAPILLARY
Glucose-Capillary: 71 mg/dL (ref 70–99)
Glucose-Capillary: 179 mg/dL — ABNORMAL HIGH (ref 70–99)

## 2023-04-01 LAB — MAGNESIUM: Magnesium: 2.1 mg/dL (ref 1.7–2.4)

## 2023-04-01 MED ORDER — POTASSIUM CHLORIDE CRYS ER 20 MEQ PO TBCR
40.0000 meq | EXTENDED_RELEASE_TABLET | Freq: Four times a day (QID) | ORAL | Status: DC
Start: 2023-04-01 — End: 2023-04-01

## 2023-04-01 MED ORDER — POTASSIUM CHLORIDE 10 MEQ/100ML IV SOLN
INTRAVENOUS | Status: AC
  Filled 2023-04-01: qty 100

## 2023-04-01 MED ORDER — DEXTROSE 50 % IV SOLN
50.0000 mL | Freq: Once | INTRAVENOUS | Status: AC
  Administered 2023-04-01: 50 mL via INTRAVENOUS
  Filled 2023-04-01: qty 50

## 2023-04-01 MED ORDER — POTASSIUM CHLORIDE 10 MEQ/100ML IV SOLN
10.0000 meq | INTRAVENOUS | Status: DC
Start: 2023-04-01 — End: 2023-04-01

## 2023-04-01 MED ORDER — LACTATED RINGERS IV SOLN: INTRAVENOUS | Status: AC

## 2023-04-01 MED ORDER — DEXTROSE 10 % IV SOLN: INTRAVENOUS | Status: DC

## 2023-04-01 MED ORDER — POTASSIUM CHLORIDE 10 MEQ/100ML IV SOLN
10.0000 meq | INTRAVENOUS | Status: AC
  Administered 2023-04-01 (×8): 10 meq via INTRAVENOUS
  Filled 2023-04-01 (×6): qty 100

## 2023-04-01 MED ORDER — GLUCOSE 40 % PO GEL: 1.0000 | ORAL | Status: DC | PRN

## 2023-04-01 NOTE — Progress Notes (Addendum)
Subjective: Reports abd pain is improving daily. Wants to eat. Afebrile, WBC normal. Upper GI was done 9/12 morning and did not show any evidence of leak, but there is some narrowing of proximal duodenum.   Objective: Vital signs in last 24 hours: Temp:  [97.8 F (36.6 C)-98 F (36.7 C)] 97.8 F (36.6 C) (09/14 0805) Pulse Rate:  [69-85] 84 (09/14 0805) Resp:  [12-19] 15 (09/14 0805) BP: (124-142)/(76-86) 125/78 (09/14 0805) SpO2:  [88 %-100 %] 95 % (09/14 0958) Last BM Date : 03/29/23  Intake/Output from previous day: 09/13 0701 - 09/14 0700 In: 1614.9 [I.V.:1464.3; IV Piggyback:150.7] Out: 150 [Urine:150] Intake/Output this shift: No intake/output data recorded.  PE: Gen: NAD, laying in bed Abd: soft, mild tenderness to palpation in RUQ, no rebound tenderness or guarding.  Lab Results:  Recent Labs    03/31/23 0625 04/01/23 0753  WBC 8.2 5.5  HGB 8.5* 8.8*  HCT 25.7* 27.2*  PLT 108* 112*   BMET Recent Labs    03/31/23 0625 04/01/23 0753  NA 130* 134*  K 3.5 2.9*  CL 91* 95*  CO2 29 27  GLUCOSE 77 68*  BUN 7* 11  CREATININE 0.79 0.88  CALCIUM 8.0* 7.9*   PT/INR Recent Labs    03/30/23 0251  LABPROT 13.9  INR 1.1   CMP     Component Value Date/Time   NA 134 (L) 04/01/2023 0753   K 2.9 (L) 04/01/2023 0753   CL 95 (L) 04/01/2023 0753   CO2 27 04/01/2023 0753   GLUCOSE 68 (L) 04/01/2023 0753   BUN 11 04/01/2023 0753   CREATININE 0.88 04/01/2023 0753   CALCIUM 7.9 (L) 04/01/2023 0753   PROT 6.3 (L) 04/01/2023 0753   ALBUMIN 3.0 (L) 04/01/2023 0753   AST 32 04/01/2023 0753   ALT 16 04/01/2023 0753   ALKPHOS 27 (L) 04/01/2023 0753   BILITOT 1.0 04/01/2023 0753   GFRNONAA >60 04/01/2023 0753   GFRAA >60 03/29/2020 1502   Lipase     Component Value Date/Time   LIPASE 57 (H) 03/29/2023 1637       Studies/Results: DG Chest Port 1 View  Result Date: 03/31/2023 CLINICAL DATA:  Shortness of breath. EXAM: PORTABLE CHEST 1 VIEW  COMPARISON:  07/09/2022 FINDINGS: The lungs are clear without focal pneumonia, edema, pneumothorax or pleural effusion. The cardiopericardial silhouette is within normal limits for size. No acute bony abnormality. Telemetry leads overlie the chest. IMPRESSION: No active disease. Electronically Signed   By: Kennith Center M.D.   On: 03/31/2023 07:54   DG Abd Portable 1V  Result Date: 03/31/2023 CLINICAL DATA:  Abdominal pain. EXAM: PORTABLE ABDOMEN - 1 VIEW COMPARISON:  CT scan 03/29/2023.  Upper GI series 03/30/2023 FINDINGS: No gaseous small bowel dilatation. Contrast material is distributed along the length of a nondilated colon. Degenerative changes are noted in the lumbar spine. IMPRESSION: Nonobstructive bowel gas pattern. Electronically Signed   By: Kennith Center M.D.   On: 03/31/2023 07:54      Assessment/Plan Contained duodenal perforation, likely secondary to ulcer disease -known history of ulcer disease -Continue BID IV PPI and antibiotics -Strict NPO -Upper GI yesterday did not show contrast extravasation.  Hold on clears today, do not recommend advancing past today.  -Patient will need outpatient GI follow up for an EGD to rule out an underlying malignancy -Surgery will continue to follow  I reviewed hospitalist notes, last 24 h vitals and pain scores, last 48 h intake  and output, last 24 h labs and trends, and last 24 h imaging results.   LOS: 2 days   I spent a total of 35 minutes in both face-to-face and non-face-to-face activities, excluding procedures performed, for this visit on the date of this encounter.  Check [email or web address] for General Surgery coverage night/weekend/holidays  Page if acute issues. No secure chat available for me given surgeries/clinic/off post call which would lead to a delay in care.  Marin Olp, MD [facility], [facility] 04/01/23 10:53 AM

## 2023-04-01 NOTE — Plan of Care (Signed)

## 2023-04-01 NOTE — Progress Notes (Signed)
PROGRESS NOTE                                                                                                                                                                                                             Patient Demographics:    Christopher Logan, is a 62 y.o. male, DOB - August 04, 1960, ZOX:[id number]  Outpatient Primary MD for the patient is [name], MD    LOS - 2  Admit date - 03/29/2023    Chief Complaint  Patient presents with   Abdominal Pain   Flank Pain       Brief Narrative (HPI from H&P)     62 y.o. male with medical history significant for PUD, portal HTN gastropathy with last EGD 8/'23, anemia, HTN, compensated cirrhosis, treated Hep C, alcohol abuse, current smoking, who presents with acute onset severe epigastric abd pain x 1 day. Acute and abrupt onset of severe epigastric pain, 10/10, radiating to his back starting on the afternoon of admission.  ER workup suggestive of contained upper GI perforation, surgery was consulted and he was admitted for further care.   Subjective:   Patient in bed, appears comfortable, denies any headache, no fever, no chest pain or pressure, no shortness of breath , no abdominal pain. No focal weakness.   Assessment  & Plan :    Contained duodenal perforation - Acute, severe epigastric abd pain - Rest, IV PPI, general surgery on board, cardiology consulted, being treated conservatively, continue IV fluids, IV antibiotics and monitor clinically.   Anemia, acute on chronic , Thrombocytopenia - has evidence of both iron deficiency and B12 deficiency, replace B12, placed on oral iron once he is better.    Hypokalemia, hypomagnesemia asymptomatic, treating - Replete, monitor   Hyponatremia.  SIADH, stable, currently requiring IV fluids, monitor.  Alcohol abuse.  Counseled to quit.  CIWA protocol, IV fluids and thiamine and folic acid.  Will place on IV phenobarb as  well.   Compensated cirrhosis, Hx HCV, treated  - follows with [facility].  Consulted.  No acute issues   Current smoker: Nicotine patch, counseled to quit.   HTN: - As needed IV hydralazine        Condition - Fair  Family Communication  : Merlene Laughter [phone]  on  03/31/2023  Code Status :  Full  Consults  :  CCS, GI  PUD Prophylaxis : PPI   Procedures  :     CTA -  1. Negative for acute aortic dissection or aneurysm. 2. Wall thickening and mucosal enhancement of the pylorus, first and second portion of duodenum with surrounding inflammatory changes, findings are consistent with gastritis/duodenitis/peptic ulcer disease. Focal gas and fluid collection measuring 1.7 cm posterior to the duodenal bulb which may reflect an ulcer crater or contained perforation. No free gas visualized on this exam. 3. Circumferential mid to distal esophageal thickening, question esophagitis or reflux. Correlate with endoscopy if not already performed 4. Possible surface nodularity of the liver, correlate for cirrhosis. 5. Nonobstructing right kidney stone. Aortic Atherosclerosis (ICD10-I70.0) and Emphysema   Barium -  Irregular narrowing of the proximal duodenum consistent with known segment of inflammation. No extravasation of contrast is identified to indicate continued leak      Disposition Plan  :    Status is: Inpatient   DVT Prophylaxis  :    heparin injection 5,000 Units Start: 03/31/23 0915 SCDs Start: 03/30/23 0109    Lab Results  Component Value Date   PLT 112 (L) 04/01/2023    Diet :  Diet Order             Diet NPO time specified  Diet effective now                    Inpatient Medications  Scheduled Meds:  cyanocobalamin  1,000 mcg Subcutaneous Daily   folic acid  1 mg Intravenous Daily   heparin injection (subcutaneous)  5,000 Units Subcutaneous Q8H   LORazepam  0-4 mg Intravenous Q6H   Followed by   Melene Muller ON 04/02/2023] LORazepam  0-4 mg Intravenous Q12H    multivitamin with minerals  1 tablet Oral Daily   nicotine  14 mg Transdermal Daily   pantoprazole (PROTONIX) IV  40 mg Intravenous Q12H   PHENObarbital  65 mg Intravenous Q8H   Followed by   [START ON 04/02/2023] PHENObarbital  32.5 mg Intravenous Q8H   sodium chloride flush  3 mL Intravenous Q12H   thiamine  100 mg Oral Daily   Or   thiamine  100 mg Intravenous Daily   Continuous Infusions:  lactated ringers     piperacillin-tazobactam (ZOSYN)  IV 3.375 g (04/01/23 0618)   potassium chloride     PRN Meds:.acetaminophen, hydrALAZINE, HYDROmorphone (DILAUDID) injection **OR** HYDROmorphone (DILAUDID) injection, LORazepam **OR** LORazepam, ondansetron (ZOFRAN) IV     Objective:   Vitals:   03/31/23 1533 03/31/23 2300 04/01/23 0000 04/01/23 0600  BP: 124/82 (!) 140/81    Pulse: 85  76 69  Resp: 13 19    Temp: 97.8 F (36.6 C) 97.8 F (36.6 C)    TempSrc: Oral Oral    SpO2: 98%     Weight:      Height:        Wt Readings from Last 3 Encounters:  03/30/23 65.8 kg  10/07/22 63.5 kg  02/12/22 62.5 kg     Intake/Output Summary (Last 24 hours) at 04/01/2023 0858 Last data filed at 04/01/2023 7253 Gross per 24 hour  Intake 1614.92 ml  Output 150 ml  Net 1464.92 ml     Physical Exam  Awake Alert, No new F.N deficits, minimal DTs [place].AT,PERRAL Supple Neck, No JVD,   Symmetrical Chest wall movement, Good air movement bilaterally, CTAB RRR,No Gallops,Rubs or new Murmurs,  +ve B.Sounds, Abd Soft, mild generalized tenderness No Cyanosis, Clubbing or edema  Data Review:    Recent Labs  Lab 03/29/23 1637 03/30/23 1217 03/31/23 0625 04/01/23 0753  WBC 9.9 7.3 8.2 5.5  HGB 8.7* 8.7* 8.5* 8.8*  HCT 26.9* 27.1* 25.7* 27.2*  PLT 123* 109* 108* 112*  MCV 87.1 86.3 88.9 89.5  MCH 28.2 27.7 29.4 28.9  MCHC 32.3 32.1 33.1 32.4  RDW 16.5* 16.3* 16.1* 16.1*  LYMPHSABS  --   --  1.0 0.9  MONOABS  --   --  1.2* 0.8  EOSABS  --   --  0.2 0.2  BASOSABS  --   --   0.1 0.1    Recent Labs  Lab 03/29/23 1637 03/30/23 0251 03/30/23 0716 03/31/23 0625 04/01/23 0753  NA 134* 134*  --  130* 134*  K 2.9* 3.3*  --  3.5 2.9*  CL 96* 98  --  91* 95*  CO2 24 26  --  29 27  ANIONGAP 14 10  --  10 12  GLUCOSE 89 95  --  77 68*  BUN 9 6*  --  7* 11  CREATININE 0.78 0.62  --  0.79 0.88  AST 45* 34  --  31 32  ALT 18 15  --  15 16  ALKPHOS 32* 26*  --  28* 27*  BILITOT 0.6 0.7  --  0.8 1.0  ALBUMIN 3.9 3.2*  --  3.0* 3.0*  CRP  --   --   --  2.1*  --   PROCALCITON  --   --   --  <0.10  --   LATICACIDVEN  --  1.0 0.9  --   --   INR  --  1.1  --   --   --   BNP  --   --   --  107.1*  --   MG  --   --   --  1.5* 2.1  CALCIUM 9.2 8.1*  --  8.0* 7.9*      Recent Labs  Lab 03/29/23 1637 03/30/23 0251 03/30/23 0716 03/31/23 0625 04/01/23 0753  CRP  --   --   --  2.1*  --   PROCALCITON  --   --   --  <0.10  --   LATICACIDVEN  --  1.0 0.9  --   --   INR  --  1.1  --   --   --   BNP  --   --   --  107.1*  --   MG  --   --   --  1.5* 2.1  CALCIUM 9.2 8.1*  --  8.0* 7.9*    --------------------------------------------------------------------------------------------------------------- No results found for: "CHOL", "HDL", "LDLCALC", "LDLDIRECT", "TRIG", "CHOLHDL"  No results found for: "HGBA1C" No results for input(s): "TSH", "T4TOTAL", "FREET4", "T3FREE", "THYROIDAB" in the last 72 hours. Recent Labs    03/30/23 0251  VITAMINB12 130*  FERRITIN 31  TIBC 421  IRON 34*     Radiology Reports DG Chest Port 1 View  Result Date: 03/31/2023 CLINICAL DATA:  Shortness of breath. EXAM: PORTABLE CHEST 1 VIEW COMPARISON:  07/09/2022 FINDINGS: The lungs are clear without focal pneumonia, edema, pneumothorax or pleural effusion. The cardiopericardial silhouette is within normal limits for size. No acute bony abnormality. Telemetry leads overlie the chest. IMPRESSION: No active disease. Electronically Signed   By: Kennith Center M.D.   On: 03/31/2023 07:54    DG Abd Portable 1V  Result Date: 03/31/2023 CLINICAL DATA:  Abdominal pain. EXAM: PORTABLE ABDOMEN -  1 VIEW COMPARISON:  CT scan 03/29/2023.  Upper GI series 03/30/2023 FINDINGS: No gaseous small bowel dilatation. Contrast material is distributed along the length of a nondilated colon. Degenerative changes are noted in the lumbar spine. IMPRESSION: Nonobstructive bowel gas pattern. Electronically Signed   By: Kennith Center M.D.   On: 03/31/2023 07:54   DG UGI W SINGLE CM (SOL OR THIN BA)  Result Date: 03/30/2023 CLINICAL DATA:  62 year old male with history of EtOH use presents with concern for perforated duodenal ulcer. EXAM: DG UGI W SINGLE CM TECHNIQUE: Scout radiograph was obtained. Single contrast examination was performed using Omnipaque 300. This exam was performed by Loyce Dys PA-C, and was supervised and interpreted by Acquanetta Belling, MD. FLUOROSCOPY: Radiation Exposure Index (as provided by the fluoroscopic device): 54.4 mGy Kerma COMPARISON:  CT ANGIO CHEST ABD PELV 03/29/23. FINDINGS: Scout radiograph with normal bowel gas pattern. No significant abnormality of the distal esophagus. Adequate filling of the stomach with 100 mL Omnipaque 300. No evidence of gastric ulcers or lesions. Contrast empties appropriately into the duodenal bulb without evidence of extravasation. The proximal duodenum is irregularly narrowed. Contrast fills the remaining portion of the duodenum without evidence of extravasation. IMPRESSION: Irregular narrowing of the proximal duodenum consistent with known segment of inflammation. No extravasation of contrast is identified to indicate continued leak. Correlation with endoscopy is recommended when patient condition allows to exclude underlying mass. Electronically Signed   By: Acquanetta Belling M.D.   On: 03/30/2023 12:40   CT Angio Chest/Abd/Pel for Dissection W and/or Wo Contrast  Result Date: 03/29/2023 CLINICAL DATA:  Emesis flank pain EXAM: CT ANGIOGRAPHY CHEST,  ABDOMEN AND PELVIS TECHNIQUE: CT 02/11/2022 Multidetector CT imaging through the chest, abdomen and pelvis was performed using the standard protocol during bolus administration of intravenous contrast. Multiplanar reconstructed images and MIPs were obtained and reviewed to evaluate the vascular anatomy. RADIATION DOSE REDUCTION: This exam was performed according to the departmental dose-optimization program which includes automated exposure control, adjustment of the mA and/or kV according to patient size and/or use of iterative reconstruction technique. CONTRAST:  OMNIPAQUE IOHEXOL 350 MG/ML SOLN COMPARISON:  CT 02/11/2022 FINDINGS: CTA CHEST FINDINGS Cardiovascular: Non contrasted images of the chest demonstrate no acute intramural hematoma. Moderate aortic atherosclerosis. No aneurysm. No dissection. Coronary vascular calcification. Normal cardiac size. No pericardial effusion. Mediastinum/Nodes: Midline trachea. No thyroid mass. No suspicious lymph nodes. Subcentimeter AP window lymph nodes. Circumferential mid to distal esophageal thickening. Lungs/Pleura: No consolidation or effusion. Minimal emphysema. Minimal ground-glass density within the right greater than left lung bases. Musculoskeletal: No acute osseous abnormality. Probable old sternal fracture Review of the MIP images confirms the above findings. CTA ABDOMEN AND PELVIS FINDINGS VASCULAR Aorta: Normal caliber aorta without aneurysm, dissection, vasculitis or significant stenosis. Moderate aortic atherosclerosis. Celiac: Patent without evidence of aneurysm, dissection, vasculitis or significant stenosis. Origin calcification. SMA: Patent without evidence of aneurysm, dissection, vasculitis or significant stenosis. Origin calcification. Renals: Single right and single left renal arteries with origin calcification but no stenosis or dissection. 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 IMA: Diminutive but  patent. Inflow: Moderate severe atherosclerosis. Mild narrowing of the right internal iliac artery origin. External iliac arteries are patent. Veins: No obvious venous abnormality within the limitations of this arterial phase study. Review of the MIP images confirms the above findings. NON-VASCULAR Hepatobiliary: No calcified gallstone. No biliary dilatation. Possible surface nodularity of the liver. Pancreas: Unremarkable. No pancreatic  ductal dilatation or surrounding inflammatory changes. Spleen: Normal in size without focal abnormality. Adrenals/Urinary Tract: Adrenal glands are normal. No hydronephrosis. 7 mm stone lower pole right kidney. Bladder is unremarkable Stomach/Bowel: The stomach is nonenlarged. Wall thickening and mucosal enhancement of the pylorus. Irregular wall thickening of the duodenal bulb with surrounding inflammatory changes, series 5, image 179. Wall thickening second portion of duodenum with surrounding fluid and stranding. Focal gas and fluid collection measuring 1.7 x 1.3 cm on series 5, image 179 posterior to the duodenal bulb which may reflect an ulcer crater or contained perforation. Remainder of the small bowel is nondistended. Lymphatic: No suspicious lymph nodes Reproductive: Negative prostate Other: Negative for pelvic effusion.  No free gas is visualized. Musculoskeletal: No acute or suspicious osseous abnormality Review of the MIP images confirms the above findings. IMPRESSION: 1. Negative for acute aortic dissection or aneurysm. 2. Wall thickening and mucosal enhancement of the pylorus, first and second portion of duodenum with surrounding inflammatory changes, findings are consistent with gastritis/duodenitis/peptic ulcer disease. Focal gas and fluid collection measuring 1.7 cm posterior to the duodenal bulb which may reflect an ulcer crater or contained perforation. No free gas visualized on this exam. 3. Circumferential mid to distal esophageal thickening, question  esophagitis or reflux. Correlate with endoscopy if not already performed 4. Possible surface nodularity of the liver, correlate for cirrhosis. 5. Nonobstructing right kidney stone. Aortic Atherosclerosis (ICD10-I70.0) and Emphysema (ICD10-J43.9). Electronically Signed   By: Jasmine Pang M.D.   On: 03/29/2023 21:18      Signature  -   Susa Raring M.D on 04/01/2023 at 8:58 AM   -  To page go to [email or web address]

## 2023-04-02 ENCOUNTER — Inpatient Hospital Stay (HOSPITAL_COMMUNITY): Payer: Self-pay

## 2023-04-02 LAB — CBC WITH DIFFERENTIAL/PLATELET
Abs Immature Granulocytes: 0.04 10*3/uL (ref 0.00–0.07)
Basophils Absolute: 0.1 10*3/uL (ref 0.0–0.1)
Basophils Relative: 1 %
Eosinophils Absolute: 0.2 10*3/uL (ref 0.0–0.5)
Eosinophils Relative: 4 %
HCT: 25.4 % — ABNORMAL LOW (ref 39.0–52.0)
Hemoglobin: 8.1 g/dL — ABNORMAL LOW (ref 13.0–17.0)
Immature Granulocytes: 1 %
Lymphocytes Relative: 18 %
Lymphs Abs: 1 10*3/uL (ref 0.7–4.0)
MCH: 28.3 pg (ref 26.0–34.0)
MCHC: 31.9 g/dL (ref 30.0–36.0)
MCV: 88.8 fL (ref 80.0–100.0)
Monocytes Absolute: 0.7 10*3/uL (ref 0.1–1.0)
Monocytes Relative: 14 %
Neutro Abs: 3.4 10*3/uL (ref 1.7–7.7)
Neutrophils Relative %: 62 %
Platelets: 112 10*3/uL — ABNORMAL LOW (ref 150–400)
RBC: 2.86 MIL/uL — ABNORMAL LOW (ref 4.22–5.81)
RDW: 15.9 % — ABNORMAL HIGH (ref 11.5–15.5)
WBC: 5.4 10*3/uL (ref 4.0–10.5)
nRBC: 0 % (ref 0.0–0.2)

## 2023-04-02 LAB — COMPREHENSIVE METABOLIC PANEL
ALT: 12 U/L (ref 0–44)
AST: 27 U/L (ref 15–41)
Albumin: 2.8 g/dL — ABNORMAL LOW (ref 3.5–5.0)
Alkaline Phosphatase: 24 U/L — ABNORMAL LOW (ref 38–126)
Anion gap: 12 (ref 5–15)
BUN: 6 mg/dL — ABNORMAL LOW (ref 8–23)
CO2: 27 mmol/L (ref 22–32)
Calcium: 7.9 mg/dL — ABNORMAL LOW (ref 8.9–10.3)
Chloride: 93 mmol/L — ABNORMAL LOW (ref 98–111)
Creatinine, Ser: 0.69 mg/dL (ref 0.61–1.24)
GFR, Estimated: >60 mL/min (ref >60)
Glucose, Bld: 100 mg/dL — ABNORMAL HIGH (ref 70–99)
Potassium: 3.1 mmol/L — ABNORMAL LOW (ref 3.5–5.1)
Sodium: 132 mmol/L — ABNORMAL LOW (ref 135–145)
Total Bilirubin: 0.8 mg/dL (ref 0.3–1.2)
Total Protein: 6.3 g/dL — ABNORMAL LOW (ref 6.5–8.1)

## 2023-04-02 LAB — PROCALCITONIN: Procalcitonin: <0.1 ng/mL

## 2023-04-02 LAB — TROPONIN I (HIGH SENSITIVITY)
Troponin I (High Sensitivity): 7 ng/L (ref <18)
Troponin I (High Sensitivity): 7 ng/L (ref <18)

## 2023-04-02 LAB — MAGNESIUM: Magnesium: 1.6 mg/dL — ABNORMAL LOW (ref 1.7–2.4)

## 2023-04-02 LAB — C-REACTIVE PROTEIN: CRP: 1.9 mg/dL — ABNORMAL HIGH (ref <1.0)

## 2023-04-02 LAB — BRAIN NATRIURETIC PEPTIDE: B Natriuretic Peptide: 69 pg/mL (ref 0.0–100.0)

## 2023-04-02 MED ORDER — MAGNESIUM SULFATE 4 GM/100ML IV SOLN
4.0000 g | Freq: Once | INTRAVENOUS | Status: AC
  Administered 2023-04-02: 4 g via INTRAVENOUS
  Filled 2023-04-02: qty 100

## 2023-04-02 MED ORDER — IOHEXOL 9 MG/ML PO SOLN
500.0000 mL | ORAL | Status: AC
  Administered 2023-04-02 (×2): 500 mL via ORAL

## 2023-04-02 MED ORDER — IOHEXOL 350 MG/ML SOLN
75.0000 mL | Freq: Once | INTRAVENOUS | Status: AC | PRN
  Administered 2023-04-02: 75 mL via INTRAVENOUS

## 2023-04-02 MED ORDER — NITROGLYCERIN 0.4 MG SL SUBL
0.4000 mg | SUBLINGUAL_TABLET | SUBLINGUAL | Status: DC | PRN
  Administered 2023-04-02: 0.4 mg via SUBLINGUAL
  Filled 2023-04-02: qty 1

## 2023-04-02 MED ORDER — POTASSIUM CHLORIDE CRYS ER 20 MEQ PO TBCR
40.0000 meq | EXTENDED_RELEASE_TABLET | Freq: Two times a day (BID) | ORAL | Status: AC
  Administered 2023-04-02 (×2): 40 meq via ORAL
  Filled 2023-04-02 (×2): qty 2

## 2023-04-02 NOTE — Progress Notes (Signed)
PROGRESS NOTE                                                                                                                                                                                                             Patient Demographics:    Christopher Logan, is a 62 y.o. male, DOB - [birth year], WUJ:[id number]  Outpatient Primary MD for the patient is [name], MD    LOS - 3  Admit date - 03/29/2023    Chief Complaint  Patient presents with   Abdominal Pain   Flank Pain       Brief Narrative (HPI from H&P)     62 y.o. male with medical history significant for PUD, portal HTN gastropathy with last EGD 8/'23, anemia, HTN, compensated cirrhosis, treated Hep C, alcohol abuse, current smoking, who presents with acute onset severe epigastric abd pain x 1 day. Acute and abrupt onset of severe epigastric pain, 10/10, radiating to his back starting on the afternoon of admission.  ER workup suggestive of contained upper GI perforation, surgery was consulted and he was admitted for further care.   Subjective:   Patient in bed, appears comfortable, denies any headache, no fever, no chest pain or pressure, no shortness of breath , improved abdominal pain. No focal weakness.   Assessment  & Plan :    Contained duodenal perforation - Acute, severe epigastric abd pain - Rest, IV PPI, general surgery on board, cardiology consulted, being treated conservatively, continue IV fluids, IV antibiotics and monitor clinically.  He is discussed with general surgeon on 04/01/2023, clear liquid diet and monitor.   Anemia, acute on chronic , Thrombocytopenia - has evidence of both iron deficiency and B12 deficiency, replace B12, placed on oral iron once he is better.    Hypokalemia, hypomagnesemia asymptomatic, treating - Replete, monitor   Hyponatremia.  SIADH, stable, currently requiring IV fluids, monitor.  Alcohol abuse.  Counseled to quit.   CIWA protocol, IV fluids and thiamine and folic acid.  Will place on IV phenobarb as well.   Compensated cirrhosis, Hx HCV, treated  - follows with [facility].  Consulted.  No acute issues   Current smoker: Nicotine patch, counseled to quit.   HTN: - As needed IV hydralazine        Condition - Fair  Family Communication  : Sister Waynetta Sandy  [phone]  on  03/31/2023  Code Status :  Full  Consults  :  CCS, GI  PUD Prophylaxis : PPI   Procedures  :     CTA -  1. Negative for acute aortic dissection or aneurysm. 2. Wall thickening and mucosal enhancement of the pylorus, first and second portion of duodenum with surrounding inflammatory changes, findings are consistent with gastritis/duodenitis/peptic ulcer disease. Focal gas and fluid collection measuring 1.7 cm posterior to the duodenal bulb which may reflect an ulcer crater or contained perforation. No free gas visualized on this exam. 3. Circumferential mid to distal esophageal thickening, question esophagitis or reflux. Correlate with endoscopy if not already performed 4. Possible surface nodularity of the liver, correlate for cirrhosis. 5. Nonobstructing right kidney stone. Aortic Atherosclerosis (ICD10-I70.0) and Emphysema   Barium -  Irregular narrowing of the proximal duodenum consistent with known segment of inflammation. No extravasation of contrast is identified to indicate continued leak      Disposition Plan  :    Status is: Inpatient   DVT Prophylaxis  :    heparin injection 5,000 Units Start: 03/31/23 0915 SCDs Start: 03/30/23 0109    Lab Results  Component Value Date   PLT 112 (L) 04/02/2023    Diet :  Diet Order             Diet clear liquid Room service appropriate? Yes; Fluid consistency: Thin  Diet effective now                    Inpatient Medications  Scheduled Meds:  cyanocobalamin  1,000 mcg Subcutaneous Daily   folic acid  1 mg Intravenous Daily   heparin injection (subcutaneous)   5,000 Units Subcutaneous Q8H   LORazepam  0-4 mg Intravenous Q6H   Followed by   LORazepam  0-4 mg Intravenous Q12H   multivitamin with minerals  1 tablet Oral Daily   nicotine  14 mg Transdermal Daily   pantoprazole (PROTONIX) IV  40 mg Intravenous Q12H   PHENObarbital  32.5 mg Intravenous Q8H   potassium chloride  40 mEq Oral BID   sodium chloride flush  3 mL Intravenous Q12H   thiamine  100 mg Oral Daily   Or   thiamine  100 mg Intravenous Daily   Continuous Infusions:  dextrose 25 mL/hr at 04/02/23 0344   lactated ringers Stopped (04/02/23 0004)   piperacillin-tazobactam (ZOSYN)  IV 3.375 g (04/02/23 0658)   PRN Meds:.acetaminophen, hydrALAZINE, HYDROmorphone (DILAUDID) injection **OR** HYDROmorphone (DILAUDID) injection, LORazepam **OR** LORazepam, nitroGLYCERIN, ondansetron (ZOFRAN) IV     Objective:   Vitals:   04/02/23 0300 04/02/23 0359 04/02/23 0647 04/02/23 0800  BP: (!) 147/86 (!) 137/95  (!) 148/83  Pulse: 74 88 70 76  Resp: 15   12  Temp: 97.8 F (36.6 C) 98.1 F (36.7 C)  98.2 F (36.8 C)  TempSrc:  Oral  Oral  SpO2: 95% 99%  95%  Weight:      Height:        Wt Readings from Last 3 Encounters:  03/30/23 65.8 kg  10/07/22 63.5 kg  02/12/22 62.5 kg     Intake/Output Summary (Last 24 hours) at 04/02/2023 1007 Last data filed at 04/02/2023 0344 Gross per 24 hour  Intake 1273.9 ml  Output 550 ml  Net 723.9 ml     Physical Exam  Awake Alert, No new F.N deficits, minimal DTs [place].AT,PERRAL Supple Neck, No JVD,   Symmetrical Chest wall movement, Good air  movement bilaterally, CTAB RRR,No Gallops,Rubs or new Murmurs,  +ve B.Sounds, Abd Soft, mild generalized tenderness No Cyanosis, Clubbing or edema       Data Review:    Recent Labs  Lab 03/29/23 1637 03/30/23 1217 03/31/23 0625 04/01/23 0753 04/02/23 0424  WBC 9.9 7.3 8.2 5.5 5.4  HGB 8.7* 8.7* 8.5* 8.8* 8.1*  HCT 26.9* 27.1* 25.7* 27.2* 25.4*  PLT 123* 109* 108* 112* 112*  MCV 87.1  86.3 88.9 89.5 88.8  MCH 28.2 27.7 29.4 28.9 28.3  MCHC 32.3 32.1 33.1 32.4 31.9  RDW 16.5* 16.3* 16.1* 16.1* 15.9*  LYMPHSABS  --   --  1.0 0.9 1.0  MONOABS  --   --  1.2* 0.8 0.7  EOSABS  --   --  0.2 0.2 0.2  BASOSABS  --   --  0.1 0.1 0.1    Recent Labs  Lab 03/29/23 1637 03/30/23 0251 03/30/23 0716 03/31/23 0625 04/01/23 0753 04/02/23 0424  NA 134* 134*  --  130* 134* 132*  K 2.9* 3.3*  --  3.5 2.9* 3.1*  CL 96* 98  --  91* 95* 93*  CO2 24 26  --  29 27 27   ANIONGAP 14 10  --  10 12 12   GLUCOSE 89 95  --  77 68* 100*  BUN 9 6*  --  7* 11 6*  CREATININE 0.78 0.62  --  0.79 0.88 0.69  AST 45* 34  --  31 32 27  ALT 18 15  --  15 16 12   ALKPHOS 32* 26*  --  28* 27* 24*  BILITOT 0.6 0.7  --  0.8 1.0 0.8  ALBUMIN 3.9 3.2*  --  3.0* 3.0* 2.8*  CRP  --   --   --  2.1* 3.7* 1.9*  PROCALCITON  --   --   --  <0.10 <0.10 <0.10  LATICACIDVEN  --  1.0 0.9  --   --   --   INR  --  1.1  --   --   --   --   BNP  --   --   --  107.1* 70.2 69.0  MG  --   --   --  1.5* 2.1 1.6*  CALCIUM 9.2 8.1*  --  8.0* 7.9* 7.9*      Recent Labs  Lab 03/29/23 1637 03/30/23 0251 03/30/23 0716 03/31/23 0625 04/01/23 0753 04/02/23 0424  CRP  --   --   --  2.1* 3.7* 1.9*  PROCALCITON  --   --   --  <0.10 <0.10 <0.10  LATICACIDVEN  --  1.0 0.9  --   --   --   INR  --  1.1  --   --   --   --   BNP  --   --   --  107.1* 70.2 69.0  MG  --   --   --  1.5* 2.1 1.6*  CALCIUM 9.2 8.1*  --  8.0* 7.9* 7.9*    --------------------------------------------------------------------------------------------------------------- No results found for: "CHOL", "HDL", "LDLCALC", "LDLDIRECT", "TRIG", "CHOLHDL"  No results found for: "HGBA1C" No results for input(s): "TSH", "T4TOTAL", "FREET4", "T3FREE", "THYROIDAB" in the last 72 hours. No results for input(s): "VITAMINB12", "FOLATE", "FERRITIN", "TIBC", "IRON", "RETICCTPCT" in the last 72 hours.    Radiology Reports DG CHEST PORT 1 VIEW  Result Date:  04/02/2023 CLINICAL DATA:  Perforated duodenal ulcer.  Chest pain EXAM: PORTABLE CHEST 1 VIEW COMPARISON:  2 days ago  FINDINGS: Normal heart size and mediastinal contours. No acute infiltrate or edema. No effusion or pneumothorax. No acute osseous findings. IMPRESSION: Stable portable chest. Electronically Signed   By: Tiburcio Pea M.D.   On: 04/02/2023 05:12   DG Chest Port 1 View  Result Date: 03/31/2023 CLINICAL DATA:  Shortness of breath. EXAM: PORTABLE CHEST 1 VIEW COMPARISON:  07/09/2022 FINDINGS: The lungs are clear without focal pneumonia, edema, pneumothorax or pleural effusion. The cardiopericardial silhouette is within normal limits for size. No acute bony abnormality. Telemetry leads overlie the chest. IMPRESSION: No active disease. Electronically Signed   By: Kennith Center M.D.   On: 03/31/2023 07:54   DG Abd Portable 1V  Result Date: 03/31/2023 CLINICAL DATA:  Abdominal pain. EXAM: PORTABLE ABDOMEN - 1 VIEW COMPARISON:  CT scan 03/29/2023.  Upper GI series 03/30/2023 FINDINGS: No gaseous small bowel dilatation. Contrast material is distributed along the length of a nondilated colon. Degenerative changes are noted in the lumbar spine. IMPRESSION: Nonobstructive bowel gas pattern. Electronically Signed   By: Kennith Center M.D.   On: 03/31/2023 07:54   DG UGI W SINGLE CM (SOL OR THIN BA)  Result Date: 03/30/2023 CLINICAL DATA:  62 year old male with history of EtOH use presents with concern for perforated duodenal ulcer. EXAM: DG UGI W SINGLE CM TECHNIQUE: Scout radiograph was obtained. Single contrast examination was performed using Omnipaque 300. This exam was performed by Loyce Dys PA-C, and was supervised and interpreted by Acquanetta Belling, MD. FLUOROSCOPY: Radiation Exposure Index (as provided by the fluoroscopic device): 54.4 mGy Kerma COMPARISON:  CT ANGIO CHEST ABD PELV 03/29/23. FINDINGS: Scout radiograph with normal bowel gas pattern. No significant abnormality of the distal  esophagus. Adequate filling of the stomach with 100 mL Omnipaque 300. No evidence of gastric ulcers or lesions. Contrast empties appropriately into the duodenal bulb without evidence of extravasation. The proximal duodenum is irregularly narrowed. Contrast fills the remaining portion of the duodenum without evidence of extravasation. IMPRESSION: Irregular narrowing of the proximal duodenum consistent with known segment of inflammation. No extravasation of contrast is identified to indicate continued leak. Correlation with endoscopy is recommended when patient condition allows to exclude underlying mass. Electronically Signed   By: Acquanetta Belling M.D.   On: 03/30/2023 12:40   CT Angio Chest/Abd/Pel for Dissection W and/or Wo Contrast  Result Date: 03/29/2023 CLINICAL DATA:  Emesis flank pain EXAM: CT ANGIOGRAPHY CHEST, ABDOMEN AND PELVIS TECHNIQUE: CT 02/11/2022 Multidetector CT imaging through the chest, abdomen and pelvis was performed using the standard protocol during bolus administration of intravenous contrast. Multiplanar reconstructed images and MIPs were obtained and reviewed to evaluate the vascular anatomy. RADIATION DOSE REDUCTION: This exam was performed according to the departmental dose-optimization program which includes automated exposure control, adjustment of the mA and/or kV according to patient size and/or use of iterative reconstruction technique. CONTRAST:  OMNIPAQUE IOHEXOL 350 MG/ML SOLN COMPARISON:  CT 02/11/2022 FINDINGS: CTA CHEST FINDINGS Cardiovascular: Non contrasted images of the chest demonstrate no acute intramural hematoma. Moderate aortic atherosclerosis. No aneurysm. No dissection. Coronary vascular calcification. Normal cardiac size. No pericardial effusion. Mediastinum/Nodes: Midline trachea. No thyroid mass. No suspicious lymph nodes. Subcentimeter AP window lymph nodes. Circumferential mid to distal esophageal thickening. Lungs/Pleura: No consolidation or effusion.  Minimal emphysema. Minimal ground-glass density within the right greater than left lung bases. Musculoskeletal: No acute osseous abnormality. Probable old sternal fracture Review of the MIP images confirms the above findings. CTA ABDOMEN AND PELVIS FINDINGS VASCULAR Aorta: Normal  caliber aorta without aneurysm, dissection, vasculitis or significant stenosis. Moderate aortic atherosclerosis. Celiac: Patent without evidence of aneurysm, dissection, vasculitis or significant stenosis. Origin calcification. SMA: Patent without evidence of aneurysm, dissection, vasculitis or significant stenosis. Origin calcification. Renals: Single right and single left renal arteries with origin calcification but no stenosis or dissection. 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 IMA: Diminutive but patent. Inflow: Moderate severe atherosclerosis. Mild narrowing of the right internal iliac artery origin. External iliac arteries are patent. Veins: No obvious venous abnormality within the limitations of this arterial phase study. Review of the MIP images confirms the above findings. NON-VASCULAR Hepatobiliary: No calcified gallstone. No biliary dilatation. Possible surface nodularity of the liver. Pancreas: Unremarkable. No pancreatic ductal dilatation or surrounding inflammatory changes. Spleen: Normal in size without focal abnormality. Adrenals/Urinary Tract: Adrenal glands are normal. No hydronephrosis. 7 mm stone lower pole right kidney. Bladder is unremarkable Stomach/Bowel: The stomach is nonenlarged. Wall thickening and mucosal enhancement of the pylorus. Irregular wall thickening of the duodenal bulb with surrounding inflammatory changes, series 5, image 179. Wall thickening second portion of duodenum with surrounding fluid and stranding. Focal gas and fluid collection measuring 1.7 x 1.3 cm on series 5, image 179 posterior to the duodenal bulb which may reflect an ulcer crater or  contained perforation. Remainder of the small bowel is nondistended. Lymphatic: No suspicious lymph nodes Reproductive: Negative prostate Other: Negative for pelvic effusion.  No free gas is visualized. Musculoskeletal: No acute or suspicious osseous abnormality Review of the MIP images confirms the above findings. IMPRESSION: 1. Negative for acute aortic dissection or aneurysm. 2. Wall thickening and mucosal enhancement of the pylorus, first and second portion of duodenum with surrounding inflammatory changes, findings are consistent with gastritis/duodenitis/peptic ulcer disease. Focal gas and fluid collection measuring 1.7 cm posterior to the duodenal bulb which may reflect an ulcer crater or contained perforation. No free gas visualized on this exam. 3. Circumferential mid to distal esophageal thickening, question esophagitis or reflux. Correlate with endoscopy if not already performed 4. Possible surface nodularity of the liver, correlate for cirrhosis. 5. Nonobstructing right kidney stone. Aortic Atherosclerosis (ICD10-I70.0) and Emphysema (ICD10-J43.9). Electronically Signed   By: Jasmine Pang M.D.   On: 03/29/2023 21:18      Signature  -   Susa Raring M.D on 04/02/2023 at 10:07 AM   -  To page go to [email or web address]

## 2023-04-02 NOTE — Plan of Care (Signed)

## 2023-04-02 NOTE — Progress Notes (Signed)
Pt c/o sudden chest pain that woke pt out of his sleep. Pt describes pain as sharp in nature and in  mid chest that extends down into his mid abdomen .  Rates pain as 8/10 in severity. Denies shortness of breath. Provider on call notified , see new orders. Vitals stable.  04/02/23 0359  Vitals  Temp 98.1 F (36.7 C)  Temp Source Oral  BP (!) 137/95  MAP (mmHg) 108  BP Location Right Arm  BP Method Automatic  Patient Position (if appropriate) Lying  Pulse Rate 88  ECG Heart Rate 88  Level of Consciousness  Level of Consciousness Alert  Oxygen Therapy  SpO2 99 %  O2 Device Room Air

## 2023-04-02 NOTE — Progress Notes (Signed)
Subjective: Reports abd pain is improving daily. Wants to eat. Afebrile, WBC normal. Upper GI was done 9/12 morning and did not show any evidence of leak, but there is some narrowing of proximal duodenum.   Objective: Vital signs in last 24 hours: Temp:  [97.7 F (36.5 C)-98.2 F (36.8 C)] 98.2 F (36.8 C) (09/15 0800) Pulse Rate:  [70-95] 76 (09/15 0800) Resp:  [10-18] 12 (09/15 0800) BP: (117-156)/(77-96) 148/83 (09/15 0800) SpO2:  [94 %-100 %] 95 % (09/15 0800) Last BM Date : 04/01/23  Intake/Output from previous day: 09/14 0701 - 09/15 0700 In: 1273.9 [I.V.:1129.8; IV Piggyback:144.1] Out: 750 [Urine:750] Intake/Output this shift: No intake/output data recorded.  PE: Gen: NAD, laying in bed Abd: soft, mild tenderness to palpation in RUQ, no rebound tenderness or guarding.  Lab Results:  Recent Labs    04/01/23 0753 04/02/23 0424  WBC 5.5 5.4  HGB 8.8* 8.1*  HCT 27.2* 25.4*  PLT 112* 112*   BMET Recent Labs    04/01/23 0753 04/02/23 0424  NA 134* 132*  K 2.9* 3.1*  CL 95* 93*  CO2 27 27  GLUCOSE 68* 100*  BUN 11 6*  CREATININE 0.88 0.69  CALCIUM 7.9* 7.9*   PT/INR No results for input(s): "LABPROT", "INR" in the last 72 hours.  CMP     Component Value Date/Time   NA 132 (L) 04/02/2023 0424   K 3.1 (L) 04/02/2023 0424   CL 93 (L) 04/02/2023 0424   CO2 27 04/02/2023 0424   GLUCOSE 100 (H) 04/02/2023 0424   BUN 6 (L) 04/02/2023 0424   CREATININE 0.69 04/02/2023 0424   CALCIUM 7.9 (L) 04/02/2023 0424   PROT 6.3 (L) 04/02/2023 0424   ALBUMIN 2.8 (L) 04/02/2023 0424   AST 27 04/02/2023 0424   ALT 12 04/02/2023 0424   ALKPHOS 24 (L) 04/02/2023 0424   BILITOT 0.8 04/02/2023 0424   GFRNONAA >60 04/02/2023 0424   GFRAA >60 03/29/2020 1502   Lipase     Component Value Date/Time   LIPASE 57 (H) 03/29/2023 1637       Studies/Results: DG CHEST PORT 1 VIEW  Result Date: 04/02/2023 CLINICAL DATA:  Perforated duodenal ulcer.  Chest  pain EXAM: PORTABLE CHEST 1 VIEW COMPARISON:  2 days ago FINDINGS: Normal heart size and mediastinal contours. No acute infiltrate or edema. No effusion or pneumothorax. No acute osseous findings. IMPRESSION: Stable portable chest. Electronically Signed   By: Tiburcio Pea M.D.   On: 04/02/2023 05:12      Assessment/Plan Contained duodenal perforation, likely secondary to ulcer disease -known history of ulcer disease -Continue BID IV PPI and antibiotics -Given increased pain, will get CT to rule out abscess at site of perforation.  If this is negative, would consider fulls today.   -Patient will need outpatient GI follow up for an EGD to rule out an underlying malignancy -Surgery will continue to follow  I reviewed hospitalist notes, last 24 h vitals and pain scores, last 48 h intake and output, last 24 h labs and trends, and last 24 h imaging results.   LOS: 3 days   Check [email or web address] for General Surgery coverage night/weekend/holidays  Page if acute issues. No secure chat available for me given surgeries/clinic/off post call which would lead to a delay in care.  Maudry Diego, MD, FACS, FSSO Surgical Oncology, General Surgery, Trauma and Critical [facility], [place] [phone] for weekday/non holidays Check [email or web address] for coverage night/weekend/holidays  04/02/23 10:20 AM

## 2023-04-03 LAB — CBC WITH DIFFERENTIAL/PLATELET
Abs Immature Granulocytes: 0.03 10*3/uL (ref 0.00–0.07)
Basophils Absolute: 0 10*3/uL (ref 0.0–0.1)
Basophils Relative: 1 %
Eosinophils Absolute: 0.3 10*3/uL (ref 0.0–0.5)
Eosinophils Relative: 6 %
HCT: 28.4 % — ABNORMAL LOW (ref 39.0–52.0)
Hemoglobin: 9.1 g/dL — ABNORMAL LOW (ref 13.0–17.0)
Immature Granulocytes: 1 %
Lymphocytes Relative: 18 %
Lymphs Abs: 1 10*3/uL (ref 0.7–4.0)
MCH: 28 pg (ref 26.0–34.0)
MCHC: 32 g/dL (ref 30.0–36.0)
MCV: 87.4 fL (ref 80.0–100.0)
Monocytes Absolute: 0.8 10*3/uL (ref 0.1–1.0)
Monocytes Relative: 14 %
Neutro Abs: 3.3 10*3/uL (ref 1.7–7.7)
Neutrophils Relative %: 60 %
Platelets: 123 10*3/uL — ABNORMAL LOW (ref 150–400)
RBC: 3.25 MIL/uL — ABNORMAL LOW (ref 4.22–5.81)
RDW: 15.8 % — ABNORMAL HIGH (ref 11.5–15.5)
WBC: 5.5 10*3/uL (ref 4.0–10.5)
nRBC: 0 % (ref 0.0–0.2)

## 2023-04-03 LAB — COMPREHENSIVE METABOLIC PANEL
ALT: 13 U/L (ref 0–44)
AST: 28 U/L (ref 15–41)
Albumin: 2.9 g/dL — ABNORMAL LOW (ref 3.5–5.0)
Alkaline Phosphatase: 28 U/L — ABNORMAL LOW (ref 38–126)
Anion gap: 12 (ref 5–15)
BUN: <5 mg/dL — ABNORMAL LOW (ref 8–23)
CO2: 30 mmol/L (ref 22–32)
Calcium: 8.7 mg/dL — ABNORMAL LOW (ref 8.9–10.3)
Chloride: 94 mmol/L — ABNORMAL LOW (ref 98–111)
Creatinine, Ser: 0.67 mg/dL (ref 0.61–1.24)
GFR, Estimated: >60 mL/min (ref >60)
Glucose, Bld: 124 mg/dL — ABNORMAL HIGH (ref 70–99)
Potassium: 3.6 mmol/L (ref 3.5–5.1)
Sodium: 136 mmol/L (ref 135–145)
Total Bilirubin: 0.5 mg/dL (ref 0.3–1.2)
Total Protein: 6.4 g/dL — ABNORMAL LOW (ref 6.5–8.1)

## 2023-04-03 LAB — PROCALCITONIN: Procalcitonin: <0.1 ng/mL

## 2023-04-03 LAB — MAGNESIUM: Magnesium: 1.7 mg/dL (ref 1.7–2.4)

## 2023-04-03 LAB — TYPE AND SCREEN
ABO/RH(D): A POS
Antibody Screen: NEGATIVE

## 2023-04-03 LAB — PHOSPHORUS: Phosphorus: 2.6 mg/dL (ref 2.5–4.6)

## 2023-04-03 LAB — C-REACTIVE PROTEIN: CRP: 1.5 mg/dL — ABNORMAL HIGH (ref <1.0)

## 2023-04-03 LAB — BRAIN NATRIURETIC PEPTIDE: B Natriuretic Peptide: 76.1 pg/mL (ref 0.0–100.0)

## 2023-04-03 MED ORDER — FOLIC ACID 1 MG PO TABS
1.0000 mg | ORAL_TABLET | Freq: Every day | ORAL | Status: DC
  Administered 2023-04-03 – 2023-04-04 (×2): 1 mg via ORAL
  Filled 2023-04-03 (×2): qty 1

## 2023-04-03 MED ORDER — PANTOPRAZOLE SODIUM 40 MG PO TBEC
40.0000 mg | DELAYED_RELEASE_TABLET | Freq: Two times a day (BID) | ORAL | Status: DC
  Administered 2023-04-03 – 2023-04-04 (×2): 40 mg via ORAL
  Filled 2023-04-03 (×2): qty 1

## 2023-04-03 NOTE — Plan of Care (Signed)

## 2023-04-03 NOTE — Progress Notes (Signed)
PHARMACIST - PHYSICIAN COMMUNICATION  DR:   Thedore Mins  CONCERNING: IV to Oral Route Change Policy  RECOMMENDATION: This patient is receiving thiamin/folic acid/protonix by the intravenous route.  Based on criteria approved by the Pharmacy and Therapeutics Committee, the intravenous medication(s) is/are being converted to the equivalent oral dose form(s).   DESCRIPTION: These criteria include: The patient is eating (either orally or via tube) and/or has been taking other orally administered medications for a least 24 hours The patient has no evidence of active gastrointestinal bleeding or impaired GI absorption (gastrectomy, short bowel, patient on TNA or NPO).  If you have questions about this conversion, please contact the Pharmacy Department  []   [phone] )  [facility] []   [phone] )  [facility] [x]   [phone] )  [facility] []   [phone] )  [facility] []   [phone] )  [facility]

## 2023-04-03 NOTE — Progress Notes (Signed)
PROGRESS NOTE                                                                                                                                                                                                             Patient Demographics:    Christopher Logan, is a 62 y.o. male, DOB - 03-Nov-1960, WNU:[id number]  Outpatient Primary MD for the patient is [name], MD    LOS - 4  Admit date - 03/29/2023    Chief Complaint  Patient presents with   Abdominal Pain   Flank Pain       Brief Narrative (HPI from H&P)     62 y.o. male with medical history significant for PUD, portal HTN gastropathy with last EGD 8/'23, anemia, HTN, compensated cirrhosis, treated Hep C, alcohol abuse, current smoking, who presents with acute onset severe epigastric abd pain x 1 day. Acute and abrupt onset of severe epigastric pain, 10/10, radiating to his back starting on the afternoon of admission.  ER workup suggestive of contained upper GI perforation, surgery was consulted and he was admitted for further care.   Subjective:   Patient in bed appears to be in no distress, denies any headache chest pain, no shortness of breath, abdominal pain considerably improved.   Assessment  & Plan :    Contained duodenal perforation - Acute, severe epigastric abd pain - Rest, IV PPI, general surgery on board, cardiology consulted, being treated conservatively, continue IV fluids, IV antibiotics and monitor clinically.  CT scan repeated on 04/02/2023 which was stable, clinically he is much improved, defer diet advancement and antibiotics to general surgery.  Overall improved.   Anemia, acute on chronic , Thrombocytopenia - has evidence of both iron deficiency and B12 deficiency, replace B12, placed on oral iron once he is better.    Hypokalemia, hypomagnesemia asymptomatic, treating - Replete, monitor   Hyponatremia.  SIADH, stable, currently requiring IV  fluids, monitor.  Alcohol abuse.  Counseled to quit.  CIWA protocol, IV fluids and thiamine and folic acid.  Will place on IV phenobarb as well.   Compensated cirrhosis, Hx HCV, treated  - follows with [facility].  Consulted.  No acute issues   Current smoker: Nicotine patch, counseled to quit.   HTN: - As needed IV hydralazine        Condition - Fair  Family Communication  : Merlene Laughter [phone]  on  03/31/2023  Code Status :  Full  Consults  :  CCS, GI  PUD Prophylaxis : PPI   Procedures  :     Repeat CT 04/02/2023.  Duodenal perforation without any new concerning features.    CTA -  1. Negative for acute aortic dissection or aneurysm. 2. Wall thickening and mucosal enhancement of the pylorus, first and second portion of duodenum with surrounding inflammatory changes, findings are consistent with gastritis/duodenitis/peptic ulcer disease. Focal gas and fluid collection measuring 1.7 cm posterior to the duodenal bulb which may reflect an ulcer crater or contained perforation. No free gas visualized on this exam. 3. Circumferential mid to distal esophageal thickening, question esophagitis or reflux. Correlate with endoscopy if not already performed 4. Possible surface nodularity of the liver, correlate for cirrhosis. 5. Nonobstructing right kidney stone. Aortic Atherosclerosis (ICD10-I70.0) and Emphysema   Barium -  Irregular narrowing of the proximal duodenum consistent with known segment of inflammation. No extravasation of contrast is identified to indicate continued leak      Disposition Plan  :    Status is: Inpatient   DVT Prophylaxis  :    heparin injection 5,000 Units Start: 03/31/23 0915 SCDs Start: 03/30/23 0109    Lab Results  Component Value Date   PLT 123 (L) 04/03/2023    Diet :  Diet Order             Diet full liquid Room service appropriate? Yes; Fluid consistency: Thin  Diet effective now                    Inpatient  Medications  Scheduled Meds:  cyanocobalamin  1,000 mcg Subcutaneous Daily   folic acid  1 mg Intravenous Daily   heparin injection (subcutaneous)  5,000 Units Subcutaneous Q8H   LORazepam  0-4 mg Intravenous Q12H   multivitamin with minerals  1 tablet Oral Daily   nicotine  14 mg Transdermal Daily   pantoprazole (PROTONIX) IV  40 mg Intravenous Q12H   PHENObarbital  32.5 mg Intravenous Q8H   sodium chloride flush  3 mL Intravenous Q12H   thiamine  100 mg Oral Daily   Or   thiamine  100 mg Intravenous Daily   Continuous Infusions:  lactated ringers Stopped (04/02/23 0658)   piperacillin-tazobactam (ZOSYN)  IV 12.5 mL/hr at 04/03/23 0800   PRN Meds:.acetaminophen, hydrALAZINE, HYDROmorphone (DILAUDID) injection **OR** HYDROmorphone (DILAUDID) injection, LORazepam **OR** LORazepam, nitroGLYCERIN, ondansetron (ZOFRAN) IV     Objective:   Vitals:   04/03/23 0000 04/03/23 0400 04/03/23 0753 04/03/23 0800  BP: (!) 142/85 132/80 139/89 (!) 142/80  Pulse: 70 68 76 91  Resp: 15 13  18   Temp: 97.9 F (36.6 C)  (!) 97.5 F (36.4 C)   TempSrc: Oral  Oral   SpO2: 98% 96% 98% 97%  Weight:      Height:        Wt Readings from Last 3 Encounters:  03/30/23 65.8 kg  10/07/22 63.5 kg  02/12/22 62.5 kg     Intake/Output Summary (Last 24 hours) at 04/03/2023 0902 Last data filed at 04/03/2023 0800 Gross per 24 hour  Intake 1104.08 ml  Output 600 ml  Net 504.08 ml     Physical Exam  Awake Alert, No new F.N deficits, minimal DTs [place].AT,PERRAL Supple Neck, No JVD,   Symmetrical Chest wall movement, Good air movement bilaterally, CTAB RRR,No Gallops,Rubs or new Murmurs,  +ve B.Sounds,  Abd Soft, mild generalized tenderness No Cyanosis, Clubbing or edema       Data Review:    Recent Labs  Lab 03/30/23 1217 03/31/23 0625 04/01/23 0753 04/02/23 0424 04/03/23 0554  WBC 7.3 8.2 5.5 5.4 5.5  HGB 8.7* 8.5* 8.8* 8.1* 9.1*  HCT 27.1* 25.7* 27.2* 25.4* 28.4*  PLT 109* 108*  112* 112* 123*  MCV 86.3 88.9 89.5 88.8 87.4  MCH 27.7 29.4 28.9 28.3 28.0  MCHC 32.1 33.1 32.4 31.9 32.0  RDW 16.3* 16.1* 16.1* 15.9* 15.8*  LYMPHSABS  --  1.0 0.9 1.0 1.0  MONOABS  --  1.2* 0.8 0.7 0.8  EOSABS  --  0.2 0.2 0.2 0.3  BASOSABS  --  0.1 0.1 0.1 0.0    Recent Labs  Lab 03/30/23 0251 03/30/23 0716 03/31/23 0625 04/01/23 0753 04/02/23 0424 04/03/23 0554  NA 134*  --  130* 134* 132* 136  K 3.3*  --  3.5 2.9* 3.1* 3.6  CL 98  --  91* 95* 93* 94*  CO2 26  --  29 27 27 30   ANIONGAP 10  --  10 12 12 12   GLUCOSE 95  --  77 68* 100* 124*  BUN 6*  --  7* 11 6* <5*  CREATININE 0.62  --  0.79 0.88 0.69 0.67  AST 34  --  31 32 27 28  ALT 15  --  15 16 12 13   ALKPHOS 26*  --  28* 27* 24* 28*  BILITOT 0.7  --  0.8 1.0 0.8 0.5  ALBUMIN 3.2*  --  3.0* 3.0* 2.8* 2.9*  CRP  --   --  2.1* 3.7* 1.9* 1.5*  PROCALCITON  --   --  <0.10 <0.10 <0.10 <0.10  LATICACIDVEN 1.0 0.9  --   --   --   --   INR 1.1  --   --   --   --   --   BNP  --   --  107.1* 70.2 69.0 76.1  MG  --   --  1.5* 2.1 1.6* 1.7  CALCIUM 8.1*  --  8.0* 7.9* 7.9* 8.7*      Recent Labs  Lab 03/30/23 0251 03/30/23 0716 03/31/23 0625 04/01/23 0753 04/02/23 0424 04/03/23 0554  CRP  --   --  2.1* 3.7* 1.9* 1.5*  PROCALCITON  --   --  <0.10 <0.10 <0.10 <0.10  LATICACIDVEN 1.0 0.9  --   --   --   --   INR 1.1  --   --   --   --   --   BNP  --   --  107.1* 70.2 69.0 76.1  MG  --   --  1.5* 2.1 1.6* 1.7  CALCIUM 8.1*  --  8.0* 7.9* 7.9* 8.7*    --------------------------------------------------------------------------------------------------------------- No results found for: "CHOL", "HDL", "LDLCALC", "LDLDIRECT", "TRIG", "CHOLHDL"  No results found for: "HGBA1C" No results for input(s): "TSH", "T4TOTAL", "FREET4", "T3FREE", "THYROIDAB" in the last 72 hours. No results for input(s): "VITAMINB12", "FOLATE", "FERRITIN", "TIBC", "IRON", "RETICCTPCT" in the last 72 hours.    Radiology Reports CT ABDOMEN  PELVIS W CONTRAST  Result Date: 04/02/2023 CLINICAL DATA:  Contained duodenal perforation EXAM: CT ABDOMEN AND PELVIS WITH CONTRAST TECHNIQUE: Multidetector CT imaging of the abdomen and pelvis was performed using the standard protocol following bolus administration of intravenous contrast. RADIATION DOSE REDUCTION: This exam was performed according to the departmental dose-optimization program which includes automated exposure control, adjustment of  the mA and/or kV according to patient size and/or use of iterative reconstruction technique. CONTRAST:  75mL OMNIPAQUE IOHEXOL 350 MG/ML SOLN COMPARISON:  CTA abdomen/pelvis dated 03/29/2023 FINDINGS: Lower chest: Lung bases are clear. Hepatobiliary: Liver is within normal limits. Gallbladder is unremarkable. No intrahepatic or extrahepatic duct dilatation. Pancreas: Within normal limits. Spleen: Within normal limits. Adrenals/Urinary Tract: Adrenal glands are within normal limits. Two nonobstructing right lower pole renal calculi measuring up to 5 mm (series 3/image 44). 8 mm anterior left upper pole renal cyst (series 3/image 33), measuring simple fluid density, benign (Bosniak I). No follow-up recommended. No hydronephrosis. Thick-walled bladder, although underdistended. Stomach/Bowel: Stomach is within normal limits. Prior duodenal wall thickening is improved, without residual inflammatory changes. Suspected small duodenal ulcer (series 3/image 35). No adjacent fluid collection to suggest abscess. No free air to suggest macroscopic perforation. No evidence of bowel obstruction. Normal appendix (series 3/image 3). No colonic wall thickening or inflammatory changes. Vascular/Lymphatic: No evidence of abdominal aortic aneurysm. Atherosclerotic calcifications of the abdominal aorta and branch vessels, although vessels remain patent. No suspicious abdominopelvic lymphadenopathy. Reproductive: Prostate is unremarkable. Other: No abdominopelvic ascites. Mild scattered  subcutaneous gas along the anterior abdominal wall (for example, series 3/image 49), likely iatrogenic. Musculoskeletal: Degenerative changes of the visualized thoracolumbar spine. IMPRESSION: Suspected small duodenal ulcer. Associated duodenal wall thickening/inflammatory changes have resolved. No findings suspicious for perforation or abscess. Nonobstructing right lower pole renal calculi measuring up to 5 mm. No hydronephrosis. Electronically Signed   By: Charline Bills M.D.   On: 04/02/2023 17:20   DG CHEST PORT 1 VIEW  Result Date: 04/02/2023 CLINICAL DATA:  Perforated duodenal ulcer.  Chest pain EXAM: PORTABLE CHEST 1 VIEW COMPARISON:  2 days ago FINDINGS: Normal heart size and mediastinal contours. No acute infiltrate or edema. No effusion or pneumothorax. No acute osseous findings. IMPRESSION: Stable portable chest. Electronically Signed   By: Tiburcio Pea M.D.   On: 04/02/2023 05:12   DG Chest Port 1 View  Result Date: 03/31/2023 CLINICAL DATA:  Shortness of breath. EXAM: PORTABLE CHEST 1 VIEW COMPARISON:  07/09/2022 FINDINGS: The lungs are clear without focal pneumonia, edema, pneumothorax or pleural effusion. The cardiopericardial silhouette is within normal limits for size. No acute bony abnormality. Telemetry leads overlie the chest. IMPRESSION: No active disease. Electronically Signed   By: Kennith Center M.D.   On: 03/31/2023 07:54   DG Abd Portable 1V  Result Date: 03/31/2023 CLINICAL DATA:  Abdominal pain. EXAM: PORTABLE ABDOMEN - 1 VIEW COMPARISON:  CT scan 03/29/2023.  Upper GI series 03/30/2023 FINDINGS: No gaseous small bowel dilatation. Contrast material is distributed along the length of a nondilated colon. Degenerative changes are noted in the lumbar spine. IMPRESSION: Nonobstructive bowel gas pattern. Electronically Signed   By: Kennith Center M.D.   On: 03/31/2023 07:54   DG UGI W SINGLE CM (SOL OR THIN BA)  Result Date: 03/30/2023 CLINICAL DATA:  62 year old male with  history of EtOH use presents with concern for perforated duodenal ulcer. EXAM: DG UGI W SINGLE CM TECHNIQUE: Scout radiograph was obtained. Single contrast examination was performed using Omnipaque 300. This exam was performed by Loyce Dys PA-C, and was supervised and interpreted by Acquanetta Belling, MD. FLUOROSCOPY: Radiation Exposure Index (as provided by the fluoroscopic device): 54.4 mGy Kerma COMPARISON:  CT ANGIO CHEST ABD PELV 03/29/23. FINDINGS: Scout radiograph with normal bowel gas pattern. No significant abnormality of the distal esophagus. Adequate filling of the stomach with 100 mL Omnipaque 300. No  evidence of gastric ulcers or lesions. Contrast empties appropriately into the duodenal bulb without evidence of extravasation. The proximal duodenum is irregularly narrowed. Contrast fills the remaining portion of the duodenum without evidence of extravasation. IMPRESSION: Irregular narrowing of the proximal duodenum consistent with known segment of inflammation. No extravasation of contrast is identified to indicate continued leak. Correlation with endoscopy is recommended when patient condition allows to exclude underlying mass. Electronically Signed   By: Acquanetta Belling M.D.   On: 03/30/2023 12:40      Signature  -   Susa Raring M.D on 04/03/2023 at 9:02 AM   -  To page go to [email or web address]

## 2023-04-03 NOTE — Progress Notes (Signed)
Subjective: Reports abd pain is improving daily. Tolerating FLD. BMx1 yesterday    Objective: Vital signs in last 24 hours: Temp:  [97.5 F (36.4 C)-98.2 F (36.8 C)] 97.5 F (36.4 C) (09/16 0753) Pulse Rate:  [68-98] 91 (09/16 0800) Resp:  [13-18] 18 (09/16 0800) BP: (132-142)/(80-89) 142/80 (09/16 0800) SpO2:  [96 %-99 %] 97 % (09/16 0800) Last BM Date : 04/02/23  Intake/Output from previous day: 09/15 0701 - 09/16 0700 In: 1406.7 [P.O.:340; I.V.:861.4; IV Piggyback:205.3] Out: 400 [Urine:400] Intake/Output this shift: Total I/O In: 37.4 [I.V.:25; IV Piggyback:12.4] Out: 200 [Urine:200]  PE: Gen: NAD, laying in bed Abd: soft, exam deferred, patient on the commode   Lab Results:  Recent Labs    04/02/23 0424 04/03/23 0554  WBC 5.4 5.5  HGB 8.1* 9.1*  HCT 25.4* 28.4*  PLT 112* 123*   BMET Recent Labs    04/02/23 0424 04/03/23 0554  NA 132* 136  K 3.1* 3.6  CL 93* 94*  CO2 27 30  GLUCOSE 100* 124*  BUN 6* <5*  CREATININE 0.69 0.67  CALCIUM 7.9* 8.7*   PT/INR No results for input(s): "LABPROT", "INR" in the last 72 hours.  CMP     Component Value Date/Time   NA 136 04/03/2023 0554   K 3.6 04/03/2023 0554   CL 94 (L) 04/03/2023 0554   CO2 30 04/03/2023 0554   GLUCOSE 124 (H) 04/03/2023 0554   BUN <5 (L) 04/03/2023 0554   CREATININE 0.67 04/03/2023 0554   CALCIUM 8.7 (L) 04/03/2023 0554   PROT 6.4 (L) 04/03/2023 0554   ALBUMIN 2.9 (L) 04/03/2023 0554   AST 28 04/03/2023 0554   ALT 13 04/03/2023 0554   ALKPHOS 28 (L) 04/03/2023 0554   BILITOT 0.5 04/03/2023 0554   GFRNONAA >60 04/03/2023 0554   GFRAA >60 03/29/2020 1502   Lipase     Component Value Date/Time   LIPASE 57 (H) 03/29/2023 1637       Studies/Results: CT ABDOMEN PELVIS W CONTRAST  Result Date: 04/02/2023 CLINICAL DATA:  Contained duodenal perforation EXAM: CT ABDOMEN AND PELVIS WITH CONTRAST TECHNIQUE: Multidetector CT imaging of the abdomen and pelvis was  performed using the standard protocol following bolus administration of intravenous contrast. RADIATION DOSE REDUCTION: This exam was performed according to the departmental dose-optimization program which includes automated exposure control, adjustment of the mA and/or kV according to patient size and/or use of iterative reconstruction technique. CONTRAST:  75mL OMNIPAQUE IOHEXOL 350 MG/ML SOLN COMPARISON:  CTA abdomen/pelvis dated 03/29/2023 FINDINGS: Lower chest: Lung bases are clear. Hepatobiliary: Liver is within normal limits. Gallbladder is unremarkable. No intrahepatic or extrahepatic duct dilatation. Pancreas: Within normal limits. Spleen: Within normal limits. Adrenals/Urinary Tract: Adrenal glands are within normal limits. Two nonobstructing right lower pole renal calculi measuring up to 5 mm (series 3/image 44). 8 mm anterior left upper pole renal cyst (series 3/image 33), measuring simple fluid density, benign (Bosniak I). No follow-up recommended. No hydronephrosis. Thick-walled bladder, although underdistended. Stomach/Bowel: Stomach is within normal limits. Prior duodenal wall thickening is improved, without residual inflammatory changes. Suspected small duodenal ulcer (series 3/image 35). No adjacent fluid collection to suggest abscess. No free air to suggest macroscopic perforation. No evidence of bowel obstruction. Normal appendix (series 3/image 3). No colonic wall thickening or inflammatory changes. Vascular/Lymphatic: No evidence of abdominal aortic aneurysm. Atherosclerotic calcifications of the abdominal aorta and branch vessels, although vessels remain patent. No suspicious abdominopelvic lymphadenopathy. Reproductive: Prostate is unremarkable. Other: No  abdominopelvic ascites. Mild scattered subcutaneous gas along the anterior abdominal wall (for example, series 3/image 49), likely iatrogenic. Musculoskeletal: Degenerative changes of the visualized thoracolumbar spine. IMPRESSION: Suspected  small duodenal ulcer. Associated duodenal wall thickening/inflammatory changes have resolved. No findings suspicious for perforation or abscess. Nonobstructing right lower pole renal calculi measuring up to 5 mm. No hydronephrosis. Electronically Signed   By: Charline Bills M.D.   On: 04/02/2023 17:20   DG CHEST PORT 1 VIEW  Result Date: 04/02/2023 CLINICAL DATA:  Perforated duodenal ulcer.  Chest pain EXAM: PORTABLE CHEST 1 VIEW COMPARISON:  2 days ago FINDINGS: Normal heart size and mediastinal contours. No acute infiltrate or edema. No effusion or pneumothorax. No acute osseous findings. IMPRESSION: Stable portable chest. Electronically Signed   By: Tiburcio Pea M.D.   On: 04/02/2023 05:12      Assessment/Plan Contained duodenal perforation, likely secondary to ulcer disease -known history of ulcer disease -Continue BID IV PPI and antibiotics - UGI 9/12 without evidence of leak  - CT yesterday 9/15 without evidence of abscess - continue FLD.  -Patient will need outpatient GI follow up for an EGD to rule out an underlying malignancy -Surgery will continue to follow  I reviewed hospitalist notes, last 24 h vitals and pain scores, last 48 h intake and output, last 24 h labs and trends, and last 24 h imaging results.   LOS: 4 days   Check [email or web address] for General Surgery coverage night/weekend/holidays Hosie Spangle, [facility], [place] [phone] for weekday/non holidays Check [email or web address] for coverage night/weekend/holidays    04/03/23 2:39 PM

## 2023-04-03 NOTE — Progress Notes (Signed)
Mobility Specialist Progress Note:   04/03/23 1130  Mobility  Activity Ambulated with assistance in hallway;Ambulated with assistance to bathroom  Level of Assistance Contact guard assist, steadying assist  Assistive Device Front wheel walker  Distance Ambulated (ft) 250 ft  Activity Response Tolerated well  Mobility Referral Yes  $Mobility charge 1 Mobility  Mobility Specialist Start Time (ACUTE ONLY) 1130  Mobility Specialist Stop Time (ACUTE ONLY) 1150  Mobility Specialist Time Calculation (min) (ACUTE ONLY) 20 min   Pt agreeable to mobility session. Required occasional steadying assist and assist with steering. C/o minor L knee pain. Back in bed with all needs met.    Addison Lank Mobility Specialist Please contact via SecureChat or  Rehab office at [phone]

## 2023-04-04 ENCOUNTER — Other Ambulatory Visit (HOSPITAL_COMMUNITY): Payer: Self-pay

## 2023-04-04 LAB — CBC WITH DIFFERENTIAL/PLATELET
Abs Immature Granulocytes: 0.08 10*3/uL — ABNORMAL HIGH (ref 0.00–0.07)
Basophils Absolute: 0.1 10*3/uL (ref 0.0–0.1)
Basophils Relative: 1 %
Eosinophils Absolute: 0.3 10*3/uL (ref 0.0–0.5)
Eosinophils Relative: 5 %
HCT: 27.2 % — ABNORMAL LOW (ref 39.0–52.0)
Hemoglobin: 8.6 g/dL — ABNORMAL LOW (ref 13.0–17.0)
Immature Granulocytes: 1 %
Lymphocytes Relative: 22 %
Lymphs Abs: 1.3 10*3/uL (ref 0.7–4.0)
MCH: 27.9 pg (ref 26.0–34.0)
MCHC: 31.6 g/dL (ref 30.0–36.0)
MCV: 88.3 fL (ref 80.0–100.0)
Monocytes Absolute: 0.8 10*3/uL (ref 0.1–1.0)
Monocytes Relative: 15 %
Neutro Abs: 3.2 10*3/uL (ref 1.7–7.7)
Neutrophils Relative %: 56 %
Platelets: 114 10*3/uL — ABNORMAL LOW (ref 150–400)
RBC: 3.08 MIL/uL — ABNORMAL LOW (ref 4.22–5.81)
RDW: 15.9 % — ABNORMAL HIGH (ref 11.5–15.5)
WBC: 5.8 10*3/uL (ref 4.0–10.5)
nRBC: 0 % (ref 0.0–0.2)

## 2023-04-04 LAB — C-REACTIVE PROTEIN: CRP: 1 mg/dL — ABNORMAL HIGH (ref <1.0)

## 2023-04-04 LAB — COMPREHENSIVE METABOLIC PANEL
ALT: 15 U/L (ref 0–44)
AST: 27 U/L (ref 15–41)
Albumin: 2.9 g/dL — ABNORMAL LOW (ref 3.5–5.0)
Alkaline Phosphatase: 27 U/L — ABNORMAL LOW (ref 38–126)
Anion gap: 5 (ref 5–15)
BUN: <5 mg/dL — ABNORMAL LOW (ref 8–23)
CO2: 29 mmol/L (ref 22–32)
Calcium: 8.8 mg/dL — ABNORMAL LOW (ref 8.9–10.3)
Chloride: 99 mmol/L (ref 98–111)
Creatinine, Ser: 0.68 mg/dL (ref 0.61–1.24)
GFR, Estimated: >60 mL/min (ref >60)
Glucose, Bld: 91 mg/dL (ref 70–99)
Potassium: 3.7 mmol/L (ref 3.5–5.1)
Sodium: 133 mmol/L — ABNORMAL LOW (ref 135–145)
Total Bilirubin: 0.4 mg/dL (ref 0.3–1.2)
Total Protein: 6.4 g/dL — ABNORMAL LOW (ref 6.5–8.1)

## 2023-04-04 LAB — MAGNESIUM: Magnesium: 1.6 mg/dL — ABNORMAL LOW (ref 1.7–2.4)

## 2023-04-04 MED ORDER — PANTOPRAZOLE SODIUM 40 MG PO TBEC
40.0000 mg | DELAYED_RELEASE_TABLET | Freq: Two times a day (BID) | ORAL | 1 refills | Status: AC
  Filled 2023-04-04: qty 60, 30d supply, fill #0

## 2023-04-04 MED ORDER — NICOTINE 21 MG/24HR TD PT24
21.0000 mg | MEDICATED_PATCH | Freq: Every day | TRANSDERMAL | 0 refills | Status: AC
  Filled 2023-04-04: qty 21, 21d supply, fill #0

## 2023-04-04 MED ORDER — AMLODIPINE BESYLATE 10 MG PO TABS
10.0000 mg | ORAL_TABLET | Freq: Every day | ORAL | 0 refills | Status: AC
  Filled 2023-04-04: qty 30, 30d supply, fill #0

## 2023-04-04 MED ORDER — AMOXICILLIN-POT CLAVULANATE 875-125 MG PO TABS
1.0000 | ORAL_TABLET | Freq: Two times a day (BID) | ORAL | 0 refills | Status: AC
  Filled 2023-04-04: qty 10, 5d supply, fill #0

## 2023-04-04 MED ORDER — HYDROCODONE-ACETAMINOPHEN 5-325 MG PO TABS
1.0000 | ORAL_TABLET | Freq: Two times a day (BID) | ORAL | 0 refills | Status: AC | PRN
  Filled 2023-04-04: qty 10, 5d supply, fill #0

## 2023-04-04 MED ORDER — ONDANSETRON HCL 4 MG PO TABS
4.0000 mg | ORAL_TABLET | Freq: Three times a day (TID) | ORAL | 0 refills | Status: AC | PRN
  Filled 2023-04-04: qty 18, 6d supply, fill #0

## 2023-04-04 MED ORDER — THIAMINE HCL 100 MG PO TABS
100.0000 mg | ORAL_TABLET | Freq: Every day | ORAL | 0 refills | Status: AC
  Filled 2023-04-04: qty 30, 30d supply, fill #0

## 2023-04-04 MED ORDER — FOLIC ACID 1 MG PO TABS
1.0000 mg | ORAL_TABLET | Freq: Every day | ORAL | 0 refills | Status: AC
  Filled 2023-04-04: qty 30, 30d supply, fill #0

## 2023-04-04 MED ORDER — FERROUS SULFATE 325 (65 FE) MG PO TABS
325.0000 mg | ORAL_TABLET | Freq: Two times a day (BID) | ORAL | 0 refills | Status: AC
  Filled 2023-04-04: qty 100, 50d supply, fill #0

## 2023-04-04 MED ORDER — VITAMIN B-12 1000 MCG PO TABS
1000.0000 ug | ORAL_TABLET | Freq: Every day | ORAL | 0 refills | Status: AC
  Filled 2023-04-04: qty 30, 30d supply, fill #0

## 2023-04-04 MED ORDER — HYDROCODONE-ACETAMINOPHEN 5-325 MG PO TABS
1.0000 | ORAL_TABLET | Freq: Four times a day (QID) | ORAL | Status: DC | PRN
  Administered 2023-04-04: 1 via ORAL
  Filled 2023-04-04: qty 1

## 2023-04-04 NOTE — Discharge Summary (Signed)
Christopher Logan NWG:[id number] DOB: Jul 23, 1960 DOA: 03/29/2023  PCP: [name], MD  Admit date: 03/29/2023  Discharge date: 04/04/2023  Admitted From: Home   Disposition:  Home   Recommendations for Outpatient Follow-up:   Follow up with PCP in 1-2 weeks  PCP Please obtain BMP/CBC, 2 view CXR in 1week,  (see Discharge instructions)   PCP Please follow up on the following pending results:    Home Health: None   Equipment/Devices: None  Consultations: CCS Discharge Condition: Stable    CODE STATUS: Full    Diet Recommendation: Soft consistency diet for the next 4 to 5 days then gradually advance to regular consistency heart healthy as tolerated.    Chief Complaint  Patient presents with   Abdominal Pain   Flank Pain     Brief history of present illness from the day of admission and additional interim summary    62 y.o. male with medical history significant for PUD, portal HTN gastropathy with last EGD 8/'23, anemia, HTN, compensated cirrhosis, treated Hep C, alcohol abuse, current smoking, who presents with acute onset severe epigastric abd pain x 1 day. Acute and abrupt onset of severe epigastric pain, 10/10, radiating to his back starting on the afternoon of admission.  ER workup suggestive of contained upper GI perforation, surgery was consulted and he was admitted for further care.                                                                  Hospital Course   Contained duodenal perforation - Acute, severe epigastric abd pain - Rest, IV PPI, general surgery on board, cardiology consulted, being treated conservatively, continue IV fluids, IV antibiotics and monitor clinically.  CT scan repeated on 04/02/2023 which was stable, clinically he is much improved, defer diet advancement and antibiotics to  general surgery.  Overall improved.   Anemia, acute on chronic , Thrombocytopenia - has evidence of both iron deficiency and B12 deficiency, replace B12, also placed on oral iron and folic acid.  PCP to monitor anemia profile outpatient with outpatient workup in conjunction with GI.    Hypokalemia, hypomagnesemia asymptomatic, treating - Repleted, stable here, PCP to monitor   Hyponatremia.  SIADH, resolved.   Alcohol abuse.  Counseled to quit.  CIWA protocol, had DTs earlier now resolved completely.  Counseled to abstain from alcohol.   Compensated cirrhosis, Hx HCV, treated  - follows with [facility]  Post discharge follow-up with  GI.  PCP to arrange.   Current smoker: Nicotine patch, counseled to quit.   HTN: -Stable placed on oral Norvasc upon discharge.      Principal Problem:   Duodenal perforation (HCC) Active Problems:   Peptic ulcer disease   Thrombocytopenia (HCC)   Hypokalemia   Normocytic anemia    Discharge  instructions    Discharge Instructions     Ambulatory Referral for Lung Cancer Scre   Complete by: As directed    Ambulatory referral to Physical Therapy   Complete by: As directed    Outpatient PT add OT if needed   Discharge instructions   Complete by: As directed    Follow with Primary MD [name], MD in 7 days   Get CBC, CMP, Magnesium, 2 view Chest X ray -  checked next visit with your primary MD   Activity: As tolerated with Full fall precautions use walker/cane & assistance as needed  Disposition Home    Diet: Soft consistency diet for the next 4 to 5 days then gradually advance to regular consistency heart healthy as tolerated.  Special Instructions: If you have smoked or chewed Tobacco  in the last 2 yrs please stop smoking, stop any regular Alcohol  and or any Recreational drug use.  On your next visit with your primary care physician please Get Medicines reviewed and adjusted.  Please request your Prim.MD to go over all  Hospital Tests and Procedure/Radiological results at the follow up, please get all Hospital records sent to your Prim MD by signing hospital release before you go home.  If you experience worsening of your admission symptoms, develop shortness of breath, life threatening emergency, suicidal or homicidal thoughts you must seek medical attention immediately by calling 911 or calling your MD immediately  if symptoms less severe.  You Must read complete instructions/literature along with all the possible adverse reactions/side effects for all the Medicines you take and that have been prescribed to you. Take any new Medicines after you have completely understood and accpet all the possible adverse reactions/side effects.   Do not drive when taking Pain medications.  Do not take more than prescribed Pain, Sleep and Anxiety Medications   Increase activity slowly   Complete by: As directed        Discharge Medications   Allergies as of 04/04/2023       Reactions   Bee Venom Anaphylaxis   Lisinopril Swelling   Caused facial swelling   Codeine Nausea Only        Medication List     STOP taking these medications    lisinopril 40 MG tablet Commonly known as: ZESTRIL       TAKE these medications    amLODipine 10 MG tablet Commonly known as: NORVASC Take 1 tablet (10 mg total) by mouth daily. What changed:  medication strength how much to take   amoxicillin-clavulanate 875-125 MG tablet Commonly known as: AUGMENTIN Take 1 tablet by mouth 2 (two) times daily.   cyanocobalamin 1000 MCG tablet Commonly known as: VITAMIN B12 Take 1 tablet (1,000 mcg total) by mouth daily.   ferrous sulfate 325 (65 FE) MG EC tablet Take 1 tablet (325 mg total) by mouth 2 (two) times daily.   folic acid 1 MG tablet Commonly known as: FOLVITE Take 1 tablet (1 mg total) by mouth daily.   HYDROcodone-acetaminophen 5-325 MG tablet Commonly known as: NORCO/VICODIN Take 1 tablet by mouth every 12  (twelve) hours as needed for severe pain.   nicotine 21 mg/24hr patch Commonly known as: NICODERM CQ - dosed in mg/24 hours Place 1 patch (21 mg total) onto the skin daily.   ondansetron 4 MG tablet Commonly known as: Zofran Take 1 tablet (4 mg total) by mouth every 8 (eight) hours as needed for nausea or vomiting.   pantoprazole 40 MG  tablet Commonly known as: Protonix Take 1 tablet (40 mg total) by mouth 2 (two) times daily.   thiamine 100 MG tablet Commonly known as: Vitamin B-1 Take 1 tablet (100 mg total) by mouth daily.         Follow-up Information     Charlott Rakes, MD. Schedule an appointment as soon as possible for a visit in 2 month(s).   Specialty: Gastroenterology Why: Follow-up on perforated duodenal ulcer Contact information: [address] [place] [address] [phone]         [facility] at [facility] Follow up.   Specialty: Rehabilitation Why: For outpatient PT they will call you to set up services Contact information: [address] [place] [address] [phone]        [name], MD. Schedule an appointment as soon as possible for a visit in 1 week(s).   Specialty: Family Medicine Contact information: [address] [place] [address] [phone]         [facility] [place]. Schedule an appointment as soon as possible for a visit in 1 week(s).   Specialty: General Surgery Contact information: [address] [place] [address] [phone]        Napoleon Form, MD. Schedule an appointment as soon as possible for a visit in 1 month(s).   Specialty: Gastroenterology Contact information: [address] [place] [address] [phone]                 Major procedures and Radiology Reports - PLEASE review detailed and final reports thoroughly  -       CT  ABDOMEN PELVIS W CONTRAST  Result Date: 04/02/2023 CLINICAL DATA:  Contained duodenal perforation EXAM: CT ABDOMEN AND PELVIS WITH CONTRAST TECHNIQUE: Multidetector CT imaging of the abdomen and pelvis was performed using the standard protocol following bolus administration of intravenous contrast. RADIATION DOSE REDUCTION: This exam was performed according to the departmental dose-optimization program which includes automated exposure control, adjustment of the mA and/or kV according to patient size and/or use of iterative reconstruction technique. CONTRAST:  75mL OMNIPAQUE IOHEXOL 350 MG/ML SOLN COMPARISON:  CTA abdomen/pelvis dated 03/29/2023 FINDINGS: Lower chest: Lung bases are clear. Hepatobiliary: Liver is within normal limits. Gallbladder is unremarkable. No intrahepatic or extrahepatic duct dilatation. Pancreas: Within normal limits. Spleen: Within normal limits. Adrenals/Urinary Tract: Adrenal glands are within normal limits. Two nonobstructing right lower pole renal calculi measuring up to 5 mm (series 3/image 44). 8 mm anterior left upper pole renal cyst (series 3/image 33), measuring simple fluid density, benign (Bosniak I). No follow-up recommended. No hydronephrosis. Thick-walled bladder, although underdistended. Stomach/Bowel: Stomach is within normal limits. Prior duodenal wall thickening is improved, without residual inflammatory changes. Suspected small duodenal ulcer (series 3/image 35). No adjacent fluid collection to suggest abscess. No free air to suggest macroscopic perforation. No evidence of bowel obstruction. Normal appendix (series 3/image 3). No colonic wall thickening or inflammatory changes. Vascular/Lymphatic: No evidence of abdominal aortic aneurysm. Atherosclerotic calcifications of the abdominal aorta and branch vessels, although vessels remain patent. No suspicious abdominopelvic lymphadenopathy. Reproductive: Prostate is unremarkable. Other: No abdominopelvic ascites. Mild  scattered subcutaneous gas along the anterior abdominal wall (for example, series 3/image 49), likely iatrogenic. Musculoskeletal: Degenerative changes of the visualized thoracolumbar spine. IMPRESSION: Suspected small duodenal ulcer. Associated duodenal wall thickening/inflammatory changes have resolved. No findings suspicious for perforation or abscess. Nonobstructing right lower pole renal calculi measuring  up to 5 mm. No hydronephrosis. Electronically Signed   By: Charline Bills M.D.   On: 04/02/2023 17:20   DG CHEST PORT 1 VIEW  Result Date: 04/02/2023 CLINICAL DATA:  Perforated duodenal ulcer.  Chest pain EXAM: PORTABLE CHEST 1 VIEW COMPARISON:  2 days ago FINDINGS: Normal heart size and mediastinal contours. No acute infiltrate or edema. No effusion or pneumothorax. No acute osseous findings. IMPRESSION: Stable portable chest. Electronically Signed   By: Tiburcio Pea M.D.   On: 04/02/2023 05:12   DG Chest Port 1 View  Result Date: 03/31/2023 CLINICAL DATA:  Shortness of breath. EXAM: PORTABLE CHEST 1 VIEW COMPARISON:  07/09/2022 FINDINGS: The lungs are clear without focal pneumonia, edema, pneumothorax or pleural effusion. The cardiopericardial silhouette is within normal limits for size. No acute bony abnormality. Telemetry leads overlie the chest. IMPRESSION: No active disease. Electronically Signed   By: Kennith Center M.D.   On: 03/31/2023 07:54   DG Abd Portable 1V  Result Date: 03/31/2023 CLINICAL DATA:  Abdominal pain. EXAM: PORTABLE ABDOMEN - 1 VIEW COMPARISON:  CT scan 03/29/2023.  Upper GI series 03/30/2023 FINDINGS: No gaseous small bowel dilatation. Contrast material is distributed along the length of a nondilated colon. Degenerative changes are noted in the lumbar spine. IMPRESSION: Nonobstructive bowel gas pattern. Electronically Signed   By: Kennith Center M.D.   On: 03/31/2023 07:54   DG UGI W SINGLE CM (SOL OR THIN BA)  Result Date: 03/30/2023 CLINICAL DATA:  62 year old  male with history of EtOH use presents with concern for perforated duodenal ulcer. EXAM: DG UGI W SINGLE CM TECHNIQUE: Scout radiograph was obtained. Single contrast examination was performed using Omnipaque 300. This exam was performed by Loyce Dys PA-C, and was supervised and interpreted by Acquanetta Belling, MD. FLUOROSCOPY: Radiation Exposure Index (as provided by the fluoroscopic device): 54.4 mGy Kerma COMPARISON:  CT ANGIO CHEST ABD PELV 03/29/23. FINDINGS: Scout radiograph with normal bowel gas pattern. No significant abnormality of the distal esophagus. Adequate filling of the stomach with 100 mL Omnipaque 300. No evidence of gastric ulcers or lesions. Contrast empties appropriately into the duodenal bulb without evidence of extravasation. The proximal duodenum is irregularly narrowed. Contrast fills the remaining portion of the duodenum without evidence of extravasation. IMPRESSION: Irregular narrowing of the proximal duodenum consistent with known segment of inflammation. No extravasation of contrast is identified to indicate continued leak. Correlation with endoscopy is recommended when patient condition allows to exclude underlying mass. Electronically Signed   By: Acquanetta Belling M.D.   On: 03/30/2023 12:40   CT Angio Chest/Abd/Pel for Dissection W and/or Wo Contrast  Result Date: 03/29/2023 CLINICAL DATA:  Emesis flank pain EXAM: CT ANGIOGRAPHY CHEST, ABDOMEN AND PELVIS TECHNIQUE: CT 02/11/2022 Multidetector CT imaging through the chest, abdomen and pelvis was performed using the standard protocol during bolus administration of intravenous contrast. Multiplanar reconstructed images and MIPs were obtained and reviewed to evaluate the vascular anatomy. RADIATION DOSE REDUCTION: This exam was performed according to the departmental dose-optimization program which includes automated exposure control, adjustment of the mA and/or kV according to patient size and/or use of iterative reconstruction technique.  CONTRAST:  OMNIPAQUE IOHEXOL 350 MG/ML SOLN COMPARISON:  CT 02/11/2022 FINDINGS: CTA CHEST FINDINGS Cardiovascular: Non contrasted images of the chest demonstrate no acute intramural hematoma. Moderate aortic atherosclerosis. No aneurysm. No dissection. Coronary vascular calcification. Normal cardiac size. No pericardial effusion. Mediastinum/Nodes: Midline trachea. No thyroid mass. No suspicious lymph nodes. Subcentimeter AP window lymph nodes.  Circumferential mid to distal esophageal thickening. Lungs/Pleura: No consolidation or effusion. Minimal emphysema. Minimal ground-glass density within the right greater than left lung bases. Musculoskeletal: No acute osseous abnormality. Probable old sternal fracture Review of the MIP images confirms the above findings. CTA ABDOMEN AND PELVIS FINDINGS VASCULAR Aorta: Normal caliber aorta without aneurysm, dissection, vasculitis or significant stenosis. Moderate aortic atherosclerosis. Celiac: Patent without evidence of aneurysm, dissection, vasculitis or significant stenosis. Origin calcification. SMA: Patent without evidence of aneurysm, dissection, vasculitis or significant stenosis. Origin calcification. Renals: Single right and single left renal arteries with origin calcification but no stenosis or dissection. 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 IMA: Diminutive but patent. Inflow: Moderate severe atherosclerosis. Mild narrowing of the right internal iliac artery origin. External iliac arteries are patent. Veins: No obvious venous abnormality within the limitations of this arterial phase study. Review of the MIP images confirms the above findings. NON-VASCULAR Hepatobiliary: No calcified gallstone. No biliary dilatation. Possible surface nodularity of the liver. Pancreas: Unremarkable. No pancreatic ductal dilatation or surrounding inflammatory changes. Spleen: Normal in size without focal abnormality.  Adrenals/Urinary Tract: Adrenal glands are normal. No hydronephrosis. 7 mm stone lower pole right kidney. Bladder is unremarkable Stomach/Bowel: The stomach is nonenlarged. Wall thickening and mucosal enhancement of the pylorus. Irregular wall thickening of the duodenal bulb with surrounding inflammatory changes, series 5, image 179. Wall thickening second portion of duodenum with surrounding fluid and stranding. Focal gas and fluid collection measuring 1.7 x 1.3 cm on series 5, image 179 posterior to the duodenal bulb which may reflect an ulcer crater or contained perforation. Remainder of the small bowel is nondistended. Lymphatic: No suspicious lymph nodes Reproductive: Negative prostate Other: Negative for pelvic effusion.  No free gas is visualized. Musculoskeletal: No acute or suspicious osseous abnormality Review of the MIP images confirms the above findings. IMPRESSION: 1. Negative for acute aortic dissection or aneurysm. 2. Wall thickening and mucosal enhancement of the pylorus, first and second portion of duodenum with surrounding inflammatory changes, findings are consistent with gastritis/duodenitis/peptic ulcer disease. Focal gas and fluid collection measuring 1.7 cm posterior to the duodenal bulb which may reflect an ulcer crater or contained perforation. No free gas visualized on this exam. 3. Circumferential mid to distal esophageal thickening, question esophagitis or reflux. Correlate with endoscopy if not already performed 4. Possible surface nodularity of the liver, correlate for cirrhosis. 5. Nonobstructing right kidney stone. Aortic Atherosclerosis (ICD10-I70.0) and Emphysema (ICD10-J43.9). Electronically Signed   By: Jasmine Pang M.D.   On: 03/29/2023 21:18    Micro Results    No results found for this or any previous visit (from the past 240 hour(s)).  Today   Subjective    Christopher Logan today has no headache,no chest abdominal pain,no new weakness tingling or numbness, feels much  better wants to go home today.    Objective   Blood pressure 130/80, pulse 76, temperature 97.8 F (36.6 C), temperature source Oral, resp. rate 12, height 5\' 9"  (1.753 m), weight 65.8 kg, SpO2 98%.   Intake/Output Summary (Last 24 hours) at 04/04/2023 0809 Last data filed at 04/04/2023 0200 Gross per 24 hour  Intake 71.34 ml  Output 2300 ml  Net -2228.66 ml    Exam  Awake Alert, No new F.N deficits,    [place].AT,PERRAL Supple Neck,   Symmetrical Chest wall movement, Good air movement bilaterally, CTAB RRR,No Gallops,   +ve B.Sounds, Abd Soft, Non tender,  No Cyanosis, Clubbing or  edema    Data Review   Recent Labs  Lab 03/31/23 0625 04/01/23 0753 04/02/23 0424 04/03/23 0554 04/04/23 0504  WBC 8.2 5.5 5.4 5.5 5.8  HGB 8.5* 8.8* 8.1* 9.1* 8.6*  HCT 25.7* 27.2* 25.4* 28.4* 27.2*  PLT 108* 112* 112* 123* 114*  MCV 88.9 89.5 88.8 87.4 88.3  MCH 29.4 28.9 28.3 28.0 27.9  MCHC 33.1 32.4 31.9 32.0 31.6  RDW 16.1* 16.1* 15.9* 15.8* 15.9*  LYMPHSABS 1.0 0.9 1.0 1.0 1.3  MONOABS 1.2* 0.8 0.7 0.8 0.8  EOSABS 0.2 0.2 0.2 0.3 0.3  BASOSABS 0.1 0.1 0.1 0.0 0.1    Recent Labs  Lab 03/30/23 0251 03/30/23 0716 03/31/23 0625 04/01/23 0753 04/02/23 0424 04/03/23 0554 04/04/23 0504  NA 134*  --  130* 134* 132* 136 133*  K 3.3*  --  3.5 2.9* 3.1* 3.6 3.7  CL 98  --  91* 95* 93* 94* 99  CO2 26  --  29 27 27 30 29   ANIONGAP 10  --  10 12 12 12 5   GLUCOSE 95  --  77 68* 100* 124* 91  BUN 6*  --  7* 11 6* <5* <5*  CREATININE 0.62  --  0.79 0.88 0.69 0.67 0.68  AST 34  --  31 32 27 28 27   ALT 15  --  15 16 12 13 15   ALKPHOS 26*  --  28* 27* 24* 28* 27*  BILITOT 0.7  --  0.8 1.0 0.8 0.5 0.4  ALBUMIN 3.2*  --  3.0* 3.0* 2.8* 2.9* 2.9*  CRP  --   --  2.1* 3.7* 1.9* 1.5* 1.0*  PROCALCITON  --   --  <0.10 <0.10 <0.10 <0.10  --   LATICACIDVEN 1.0 0.9  --   --   --   --   --   INR 1.1  --   --   --   --   --   --   BNP  --   --  107.1* 70.2 69.0 76.1  --   MG  --   --  1.5* 2.1  1.6* 1.7 1.6*  CALCIUM 8.1*  --  8.0* 7.9* 7.9* 8.7* 8.8*    Total Time in preparing paper work, data evaluation and todays exam - 35 minutes  Signature  -    Susa Raring M.D on 04/04/2023 at 8:09 AM   -  To page go to [email or web address]

## 2023-04-04 NOTE — TOC Transition Note (Addendum)
Transition of Care [facility]) - CM/SW Discharge Note   Patient Details  Name: Christopher Logan MRN: [id number] Date of Birth: 05-31-1961  [facility] CM/SW Contact:  Gordy Clement, RN Phone Number: 04/04/2023, 9:19 AM   Clinical Narrative:     Patient to dc to home today  OP therapy has been arranged  AVS updated with information  Sister or friend to transport home .  No additional TOC needs       Barriers to Discharge: Continued Medical Work up   Patient Goals and CMS Choice      Discharge Placement                         Discharge Plan and Services Additional resources added to the After Visit Summary for   In-house Referral: Clinical Social Work                                   Social Determinants of Health (SDOH) Interventions SDOH Screenings   Food Insecurity: No Food Insecurity (03/30/2023)  Housing: Low Risk  (03/30/2023)  Transportation Needs: No Transportation Needs (03/30/2023)  Utilities: Not At Risk (03/30/2023)  Tobacco Use: Medium Risk (03/30/2023)     Readmission Risk Interventions    02/15/2022    9:57 AM  Readmission Risk Prevention Plan  Post Dischage Appt Complete  Medication Screening Complete  Transportation Screening Complete

## 2023-04-04 NOTE — Progress Notes (Signed)
AVS reviewed with pt by Boyd Kerbs, RN. Pt stated he will drive himself home, and attending physician Dr. Thedore Mins made aware. Pt left unit in NAD.

## 2023-04-04 NOTE — Progress Notes (Signed)
Physical Therapy Treatment Patient Details Name: Christopher Logan MRN: [id number] DOB: [birth year] Today's Date: 04/04/2023   History of Present Illness Pt is a 62 y/o M admitted on 03/29/23 after presenting with c/o acute onset of severe epigastric abdominal pain x 1 day. Pt found to have duodenal perforation, being treated conservatively. PMH: PUD, portal HTN, gastropathy, anemia, HTN, compensated cirrhosis, Hep C, alcohol abuse, smoker    PT Comments  Continuing work on functional mobility and activity tolerance;  Session focused on gait with 4 wheeled RW to see if this AD would be useful for pt; Overall he did well with it, but chooses not to ask for one ot be ordered at this time; Reinforced that he needs to use his regular RW for more stability wit amb   If plan is discharge home, recommend the following: Assistance with cooking/housework   Can travel by private vehicle        Equipment Recommendations  None recommended by PT    Recommendations for Other Services       Precautions / Restrictions Precautions Precautions: Fall Restrictions Weight Bearing Restrictions: No     Mobility  Bed Mobility                    Transfers     Transfers: Sit to/from Stand Sit to Stand: Modified independent (Device/Increase time)           General transfer comment: Stood from recliner; dependent on UEs to push off and steady; practiced sit<>stand to adn from rollator RW    Ambulation/Gait Ambulation/Gait assistance: Supervision, Modified independent (Device/Increase time) Gait Distance (Feet): 500 Feet Assistive device: Rollator (4 wheels) Gait Pattern/deviations: Step-through pattern, Decreased stride length       General Gait Details: Walked with 4 wheeled RW adn smoother steps with bil UE support   Stairs             Wheelchair Mobility     Tilt Bed    Modified Rankin (Stroke Patients Only)       Balance     Sitting balance-Leahy Scale: Good        Standing balance-Leahy Scale: Poor                              Cognition Arousal: Alert Behavior During Therapy: WFL for tasks assessed/performed Overall Cognitive Status: Within Functional Limits for tasks assessed                                          Exercises      General Comments General comments (skin integrity, edema, etc.): no lightheadedness reported      Pertinent Vitals/Pain Pain Assessment Pain Assessment: No/denies pain    Home Living                          Prior Function            PT Goals (current goals can now be found in the care plan section) Acute Rehab PT Goals Patient Stated Goal: get better, return to PLOF PT Goal Formulation: With patient Time For Goal Achievement: 04/14/23 Potential to Achieve Goals: Good Progress towards PT goals: Progressing toward goals    Frequency    Min 1X/week      PT Plan  Co-evaluation              AM-PAC PT "6 Clicks" Mobility   Outcome Measure  Help needed turning from your back to your side while in a flat bed without using bedrails?: None Help needed moving from lying on your back to sitting on the side of a flat bed without using bedrails?: None Help needed moving to and from a bed to a chair (including a wheelchair)?: None Help needed standing up from a chair using your arms (e.g., wheelchair or bedside chair)?: A Little Help needed to walk in hospital room?: A Little Help needed climbing 3-5 steps with a railing? : A Little 6 Click Score: 21    End of Session   Activity Tolerance: Patient tolerated treatment well Patient left: Other (comment) (in wheelchair with NT assisting him) Nurse Communication: Mobility status PT Visit Diagnosis: Unsteadiness on feet (R26.81);History of falling (Z91.81)     Time: 1220-1239 PT Time Calculation (min) (ACUTE ONLY): 19 min  Charges:    $Gait Training: 8-22 mins PT General Charges $$ ACUTE  PT VISIT: 1 Visit                     Van Clines, PT  Acute Rehabilitation Services Office [phone] Secure Chat welcomed'   [name] 04/04/2023, 3:01 PM

## 2023-04-04 NOTE — Plan of Care (Signed)

## 2023-04-04 NOTE — Discharge Instructions (Signed)
Follow with Primary MD [name], MD in 7 days   Get CBC, CMP, Magnesium, 2 view Chest X ray -  checked next visit with your primary MD   Activity: As tolerated with Full fall precautions use walker/cane & assistance as needed  Disposition Home    Diet: Soft consistency diet for the next 4 to 5 days then gradually advance to regular consistency heart healthy as tolerated.  Special Instructions: If you have smoked or chewed Tobacco  in the last 2 yrs please stop smoking, stop any regular Alcohol  and or any Recreational drug use.  On your next visit with your primary care physician please Get Medicines reviewed and adjusted.  Please request your Prim.MD to go over all Hospital Tests and Procedure/Radiological results at the follow up, please get all Hospital records sent to your Prim MD by signing hospital release before you go home.  If you experience worsening of your admission symptoms, develop shortness of breath, life threatening emergency, suicidal or homicidal thoughts you must seek medical attention immediately by calling 911 or calling your MD immediately  if symptoms less severe.  You Must read complete instructions/literature along with all the possible adverse reactions/side effects for all the Medicines you take and that have been prescribed to you. Take any new Medicines after you have completely understood and accpet all the possible adverse reactions/side effects.   Do not drive when taking Pain medications.  Do not take more than prescribed Pain, Sleep and Anxiety Medications

## 2023-04-04 NOTE — Progress Notes (Signed)
Subjective: No abdominal pain. Eating well with no issues.  Already been discharged.  Objective: Vital signs in last 24 hours: Temp:  [97.8 F (36.6 C)] 97.8 F (36.6 C) (09/17 0000) Pulse Rate:  [76-97] 76 (09/17 0000) Resp:  [12-19] 12 (09/17 0400) BP: (130-147)/(80-91) 130/80 (09/17 0400) SpO2:  [98 %-100 %] 98 % (09/17 0000) Last BM Date : 04/04/23  Intake/Output from previous day: 09/16 0701 - 09/17 0700 In: 108.8 [I.V.:25; IV Piggyback:83.8] Out: 2500 [Urine:2500] Intake/Output this shift: No intake/output data recorded.  PE: Gen: NAD Abd: soft, NT, ND  Lab Results:  Recent Labs    04/03/23 0554 04/04/23 0504  WBC 5.5 5.8  HGB 9.1* 8.6*  HCT 28.4* 27.2*  PLT 123* 114*   BMET Recent Labs    04/03/23 0554 04/04/23 0504  NA 136 133*  K 3.6 3.7  CL 94* 99  CO2 30 29  GLUCOSE 124* 91  BUN <5* <5*  CREATININE 0.67 0.68  CALCIUM 8.7* 8.8*   PT/INR No results for input(s): "LABPROT", "INR" in the last 72 hours.  CMP     Component Value Date/Time   NA 133 (L) 04/04/2023 0504   K 3.7 04/04/2023 0504   CL 99 04/04/2023 0504   CO2 29 04/04/2023 0504   GLUCOSE 91 04/04/2023 0504   BUN <5 (L) 04/04/2023 0504   CREATININE 0.68 04/04/2023 0504   CALCIUM 8.8 (L) 04/04/2023 0504   PROT 6.4 (L) 04/04/2023 0504   ALBUMIN 2.9 (L) 04/04/2023 0504   AST 27 04/04/2023 0504   ALT 15 04/04/2023 0504   ALKPHOS 27 (L) 04/04/2023 0504   BILITOT 0.4 04/04/2023 0504   GFRNONAA >60 04/04/2023 0504   GFRAA >60 03/29/2020 1502   Lipase     Component Value Date/Time   LIPASE 57 (H) 03/29/2023 1637       Studies/Results: CT ABDOMEN PELVIS W CONTRAST  Result Date: 04/02/2023 CLINICAL DATA:  Contained duodenal perforation EXAM: CT ABDOMEN AND PELVIS WITH CONTRAST TECHNIQUE: Multidetector CT imaging of the abdomen and pelvis was performed using the standard protocol following bolus administration of intravenous contrast. RADIATION DOSE REDUCTION: This exam  was performed according to the departmental dose-optimization program which includes automated exposure control, adjustment of the mA and/or kV according to patient size and/or use of iterative reconstruction technique. CONTRAST:  75mL OMNIPAQUE IOHEXOL 350 MG/ML SOLN COMPARISON:  CTA abdomen/pelvis dated 03/29/2023 FINDINGS: Lower chest: Lung bases are clear. Hepatobiliary: Liver is within normal limits. Gallbladder is unremarkable. No intrahepatic or extrahepatic duct dilatation. Pancreas: Within normal limits. Spleen: Within normal limits. Adrenals/Urinary Tract: Adrenal glands are within normal limits. Two nonobstructing right lower pole renal calculi measuring up to 5 mm (series 3/image 44). 8 mm anterior left upper pole renal cyst (series 3/image 33), measuring simple fluid density, benign (Bosniak I). No follow-up recommended. No hydronephrosis. Thick-walled bladder, although underdistended. Stomach/Bowel: Stomach is within normal limits. Prior duodenal wall thickening is improved, without residual inflammatory changes. Suspected small duodenal ulcer (series 3/image 35). No adjacent fluid collection to suggest abscess. No free air to suggest macroscopic perforation. No evidence of bowel obstruction. Normal appendix (series 3/image 3). No colonic wall thickening or inflammatory changes. Vascular/Lymphatic: No evidence of abdominal aortic aneurysm. Atherosclerotic calcifications of the abdominal aorta and branch vessels, although vessels remain patent. No suspicious abdominopelvic lymphadenopathy. Reproductive: Prostate is unremarkable. Other: No abdominopelvic ascites. Mild scattered subcutaneous gas along the anterior abdominal wall (for example, series 3/image 49), likely iatrogenic. Musculoskeletal:  Degenerative changes of the visualized thoracolumbar spine. IMPRESSION: Suspected small duodenal ulcer. Associated duodenal wall thickening/inflammatory changes have resolved. No findings suspicious for  perforation or abscess. Nonobstructing right lower pole renal calculi measuring up to 5 mm. No hydronephrosis. Electronically Signed   By: Charline Bills M.D.   On: 04/02/2023 17:20      Assessment/Plan Contained duodenal perforation, likely secondary to ulcer disease -known history of ulcer disease -Continue BID PPI and antibiotics - UGI 9/12 without evidence of leak  - CT yesterday 9/15 without evidence of abscess - tolerating soft diet - surgically stable for DC home  -Patient will need outpatient GI follow up for an EGD to rule out an underlying malignancy  I reviewed hospitalist notes, last 24 h vitals and pain scores, last 48 h intake and output, last 24 h labs and trends, and last 24 h imaging results.   LOS: 5 days   Letha Cape, PA-C General Surgery [facility], [place] [phone] for weekday/non holidays Check [email or web address] for coverage night/weekend/holidays    04/04/23 1:08 PM

## 2023-04-17 ENCOUNTER — Other Ambulatory Visit: Payer: Self-pay

## 2023-04-17 ENCOUNTER — Emergency Department (HOSPITAL_COMMUNITY)
Admission: EM | Admit: 2023-04-17 | Discharge: 2023-04-17 | Disposition: A | Discharge status: Home / Self Care | Payer: Self-pay | Attending: Emergency Medicine | Admitting: Emergency Medicine

## 2023-04-17 ENCOUNTER — Encounter (HOSPITAL_COMMUNITY): Payer: Self-pay

## 2023-04-17 DIAGNOSIS — R21 Rash and other nonspecific skin eruption: Secondary | ICD-10-CM | POA: Insufficient documentation

## 2023-04-17 MED ORDER — PREDNISONE 20 MG PO TABS: 40.0000 mg | ORAL_TABLET | Freq: Every day | ORAL | 0 refills | Status: AC

## 2023-04-17 MED ORDER — CETIRIZINE HCL 10 MG PO TABS: 10.0000 mg | ORAL_TABLET | Freq: Every day | ORAL | 0 refills | Status: AC

## 2023-04-17 NOTE — ED Triage Notes (Signed)
Pt c/o itching on feet bilat, arms bilat, back and head started yesterday. Pt has scattered large raised red bumps on feet, ankles arms bilat and chest.

## 2023-04-17 NOTE — ED Provider Notes (Signed)
[place] EMERGENCY DEPARTMENT AT [facility] Provider Note   CSN: [id number] Arrival date & time: 04/17/23  1400     History  Chief Complaint  Patient presents with   Rash    Christopher Logan is a 62 y.o. male.   Rash   61 year old male presents emergency department with complaints of rash.  Patient reports rash beginning yesterday.  States that he was out in the yard in tall grass mowing and weed eating prior to symptom onset.  Has been trying Benadryl as well as cortisone cream without improvement.  Now reports rash on bilateral lower legs, arms, chest, back.  Denies any fever, difficulty breathing/swallowing.  Describes rash as itchy without pain.  Past medical history significant for alcohol abuse, peptic ulcer disease, thrombocytopenia  Home Medications Prior to Admission medications   Medication Sig Start Date End Date Taking? Authorizing Provider  cetirizine (ZYRTEC ALLERGY) 10 MG tablet Take 1 tablet (10 mg total) by mouth daily. 04/17/23  Yes Sherian Maroon A, PA  predniSONE (DELTASONE) 20 MG tablet Take 2 tablets (40 mg total) by mouth daily with breakfast for 6 days. 04/17/23 04/23/23 Yes Sherian Maroon A, PA  amLODipine (NORVASC) 10 MG tablet Take 1 tablet (10 mg total) by mouth daily. 04/04/23   Leroy Sea, MD  amoxicillin-clavulanate (AUGMENTIN) 875-125 MG tablet Take 1 tablet by mouth 2 (two) times daily. 04/04/23   Leroy Sea, MD  cyanocobalamin (VITAMIN B12) 1000 MCG tablet Take 1 tablet (1,000 mcg total) by mouth daily. 04/04/23   Leroy Sea, MD  ferrous sulfate 325 (65 FE) MG tablet Take 1 tablet (325 mg total) by mouth 2 (two) times daily. 04/04/23 04/03/24  Leroy Sea, MD  folic acid (FOLVITE) 1 MG tablet Take 1 tablet (1 mg total) by mouth daily. 04/04/23   Leroy Sea, MD  HYDROcodone-acetaminophen (NORCO/VICODIN) 5-325 MG tablet Take 1 tablet by mouth every 12 (twelve) hours as needed for severe pain. 04/04/23   Leroy Sea, MD  nicotine (NICODERM CQ - DOSED IN MG/24 HOURS) 21 mg/24hr patch Place 1 patch (21 mg total) onto the skin daily. 04/04/23   Leroy Sea, MD  ondansetron (ZOFRAN) 4 MG tablet Take 1 tablet (4 mg total) by mouth every 8 (eight) hours as needed for nausea or vomiting. 04/04/23   Leroy Sea, MD  pantoprazole (PROTONIX) 40 MG tablet Take 1 tablet (40 mg total) by mouth 2 (two) times daily. 04/04/23   Leroy Sea, MD  thiamine (VITAMIN B1) 100 MG tablet Take 1 tablet (100 mg total) by mouth daily. 04/04/23   Leroy Sea, MD      Allergies    Bee venom, Lisinopril, and Codeine    Review of Systems   Review of Systems  Skin:  Positive for rash.  All other systems reviewed and are negative.   Physical Exam Updated Vital Signs BP 126/78   Pulse 95   Temp 98.1 F (36.7 C) (Oral)   Resp 16   Ht 5\' 9"  (1.753 m)   Wt 65.8 kg   SpO2 97%   BMI 21.42 kg/m  Physical Exam Vitals and nursing note reviewed.  Constitutional:      General: He is not in acute distress.    Appearance: He is well-developed.  HENT:     Head: Normocephalic and atraumatic.  Eyes:     Conjunctiva/sclera: Conjunctivae normal.  Cardiovascular:     Rate and Rhythm: Normal rate  and regular rhythm.  Pulmonary:     Effort: Pulmonary effort is normal. No respiratory distress.  Abdominal:     Palpations: Abdomen is soft.     Tenderness: There is no abdominal tenderness.  Musculoskeletal:        General: No swelling.     Cervical back: Neck supple.  Skin:    General: Skin is warm and dry.     Capillary Refill: Capillary refill takes less than 2 seconds.     Comments: Patient with raised maculovesicular rash with minimal surrounding erythema with multiple areas noted on bilateral lower extremities.  Areas appreciated on bilateral forearms, chest and back.  Neurological:     Mental Status: He is alert.  Psychiatric:        Mood and Affect: Mood normal.     ED Results / Procedures  / Treatments   Labs (all labs ordered are listed, but only abnormal results are displayed) Labs Reviewed - No data to display  EKG None  Radiology No results found.  Procedures Procedures    Medications Ordered in ED Medications - No data to display  ED Course/ Medical Decision Making/ A&P                                 Medical Decision Making Risk OTC drugs. Prescription drug management.   This patient presents to the ED for concern of rash, this involves an extensive number of treatment options, and is a complaint that carries with it a high risk of complications and morbidity.  The differential diagnosis includes contact dermatitis, SJS/TE N, cellulitis, erysipelas, necrotizing fashion, vasculitis, other   Co morbidities that complicate the patient evaluation  See HPI   Additional history obtained:  Additional history obtained from EMR External records from outside source obtained and reviewed including hospital records   Lab Tests:  N/a   Imaging Studies ordered:  N/a   Cardiac Monitoring: / EKG:  The patient was maintained on a cardiac monitor.  I personally viewed and interpreted the cardiac monitored which showed an underlying rhythm of: Sinus rhythm   Consultations Obtained:  N/a   Problem List / ED Course / Critical interventions / Medication management  Rash Reevaluation of the patient showed that the patient stayed the same I have reviewed the patients home medicines and have made adjustments as needed   Social Determinants of Health:  Chronic cigarette use.  Chronic alcohol use.  Denies illicit drug use.   Test / Admission - Considered:  Rash Vitals signs within normal range and stable throughout visit. 62 year old male presents emergency department with complaints of rash.  Rash began yesterday after being out in the yard mowing tall grass.  Rash consistent with allergic contact dermatitis.  Patient with trial of topical  corticosteroids without improvement.  Given multi body part involvement of rash as well as symptoms refractory to topical corticosteroids, will place patient on prednisone and recommend continued antihistamine use in the outpatient setting.  Close follow-up with primary care recommended for reevaluation.  Treatment plan discussed at length with patient and he acknowledged understand was agreeable to said plan.  Patient overall well-appearing, febrile in no acute distress. Worrisome signs and symptoms were discussed with the patient, and the patient acknowledged understanding to return to the ED if noticed. Patient was stable upon discharge.          Final Clinical Impression(s) / ED Diagnoses Final diagnoses:  Rash    Rx / DC Orders ED Discharge Orders          Ordered    predniSONE (DELTASONE) 20 MG tablet  Daily with breakfast        04/17/23 1458    cetirizine (ZYRTEC ALLERGY) 10 MG tablet  Daily        04/17/23 1458              Peter Garter, [place] 04/17/23 2215    Pricilla Loveless, MD 04/19/23 1244

## 2023-04-17 NOTE — Discharge Instructions (Addendum)
As discussed, workup today overall reassuring.  Will place you on oral prednisone given most likely cause of rash is allergic contact dermatitis.  Will recommend use of Zyrtec to help with itch in the morning as well as Benadryl as needed at night.  He may find over-the-counter Benadryl cream beneficial along with Aveeno lotion.  Recommend follow-up with primary care for reassessment.  Please not hesitate to return to emergency department for worrisome signs and symptoms we discussed become apparent.

## 2024-05-16 ENCOUNTER — Other Ambulatory Visit: Payer: Self-pay

## 2024-05-16 ENCOUNTER — Emergency Department (HOSPITAL_COMMUNITY)
Admission: EM | Admit: 2024-05-16 | Discharge: 2024-05-16 | Disposition: A | Discharge status: Home / Self Care | Payer: Self-pay | Attending: Emergency Medicine | Admitting: Emergency Medicine

## 2024-05-16 ENCOUNTER — Emergency Department (HOSPITAL_COMMUNITY): Payer: Self-pay

## 2024-05-16 DIAGNOSIS — M25512 Pain in left shoulder: Secondary | ICD-10-CM | POA: Diagnosis not present

## 2024-05-16 DIAGNOSIS — I2510 Atherosclerotic heart disease of native coronary artery without angina pectoris: Secondary | ICD-10-CM

## 2024-05-16 DIAGNOSIS — Z79899 Other long term (current) drug therapy: Secondary | ICD-10-CM | POA: Diagnosis not present

## 2024-05-16 DIAGNOSIS — R0600 Dyspnea, unspecified: Secondary | ICD-10-CM | POA: Diagnosis not present

## 2024-05-16 DIAGNOSIS — S92321A Displaced fracture of second metatarsal bone, right foot, initial encounter for closed fracture: Secondary | ICD-10-CM

## 2024-05-16 DIAGNOSIS — R0789 Other chest pain: Secondary | ICD-10-CM | POA: Insufficient documentation

## 2024-05-16 DIAGNOSIS — M25511 Pain in right shoulder: Secondary | ICD-10-CM | POA: Insufficient documentation

## 2024-05-16 DIAGNOSIS — S92351A Displaced fracture of fifth metatarsal bone, right foot, initial encounter for closed fracture: Secondary | ICD-10-CM

## 2024-05-16 DIAGNOSIS — M546 Pain in thoracic spine: Secondary | ICD-10-CM | POA: Diagnosis not present

## 2024-05-16 DIAGNOSIS — Y92410 Unspecified street and highway as the place of occurrence of the external cause: Secondary | ICD-10-CM | POA: Diagnosis not present

## 2024-05-16 DIAGNOSIS — I6523 Occlusion and stenosis of bilateral carotid arteries: Secondary | ICD-10-CM

## 2024-05-16 DIAGNOSIS — R079 Chest pain, unspecified: Secondary | ICD-10-CM | POA: Diagnosis present

## 2024-05-16 DIAGNOSIS — R202 Paresthesia of skin: Secondary | ICD-10-CM | POA: Insufficient documentation

## 2024-05-16 DIAGNOSIS — I10 Essential (primary) hypertension: Secondary | ICD-10-CM | POA: Insufficient documentation

## 2024-05-16 LAB — CBC WITH DIFFERENTIAL/PLATELET
Abs Immature Granulocytes: 0.06 K/uL (ref 0.00–0.07)
Basophils Absolute: 0.1 K/uL (ref 0.0–0.1)
Basophils Relative: 2 %
Eosinophils Absolute: 0.2 K/uL (ref 0.0–0.5)
Eosinophils Relative: 5 %
HCT: 39.3 % (ref 39.0–52.0)
Hemoglobin: 13.9 g/dL (ref 13.0–17.0)
Immature Granulocytes: 1 %
Lymphocytes Relative: 22 %
Lymphs Abs: 1 K/uL (ref 0.7–4.0)
MCH: 34.7 pg — ABNORMAL HIGH (ref 26.0–34.0)
MCHC: 35.4 g/dL (ref 30.0–36.0)
MCV: 98 fL (ref 80.0–100.0)
Monocytes Absolute: 0.6 K/uL (ref 0.1–1.0)
Monocytes Relative: 12 %
Neutro Abs: 2.7 K/uL (ref 1.7–7.7)
Neutrophils Relative %: 58 %
Platelets: 93 K/uL — ABNORMAL LOW (ref 150–400)
RBC: 4.01 MIL/uL — ABNORMAL LOW (ref 4.22–5.81)
RDW: 12.9 % (ref 11.5–15.5)
WBC: 4.6 K/uL (ref 4.0–10.5)
nRBC: 0 % (ref 0.0–0.2)

## 2024-05-16 LAB — COMPREHENSIVE METABOLIC PANEL WITH GFR
ALT: 22 U/L (ref 0–44)
AST: 46 U/L — ABNORMAL HIGH (ref 15–41)
Albumin: 3.6 g/dL (ref 3.5–5.0)
Alkaline Phosphatase: 26 U/L — ABNORMAL LOW (ref 38–126)
Anion gap: 16 — ABNORMAL HIGH (ref 5–15)
BUN: <5 mg/dL — ABNORMAL LOW (ref 8–23)
CO2: 24 mmol/L (ref 22–32)
Calcium: 8.7 mg/dL — ABNORMAL LOW (ref 8.9–10.3)
Chloride: 99 mmol/L (ref 98–111)
Creatinine, Ser: 0.72 mg/dL (ref 0.61–1.24)
GFR, Estimated: >60 mL/min (ref >60)
Glucose, Bld: 100 mg/dL — ABNORMAL HIGH (ref 70–99)
Potassium: 3.6 mmol/L (ref 3.5–5.1)
Sodium: 139 mmol/L (ref 135–145)
Total Bilirubin: 0.5 mg/dL (ref 0.0–1.2)
Total Protein: 6.9 g/dL (ref 6.5–8.1)

## 2024-05-16 LAB — TYPE AND SCREEN
ABO/RH(D): A POS
Antibody Screen: NEGATIVE

## 2024-05-16 LAB — PROTIME-INR
INR: 1 (ref 0.8–1.2)
Prothrombin Time: 13.3 s (ref 11.4–15.2)

## 2024-05-16 LAB — TROPONIN I (HIGH SENSITIVITY)
Troponin I (High Sensitivity): 11 ng/L (ref <18)
Troponin I (High Sensitivity): 11 ng/L (ref <18)

## 2024-05-16 LAB — ETHANOL: Alcohol, Ethyl (B): 28 mg/dL — ABNORMAL HIGH (ref <15)

## 2024-05-16 MED ORDER — FENTANYL CITRATE (PF) 50 MCG/ML IJ SOSY
12.5000 ug | PREFILLED_SYRINGE | Freq: Once | INTRAMUSCULAR | Status: AC
  Administered 2024-05-16: 12.5 ug via INTRAVENOUS
  Filled 2024-05-16: qty 1

## 2024-05-16 MED ORDER — LACTATED RINGERS IV BOLUS
500.0000 mL | Freq: Once | INTRAVENOUS | Status: AC
  Administered 2024-05-16: 500 mL via INTRAVENOUS

## 2024-05-16 MED ORDER — IOHEXOL 350 MG/ML SOLN
75.0000 mL | Freq: Once | INTRAVENOUS | Status: AC | PRN
Start: 2024-05-16 — End: 2024-05-16
  Administered 2024-05-16: 75 mL via INTRAVENOUS

## 2024-05-16 MED ORDER — ONDANSETRON HCL 4 MG/2ML IJ SOLN
4.0000 mg | Freq: Once | INTRAMUSCULAR | Status: AC
  Administered 2024-05-16: 4 mg via INTRAVENOUS
  Filled 2024-05-16: qty 2

## 2024-05-16 MED ORDER — MORPHINE SULFATE (PF) 2 MG/ML IV SOLN
2.0000 mg | Freq: Once | INTRAVENOUS | Status: AC
  Administered 2024-05-16: 2 mg via INTRAVENOUS
  Filled 2024-05-16: qty 1
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
## 2024-05-16 NOTE — Progress Notes (Signed)
 Orthopedic Tech Progress Note Patient Details:  Christopher Logan [birth year] [id number]  Ortho Devices Type of Ortho Device: Postop shoe/boot Ortho Device/Splint Location: RLE Ortho Device/Splint Interventions: Application, Ordered, Adjustment   Post Interventions Patient Tolerated: Well Instructions Provided: Care of device, Adjustment of device  Guila Owensby F Danira Nylander 05/16/2024, 12:59 PM
